# Patient Record
Sex: Female | Born: 1955
Health system: Southern US, Community
[De-identification: ages and names within clinical notes are randomized; demographics above are authoritative.]

## PROBLEM LIST (undated history)

## (undated) DIAGNOSIS — M1990 Unspecified osteoarthritis, unspecified site: Secondary | ICD-10-CM

## (undated) DIAGNOSIS — K635 Polyp of colon: Secondary | ICD-10-CM

## (undated) DIAGNOSIS — I10 Essential (primary) hypertension: Secondary | ICD-10-CM

## (undated) DIAGNOSIS — Z8041 Family history of malignant neoplasm of ovary: Secondary | ICD-10-CM

## (undated) HISTORY — DX: Essential (primary) hypertension: I10

## (undated) HISTORY — PX: OTHER SURGICAL HISTORY: SHX169

## (undated) HISTORY — PX: GALLBLADDER SURGERY: SHX652

## (undated) HISTORY — DX: Family history of malignant neoplasm of ovary: Z80.41

## (undated) HISTORY — PX: CHOLECYSTECTOMY: SHX55

---

## 2004-08-16 ENCOUNTER — Ambulatory Visit: Payer: Self-pay

## 2005-06-09 HISTORY — PX: COLONOSCOPY: SHX174

## 2005-08-29 ENCOUNTER — Ambulatory Visit: Payer: Self-pay

## 2006-08-31 ENCOUNTER — Ambulatory Visit: Payer: Self-pay

## 2006-10-05 ENCOUNTER — Ambulatory Visit: Payer: Self-pay | Admitting: Gastroenterology

## 2006-10-05 LAB — HM COLONOSCOPY: HM Colonoscopy: NORMAL

## 2007-09-02 ENCOUNTER — Ambulatory Visit: Payer: Self-pay

## 2008-09-04 ENCOUNTER — Ambulatory Visit: Payer: Self-pay

## 2009-09-05 ENCOUNTER — Ambulatory Visit: Payer: Self-pay

## 2009-09-06 ENCOUNTER — Ambulatory Visit: Payer: Self-pay

## 2010-09-09 ENCOUNTER — Ambulatory Visit: Payer: Self-pay

## 2011-09-03 ENCOUNTER — Ambulatory Visit: Payer: Self-pay | Admitting: Family Medicine

## 2011-09-03 LAB — URINALYSIS, COMPLETE
Blood: NEGATIVE
Glucose,UR: NEGATIVE mg/dL (ref 0–75)
Ketone: NEGATIVE
Leukocyte Esterase: NEGATIVE
Nitrite: NEGATIVE
Ph: 5 (ref 4.5–8.0)
Specific Gravity: 1.025 (ref 1.003–1.030)

## 2011-09-05 ENCOUNTER — Ambulatory Visit: Payer: Self-pay | Admitting: Family Medicine

## 2011-09-05 LAB — HEPATIC FUNCTION PANEL A (ARMC)
Albumin: 4.2 g/dL (ref 3.4–5.0)
Alkaline Phosphatase: 95 U/L (ref 50–136)
Bilirubin, Direct: 0.1 mg/dL (ref 0.00–0.20)
Bilirubin,Total: 0.4 mg/dL (ref 0.2–1.0)
SGOT(AST): 16 U/L (ref 15–37)
SGPT (ALT): 22 U/L
Total Protein: 7.7 g/dL (ref 6.4–8.2)

## 2011-09-05 LAB — CBC WITH DIFFERENTIAL/PLATELET
Basophil #: 0 10*3/uL (ref 0.0–0.1)
Basophil %: 0.6 %
HCT: 39.7 % (ref 35.0–47.0)
Lymphocyte %: 35.3 %
MCH: 26.8 pg (ref 26.0–34.0)
Monocyte #: 0.4 10*3/uL (ref 0.0–0.7)
Neutrophil %: 54.3 %
RBC: 4.87 10*6/uL (ref 3.80–5.20)

## 2011-09-06 ENCOUNTER — Observation Stay: Payer: Self-pay | Admitting: Surgery

## 2011-09-06 LAB — CBC
HGB: 12.8 g/dL (ref 12.0–16.0)
MCH: 27.2 pg (ref 26.0–34.0)
MCHC: 33.2 g/dL (ref 32.0–36.0)
MCV: 82 fL (ref 80–100)
Platelet: 359 10*3/uL (ref 150–440)
RBC: 4.71 10*6/uL (ref 3.80–5.20)

## 2011-09-06 LAB — URINALYSIS, COMPLETE
Bilirubin,UR: NEGATIVE
Glucose,UR: NEGATIVE mg/dL (ref 0–75)
Ketone: NEGATIVE
Leukocyte Esterase: NEGATIVE
Nitrite: NEGATIVE
Protein: NEGATIVE
RBC,UR: <1 /HPF (ref 0–5)
Specific Gravity: 1.031 (ref 1.003–1.030)
Squamous Epithelial: 2
WBC UR: <1 /HPF (ref 0–5)

## 2011-09-06 LAB — COMPREHENSIVE METABOLIC PANEL
Albumin: 4.2 g/dL (ref 3.4–5.0)
Alkaline Phosphatase: 76 U/L (ref 50–136)
Bilirubin,Total: 0.2 mg/dL (ref 0.2–1.0)
Calcium, Total: 9.3 mg/dL (ref 8.5–10.1)
Chloride: 106 mmol/L (ref 98–107)
Co2: 26 mmol/L (ref 21–32)
Creatinine: 0.79 mg/dL (ref 0.60–1.30)
EGFR (African American): >60
Glucose: 97 mg/dL (ref 65–99)
SGOT(AST): 18 U/L (ref 15–37)
Sodium: 142 mmol/L (ref 136–145)

## 2011-09-06 LAB — LIPASE, BLOOD: Lipase: 80 U/L (ref 73–393)

## 2011-09-06 LAB — URINE CULTURE

## 2011-09-08 LAB — COMPREHENSIVE METABOLIC PANEL
Albumin: 3 g/dL — ABNORMAL LOW (ref 3.4–5.0)
Anion Gap: 7 (ref 7–16)
Bilirubin,Total: 0.3 mg/dL (ref 0.2–1.0)
Calcium, Total: 8.6 mg/dL (ref 8.5–10.1)
Chloride: 105 mmol/L (ref 98–107)
Co2: 28 mmol/L (ref 21–32)
EGFR (African American): >60
EGFR (Non-African Amer.): >60
Glucose: 74 mg/dL (ref 65–99)
SGOT(AST): 25 U/L (ref 15–37)
SGPT (ALT): 20 U/L
Sodium: 140 mmol/L (ref 136–145)
Total Protein: 5.9 g/dL — ABNORMAL LOW (ref 6.4–8.2)

## 2011-09-08 LAB — CBC WITH DIFFERENTIAL/PLATELET
Basophil %: 0.5 %
Eosinophil #: 0.3 10*3/uL (ref 0.0–0.7)
Eosinophil %: 4.8 %
Lymphocyte #: 2.2 10*3/uL (ref 1.0–3.6)
Lymphocyte %: 38.5 %
MCHC: 32.4 g/dL (ref 32.0–36.0)
MCV: 83 fL (ref 80–100)
Monocyte #: 0.6 10*3/uL (ref 0.0–0.7)

## 2011-09-09 LAB — PATHOLOGY REPORT

## 2011-09-16 ENCOUNTER — Ambulatory Visit: Payer: Self-pay

## 2012-06-22 ENCOUNTER — Emergency Department: Payer: Self-pay | Admitting: Emergency Medicine

## 2012-06-22 LAB — URINALYSIS, COMPLETE
Blood: NEGATIVE
Glucose,UR: NEGATIVE mg/dL (ref 0–75)
Granular Cast: 1
Leukocyte Esterase: NEGATIVE
Nitrite: NEGATIVE
Ph: 6 (ref 4.5–8.0)
Protein: 30
RBC,UR: 1 /HPF (ref 0–5)
WBC UR: 6 /HPF (ref 0–5)

## 2012-06-22 LAB — COMPREHENSIVE METABOLIC PANEL
Albumin: 4.1 g/dL (ref 3.4–5.0)
Alkaline Phosphatase: 96 U/L (ref 50–136)
Anion Gap: 8 (ref 7–16)
Bilirubin,Total: 0.3 mg/dL (ref 0.2–1.0)
Calcium, Total: 9.3 mg/dL (ref 8.5–10.1)
Creatinine: 0.79 mg/dL (ref 0.60–1.30)
EGFR (Non-African Amer.): >60
Potassium: 3.8 mmol/L (ref 3.5–5.1)
SGOT(AST): 61 U/L — ABNORMAL HIGH (ref 15–37)
SGPT (ALT): 63 U/L (ref 12–78)
Sodium: 139 mmol/L (ref 136–145)
Total Protein: 8.2 g/dL (ref 6.4–8.2)

## 2012-06-22 LAB — TROPONIN I: Troponin-I: <0.02 ng/mL

## 2012-06-22 LAB — CBC
HCT: 44.9 % (ref 35.0–47.0)
MCH: 26.8 pg (ref 26.0–34.0)
MCHC: 33.5 g/dL (ref 32.0–36.0)
MCV: 80 fL (ref 80–100)
RBC: 5.61 10*6/uL — ABNORMAL HIGH (ref 3.80–5.20)
WBC: 5.8 10*3/uL (ref 3.6–11.0)

## 2012-09-16 ENCOUNTER — Ambulatory Visit: Payer: Self-pay

## 2013-09-19 ENCOUNTER — Ambulatory Visit: Payer: Self-pay

## 2013-10-17 ENCOUNTER — Emergency Department: Payer: Self-pay | Admitting: Emergency Medicine

## 2013-10-17 LAB — CBC WITH DIFFERENTIAL/PLATELET
BASOS ABS: 0 10*3/uL (ref 0.0–0.1)
Basophil %: 0.2 %
EOS ABS: 0.1 10*3/uL (ref 0.0–0.7)
Eosinophil %: 0.4 %
HCT: 39.3 % (ref 35.0–47.0)
HGB: 13 g/dL (ref 12.0–16.0)
LYMPHS ABS: 1.3 10*3/uL (ref 1.0–3.6)
Lymphocyte %: 10.2 %
MCH: 27 pg (ref 26.0–34.0)
MCHC: 33 g/dL (ref 32.0–36.0)
MCV: 82 fL (ref 80–100)
Monocyte #: 0.7 x10 3/mm (ref 0.2–0.9)
Monocyte %: 5.7 %
NEUTROS ABS: 10.4 10*3/uL — AB (ref 1.4–6.5)
NEUTROS PCT: 83.5 %
Platelet: 400 10*3/uL (ref 150–440)
RBC: 4.81 10*6/uL (ref 3.80–5.20)
RDW: 13.9 % (ref 11.5–14.5)
WBC: 12.4 10*3/uL — AB (ref 3.6–11.0)

## 2013-10-17 LAB — URINALYSIS, COMPLETE
BILIRUBIN, UR: NEGATIVE
Blood: NEGATIVE
GLUCOSE, UR: NEGATIVE mg/dL (ref 0–75)
Ketone: NEGATIVE
LEUKOCYTE ESTERASE: NEGATIVE
NITRITE: NEGATIVE
PH: 6 (ref 4.5–8.0)
PROTEIN: NEGATIVE
RBC, UR: NONE SEEN /HPF (ref 0–5)
SPECIFIC GRAVITY: 1.03 (ref 1.003–1.030)

## 2013-10-17 LAB — COMPREHENSIVE METABOLIC PANEL
ALBUMIN: 4.2 g/dL (ref 3.4–5.0)
ALK PHOS: 82 U/L
ANION GAP: 5 — AB (ref 7–16)
BUN: 19 mg/dL — ABNORMAL HIGH (ref 7–18)
Bilirubin,Total: 0.3 mg/dL (ref 0.2–1.0)
CHLORIDE: 107 mmol/L (ref 98–107)
CREATININE: 0.81 mg/dL (ref 0.60–1.30)
Calcium, Total: 9.2 mg/dL (ref 8.5–10.1)
Co2: 27 mmol/L (ref 21–32)
EGFR (African American): >60
GLUCOSE: 101 mg/dL — AB (ref 65–99)
Osmolality: 280 (ref 275–301)
Potassium: 4 mmol/L (ref 3.5–5.1)
SGOT(AST): 25 U/L (ref 15–37)
SGPT (ALT): 24 U/L (ref 12–78)
Sodium: 139 mmol/L (ref 136–145)
TOTAL PROTEIN: 7.8 g/dL (ref 6.4–8.2)

## 2013-12-01 DIAGNOSIS — M2390 Unspecified internal derangement of unspecified knee: Secondary | ICD-10-CM | POA: Insufficient documentation

## 2013-12-01 DIAGNOSIS — M1710 Unilateral primary osteoarthritis, unspecified knee: Secondary | ICD-10-CM | POA: Insufficient documentation

## 2013-12-01 DIAGNOSIS — M179 Osteoarthritis of knee, unspecified: Secondary | ICD-10-CM | POA: Insufficient documentation

## 2013-12-01 HISTORY — DX: Unspecified internal derangement of unspecified knee: M23.90

## 2013-12-13 ENCOUNTER — Ambulatory Visit: Payer: Self-pay | Admitting: Orthopedic Surgery

## 2013-12-28 LAB — LIPID PANEL
Cholesterol: 186 mg/dL (ref 0–200)
HDL: 59 mg/dL (ref 35–70)
LDL CALC: 114 mg/dL
TRIGLYCERIDES: 66 mg/dL (ref 40–160)

## 2013-12-28 LAB — HEMOGLOBIN A1C: HEMOGLOBIN A1C: 5.8 % (ref 4.0–6.0)

## 2013-12-28 LAB — TSH: TSH: 2.3 u[IU]/mL (ref <=5.90)

## 2014-02-21 ENCOUNTER — Ambulatory Visit: Payer: Self-pay | Admitting: Anesthesiology

## 2014-02-21 DIAGNOSIS — I10 Essential (primary) hypertension: Secondary | ICD-10-CM

## 2014-02-21 DIAGNOSIS — Z01810 Encounter for preprocedural cardiovascular examination: Secondary | ICD-10-CM

## 2014-02-27 ENCOUNTER — Ambulatory Visit: Payer: Self-pay | Admitting: General Practice

## 2014-07-03 ENCOUNTER — Ambulatory Visit: Payer: Self-pay | Admitting: Family Medicine

## 2014-07-03 LAB — RENAL FUNCTION PANEL
ANION GAP: 7 (ref 7–16)
Albumin: 4.2 g/dL (ref 3.4–5.0)
BUN: 14 mg/dL (ref 7–18)
Calcium, Total: 9.7 mg/dL (ref 8.5–10.1)
Chloride: 105 mmol/L (ref 98–107)
Co2: 28 mmol/L (ref 21–32)
Creatinine: 0.88 mg/dL (ref 0.60–1.30)
EGFR (African American): >60
EGFR (Non-African Amer.): >60
GLUCOSE: 103 mg/dL — AB (ref 65–99)
OSMOLALITY: 280 (ref 275–301)
POTASSIUM: 4.4 mmol/L (ref 3.5–5.1)
Phosphorus: 3.7 mg/dL (ref 2.5–4.9)
SODIUM: 140 mmol/L (ref 136–145)

## 2014-07-03 LAB — BASIC METABOLIC PANEL
BUN: 14 mg/dL (ref 4–21)
CREATININE: 0.9 mg/dL (ref <=1.1)
Glucose: 103 mg/dL

## 2014-07-03 LAB — LIPID PANEL
Cholesterol: 196 mg/dL (ref 0–200)
HDL: 53 mg/dL (ref 40–60)
LDL CHOLESTEROL, CALC: 125 mg/dL — AB (ref 0–100)
Triglycerides: 90 mg/dL (ref 0–200)
VLDL Cholesterol, Calc: 18 mg/dL (ref 5–40)

## 2014-09-21 ENCOUNTER — Ambulatory Visit
Admit: 2014-09-21 | Disposition: A | Discharge status: Home / Self Care | Payer: Self-pay | Attending: Obstetrics and Gynecology | Admitting: Obstetrics and Gynecology

## 2014-09-30 NOTE — Op Note (Signed)
PATIENT NAME:  Shannon Blake, Shannon Blake MR#:  [id number] DATE OF BIRTH:  May 06, 1956  DATE OF PROCEDURE:  02/27/2014  PREOPERATIVE DIAGNOSIS: Internal derangement of the left knee.   POSTOPERATIVE DIAGNOSES:  1.  Tear of the posterior horn, medial meniscus, left knee.  2.  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   PROCEDURE PERFORMED: Left knee arthroscopy, partial medial meniscectomy, and chondroplasty.   SURGEON: Laurice Record. Holley Bouche., MD   ANESTHESIA: General.   ESTIMATED BLOOD LOSS: Minimal.  TOURNIQUET TIME: Not used.   DRAINS: None.  INDICATIONS FOR SURGERY: The patient is a 59 year old female who has been seen for complaints of persistent left knee pain and swelling. MRI demonstrated findings consistent with meniscal pathology. After discussion of the risks and benefits of surgical intervention, the patient expressed understanding of the risks, benefits, and agreed with plans for surgical intervention.   PROCEDURE IN DETAIL: The patient was brought to the operating room and, after adequate general anesthesia was achieved, a tourniquet was placed on the patient's left thigh and the leg was placed in a leg holder. All bony prominences were well padded. The patient's left knee and leg were cleaned and prepped with alcohol and DuraPrep, draped in the usual sterile fashion. A "timeout" was performed as per usual protocol. The anticipated portal sites were injected with 0.25% Marcaine with epinephrine. An anterolateral portal was created and a cannula was inserted. A small effusion was evacuated. The scope was inserted and the knee was distended with fluid using the pump. The scope was advanced down the medial gutter and into the medial compartment of the knee. Under visualization with the scope, an anteromedial portal was created and a hook probe was inserted. Inspection of the medial compartment demonstrated a complex tear of the posterior horn of the medial meniscus.  This was debrided using meniscal punches and a 4.5 mm shaver. Final contouring was performed using a 50-degree ArthroCare wand. The remaining rim of meniscus was probed and felt to be stable. The anterior horn was visualized and probed and felt to be stable. There were grade 3 changes of chondromalacia involving primarily the medial femoral condyle with lesser changes noted to the medial tibial plateau. These areas were debrided and contoured using the 50-degree ArthroCare wand. Several moderate-sized chondral loose bodies were evacuated as well. The scope was then advanced into the intracondylar region. The anterior cruciate ligament was visualized and probed and felt to be stable. The scope was then advanced into the lateral compartment. The articular surface was in excellent condition. The lateral meniscus was visualized and probed and felt to be stable. Finally, the scope was positioned so as to visualize the patellofemoral articulation. There were grade 2 to 3 changes of chondromalacia involving the intercondylar groove and patella. These areas were debrided using the ArthroCare wand.   The knee was irrigated with copious amounts of fluid and then suctioned dry. The anteromedial portal was reapproximated using #3-0 nylon, a combination of 0.25% Marcaine with epinephrine and 4 mg of morphine. The scope was removed and the anterolateral portal was reapproximated using #3-0 nylon. A sterile dressing was applied followed by application of an ice wrap.  The patient tolerated the procedure well. She was transported to the recovery room in stable condition.   ____________________________ Laurice Record. Holley Bouche., MD jph:ST D: 02/27/2014 23:55:73 ET T: 02/27/2014 23:27:27 ET JOB#: [id number]  cc: Laurice Record. Holley Bouche., MD, <Dictator> JAMES P Holley Bouche MD ELECTRONICALLY SIGNED 03/06/2014 7:02

## 2014-10-01 NOTE — Op Note (Signed)
PATIENT NAME:  Shannon Blake, ARENS MR#:  [id number] DATE OF BIRTH:  1956/06/02  DATE OF PROCEDURE:  09/07/2011  PREOPERATIVE DIAGNOSIS: Acute cholecystitis.   POSTOPERATIVE DIAGNOSIS: Acute cholecystitis.   PROCEDURE: Laparoscopic cholecystectomy.   SURGEON: Phoebe Perch, M.D.   ANESTHESIA: General with endotracheal tube.   INDICATIONS: This is a patient with unrelenting right upper quadrant pain and work-up showing gallstones. Preoperatively we discussed rationale for surgery, the options of observation, the risk of bleeding, infection, recurrence of symptoms, failure to resolve her symptoms, open procedure, bile duct damage, bile duct leak, retained common bile duct stone or bowel injury, any of which could require further surgery and/or ERCP, stent, and papillotomy. This was all reviewed for her and her family multiple times. They understood and agreed to proceed.   FINDINGS: Acute cholecystitis with edematous gallbladder, small cystic duct with a faceted stone impacted in the infundibulum of the gallbladder.   DESCRIPTION OF PROCEDURE: The patient was induced to general anesthesia, given IV antibiotics. VTE prophylaxis was in place. She was prepped and draped in a sterile fashion. Marcaine was infiltrated in the skin and subcutaneous tissues around the periumbilical area. Incision was made. Veress needle was placed. Pneumoperitoneum was obtained. A 5-mm trocar port was placed. The abdominal cavity was explored and under direct vision a 10-mm epigastric port and two lateral 5-mm ports were placed. The gallbladder was identified and found to be encased in adhesions, which were taken down bluntly without the use of energy.   Peritoneum over the infundibulum was incised bluntly. The cystic duct/gallbladder junction was well identified. The faceted gallstone was visualized impacted in the infundibulum of the gallbladder with a small cystic duct emanating just distal to the stone. This was dissected out  and doubly clipped and divided and the cystic artery was doubly clipped and divided in two branches. The gallbladder was taken from the gallbladder fossa with electrocautery and passed out through the epigastric port site with the aid of an Endo Catch bag. The incision itself required enlargement at this site to due to the size of the gallbladder and stones.   The area was checked for hemostasis and irrigated with copious amounts of normal saline. There was no sign of bleeding, bile leak, or bowel injury. The camera was placed in the epigastric site to view back to the periumbilical site. There was no sign of adhesions or bowel injury. Therefore, pneumoperitoneum was released. All ports were removed. Fascial edges at the epigastric site were approximated with multiple figure-of-eight 0 Vicryls. 4-0 subcuticular Monocryl was used on all skin edges. Steri-Strips, Mastisol, and sterile dressings were placed.   The patient tolerated the procedure well. There were no complications. She was taken to the recovery room in stable condition to be admitted for continued care.     ____________________________ Jerrol Banana. Burt Knack, MD rec:bjt D: 09/07/2011 09:40:14 ET T: 09/07/2011 12:25:48 ET JOB#: [id number]  cc: Jerrol Banana. Burt Knack, MD, <Dictator> Florene Glen MD ELECTRONICALLY SIGNED 09/07/2011 15:49

## 2014-10-01 NOTE — H&P (Signed)
Subjective/Chief Complaint rt flank pain    History of Present Illness 5 d rt flank pain. I discussed potential ER visit with Dr Ronnald Ramp yesterday. 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    Place of Living Home   Review of Systems:   Fever/Chills No    Cough No    Abdominal Pain Yes    Diarrhea No    Constipation No    Nausea/Vomiting Yes    SOB/DOE No    Chest Pain No    Dysuria No    Tolerating Diet Yes  Nauseated   Physical Exam:   GEN NAD, obese    HEENT pink conjunctivae    NECK supple    RESP normal resp effort  clear BS    CARD regular rate    ABD positive tenderness  soft  RUQ and rt flank tenderness, pos Murphy's sign    LYMPH negative neck    EXTR negative edema    SKIN normal to palpation    PSYCH alert, A+O to time, place, person, good insight   Routine Hem:  30-Mar-13 06:42    WBC (CBC) 7.8   RBC (CBC) 4.71   Hemoglobin (CBC) 12.8   Hematocrit (CBC) 38.6   Platelet Count (CBC) 359   MCV 82   MCH 27.2   MCHC 33.2   RDW 13.6  Routine Chem:  30-Mar-13 06:42    Glucose, Serum 97   BUN 20   Creatinine (comp) 0.79   Sodium, Serum 142   Potassium, Serum 4.2   Chloride, Serum 106   CO2, Serum 26   Calcium (Total), Serum 9.3  Hepatic:  30-Mar-13 06:42    Bilirubin, Total 0.2   Alkaline Phosphatase 76   SGPT (ALT) 21   SGOT (AST) 18   Total Protein, Serum 7.6   Albumin, Serum 4.2  Routine Chem:  30-Mar-13 06:42    Osmolality (calc) 286   eGFR (African American) >60   eGFR (Non-African American) >60   Anion Gap 10   Lipase 80  Routine Coag:  30-Mar-13 06:42    D-Dimer, Quantitative < 0.22   Radiology Results: XRay:   27-Mar-13 14:48, Ribs Right Unilateral ([place])   Ribs Right Unilateral ([place])   REASON FOR EXAM:    pain lateral ribs  COMMENTS:   LMP: Post-Menopausal    PROCEDURE: MDR - MDR RIBS RIGHT UNLILATERAL  - Sep 03 2011  2:48PM     RESULT: Three views of the right ribs were obtained. A metallic marker   was placed at the site of maximum symptomatology prior to radiography. No   rib fracture or other acute bony abnormality is identified.    IMPRESSION: No significant osseous abnormalities are identified.      Thank you for this opportunity to contribute to the care of your patient.        Verified By: Dionne Ano WALL, M.D., MD  [place]:    30-Mar-13 07:15, US Abdomen Limited Survey   US Abdomen Limited Survey   REASON FOR EXAM:    ruq pain  COMMENTS:   May transport without cardiac monitor  PROCEDURE: [place]  - US ABDOMEN LIMITED SURVEY  - Sep 06 2011  7:15AM     RESULT: Limited right upper quadrant emergent abdominal sonogram is   performed. The visualized portions of the pancreas appear unremarkable.   Cholelithiasis is evident. There is a wall echo shadow in sign consistent   with the gallbladder being filled with stones. No sonographic Murphy's   sign is evident. The portal venous flow is normal. Common bile duct   diameter is up to 3.2 mm in measurements. The gallbladder wall thickness   is measuring 2.4 to 2.5 mm diameter. In one area that measurement is 1.8   mm. No definite pericholecystic fluid is identified.    IMPRESSION:   1. Cholelithiasis. No definite evidence of biliary obstruction or acute   cholecystitis.          Verified By: Sundra Aland, M.D., MD  CT:    29-Mar-13 09:13, CT Abdomen Pelvis WO for Stone   CT Abdomen Pelvis WO for Stone   REASON FOR EXAM:    , right flank pain  COMMENTS:       PROCEDURE: CT  - CT ABDOMEN /PELVIS WO Joaquim Lai)  - Sep 05 2011  9:13AM     RESULT: History: Stone disease.    Comparison Study: No recent.    Findings: Standard  nonenhanced CT obtained. Mild atelectasis present in   the lung bases. No free air. Liver normal. Gallstones are present. There   is no gallbladder wall thickening or evidence of cholecystitis. No   significant biliary ductal distention. Pancreas is normal. Spleen is   normal. Adrenals normal. Small phleboliths. No obstructing ureteral   stone. Nonobstructing distal ureteral stone the right cannot be     completely excluded. There is no hydronephrosis. Kidneys are   unremarkable. Aorta nondistended. No inflammatory changes are noted the   right or left lower quadrants. There is no bowel distention.    IMPRESSION:   1. Gallstones. No CT evidence of cholecystitis.  2. No  obstructing ureteral stones noted. Phleboliths are noted in the   right lower pelvis. Nonobstructing tiny sand-like stone in the distal   ureter on the right cannot be entirely excluded but again no evidence of   hydronephrosis or hydroureter.          Verified By: Osa Craver, M.D., MD     Assessment/Admission Diagnosis ac cholecystitis rec admit, hydrate abx pain control lap chole later today or tomorrow disc options rationale and risks agrees with plan   Electronic Signatures: Florene Glen (MD)  (Signed 30-Mar-13 08:16)  Authored: CHIEF COMPLAINT and HISTORY, PAST MEDICAL/SURGIAL HISTORY, ALLERGIES, FAMILY AND SOCIAL HISTORY, REVIEW OF SYSTEMS, PHYSICAL EXAM, LABS, Radiology, ASSESSMENT AND PLAN   Last Updated: 30-Mar-13 08:16 by Florene Glen (MD)

## 2014-10-01 NOTE — Discharge Summary (Signed)
PATIENT NAME:  Shannon Blake, Shannon Blake MR#:  [id number] DATE OF BIRTH:  04/10/1956  DATE OF ADMISSION:  09/06/2011 DATE OF DISCHARGE:  09/08/2011  DISCHARGE DIAGNOSES:  1. Acute cholecystitis with cholelithiasis. 2. Hypertension.   PROCEDURES: Laparoscopic cholecystectomy.   HISTORY OF PRESENT ILLNESS AND HOSPITAL COURSE: This is a patient who was admitted to the hospital with multiple episodes of right upper quadrant pain. She is a patient of Dr. Otilio Miu who I had spoken to personally concerning this patient's care. She was admitted to the hospital with recurrent episodes of epigastric and right upper quadrant pain associated with fatty food intolerance and work-up showing gallstones. She was taken to the operating room where laparoscopic cholecystectomy was performed. Acute cholecystitis was identified. She was treated with IV antibiotics, was tolerating a regular diet, to be discharged in stable condition with oral analgesics, and to follow up in my office in 10 days. She is instructed to shower and follow up as needed in the office in 10 days or sooner. ____________________________ Jerrol Banana Burt Knack, MD rec:slb D: 09/23/2011 00:04:00 ET T: 09/23/2011 16:14:59 ET JOB#: [id number]  cc: Jerrol Banana. Burt Knack, MD, <Dictator> Florene Glen MD ELECTRONICALLY SIGNED 09/23/2011 19:33

## 2014-10-01 NOTE — H&P (Signed)
PATIENT NAME:  Shannon Blake, Shannon Blake MR#:  [id number] DATE OF BIRTH:  [birth year]  DATE OF ADMISSION:  09/06/2011  CHIEF COMPLAINT: Right flank pain.   HISTORY OF PRESENT ILLNESS: This is a patient who I spoke to Dr. Otilio Miu about yesterday. Dr. Ronnald Ramp called me suggesting that the patient might have acute cholecystitis after five days of abdominal pain and a thorough work-up which included CT, labs, and rib films that only suggested the presence of gallstones as a possible cause.   The patient came to the Emergency Room because her pain was unrelenting all night and I was asked to see the patient in consultation. She describes ongoing pain for 5 to 6 days in the right flank. No back pain. Some nausea. No vomiting. No jaundice. No acholic stools. No fevers or chills. No melena or hematochezia and no dysuria or hematuria.   PAST MEDICAL HISTORY: Hypertension. Denies stroke, diabetes, MI, or tobacco abuse.   PAST SURGICAL HISTORY: None.   ALLERGIES: None.   MEDICATIONS:  1. Unknown antihypertensive. 2. Cipro.   FAMILY HISTORY: Noncontributory.   SOCIAL HISTORY: The patient works as a Quarry manager at a nursing home. She does not smoke or drink.   REVIEW OF SYSTEMS: 10 system review has been performed and negative with the exception of that mentioned in the history of present illness.   PHYSICAL EXAMINATION:   GENERAL: Morbidly obese female patient with a BMI of 43, 62 inches tall, 234 pounds.   HEENT: No scleral icterus.   NECK: No palpable neck nodes.   CHEST: Clear to auscultation.   CARDIAC: Regular rate and rhythm.   ABDOMEN: Tender in the right upper quadrant and right flank, maximally in the right upper quadrant, and a positive Murphy's sign is present.   EXTREMITIES: Without edema. Calves are nontender.   NEUROLOGIC: Grossly intact.   INTEGUMENTARY: No jaundice.   LABORATORY, DIAGNOSTIC, AND RADIOLOGICAL DATA: Lipase 80. An ultrasound shows gallstones without thickened gallbladder  wall. Liver function tests are within normal limits. Creatinine 0.79. White blood cell count 7.8. Hemoglobin and hematocrit 13 and 39. Platelet count 359.   ASSESSMENT AND PLAN: This is a patient with biliary colic and probable acute cholecystitis in spite of the lack of significant findings on ultrasound. She is quite tender in the right upper quadrant. I have recommended admitting the patient to the hospital and re-examining her after controlling her pain and nausea and hydrating her. I will start antibiotics as she had been on Cipro previously. I will utilize amp/sulbactam. The rationale for offering surgery at some point was discussed with she and her family members. The options were reviewed and the risks of bleeding, infection, failure to resolve her symptoms, open procedure, bile duct damage, bile duct leak, retained common bile duct stone, any of which could require further surgery and/or ERCP, stent, and papillotomy were all discussed with she and her family. They understood and agreed to proceed. A few questions were answered for them.   ____________________________ Jerrol Banana. Burt Knack, MD rec:drc D: 09/06/2011 08:20:37 ET T: 09/06/2011 10:22:17 ET JOB#: [id number] cc: Jerrol Banana. Burt Knack, MD, <Dictator> Florene Glen MD ELECTRONICALLY SIGNED 09/06/2011 12:24

## 2014-10-17 DIAGNOSIS — R059 Cough, unspecified: Secondary | ICD-10-CM | POA: Insufficient documentation

## 2014-10-17 DIAGNOSIS — N200 Calculus of kidney: Secondary | ICD-10-CM | POA: Insufficient documentation

## 2014-10-17 DIAGNOSIS — E669 Obesity, unspecified: Secondary | ICD-10-CM

## 2014-10-17 DIAGNOSIS — I10 Essential (primary) hypertension: Secondary | ICD-10-CM

## 2014-10-17 DIAGNOSIS — E782 Mixed hyperlipidemia: Secondary | ICD-10-CM

## 2014-10-17 DIAGNOSIS — R05 Cough: Secondary | ICD-10-CM | POA: Insufficient documentation

## 2014-10-17 HISTORY — DX: Mixed hyperlipidemia: E78.2

## 2014-10-17 HISTORY — DX: Obesity, unspecified: E66.9

## 2014-10-17 HISTORY — DX: Essential (primary) hypertension: I10

## 2014-10-17 HISTORY — DX: Calculus of kidney: N20.0

## 2014-12-29 ENCOUNTER — Encounter: Payer: Self-pay | Admitting: Family Medicine

## 2014-12-29 ENCOUNTER — Other Ambulatory Visit: Payer: Self-pay

## 2014-12-29 ENCOUNTER — Ambulatory Visit (INDEPENDENT_AMBULATORY_CARE_PROVIDER_SITE_OTHER): Payer: BLUE CROSS/BLUE SHIELD | Admitting: Family Medicine

## 2014-12-29 VITALS — BP 119/72 | HR 69 | Resp 16 | Ht 62.0 in | Wt 232.4 lb

## 2014-12-29 DIAGNOSIS — I10 Essential (primary) hypertension: Secondary | ICD-10-CM

## 2014-12-29 LAB — POCT URINALYSIS DIPSTICK
BILIRUBIN UA: NEGATIVE
Blood, UA: NEGATIVE
Glucose, UA: NEGATIVE
KETONES UA: NEGATIVE
Leukocytes, UA: NEGATIVE
NITRITE UA: NEGATIVE
PH UA: 6
Protein, UA: NEGATIVE
UROBILINOGEN UA: NEGATIVE

## 2014-12-29 MED ORDER — AMLODIPINE BESY-BENAZEPRIL HCL 5-10 MG PO CAPS: 1.0000 | ORAL_CAPSULE | Freq: Every day | ORAL | Status: DC

## 2014-12-29 NOTE — Progress Notes (Signed)
Name: Shannon Blake   MRN: [id number]    DOB: Mar 13, 1956   Date:12/29/2014       Progress Note  Subjective  Chief Complaint  Chief Complaint  Patient presents with  . Follow-up    Hypertension     Hypertension This is a chronic problem. The current episode started more than 1 year ago. The problem has been gradually improving since onset. The problem is controlled. Pertinent negatives include no anxiety, blurred vision, chest pain, headaches, malaise/fatigue, neck pain, orthopnea, palpitations, peripheral edema, PND, shortness of breath or sweats. Agents associated with hypertension include NSAIDs. Risk factors for coronary artery disease include obesity and stress. Past treatments include calcium channel blockers and angiotensin blockers. The current treatment provides mild improvement. There are no compliance problems.  There is no history of angina, kidney disease, CAD/MI, CVA, heart failure, left ventricular hypertrophy, PVD or retinopathy. There is no history of chronic renal disease.    No problem-specific assessment & plan notes found for this encounter.   No past medical history on file.  No past surgical history on file.  No family history on file.  History   Social History  . Marital Status: Single    Spouse Name: N/A  . Number of Children: N/A  . Years of Education: N/A   Occupational History  . Not on file.   Social History Main Topics  . Smoking status: Never Smoker   . Smokeless tobacco: Not on file  . Alcohol Use: No  . Drug Use: Not on file  . Sexual Activity: Not on file   Other Topics Concern  . Not on file   Social History Narrative    Allergies  Allergen Reactions  . Penicillins      Review of Systems  Constitutional: Negative for fever, chills, weight loss and malaise/fatigue.  HENT: Negative for ear discharge, ear pain and sore throat.   Eyes: Negative for blurred vision.  Respiratory: Negative for cough, sputum production,  shortness of breath and wheezing.   Cardiovascular: Negative for chest pain, palpitations, orthopnea, leg swelling and PND.  Gastrointestinal: Negative for heartburn, nausea, abdominal pain, diarrhea, constipation, blood in stool and melena.  Genitourinary: Negative for dysuria, urgency, frequency and hematuria.  Musculoskeletal: Negative for myalgias, back pain, joint pain and neck pain.  Skin: Negative for rash.  Neurological: Negative for dizziness, tingling, sensory change, focal weakness and headaches.  Endo/Heme/Allergies: Negative for environmental allergies and polydipsia. Does not bruise/bleed easily.  Psychiatric/Behavioral: Negative for depression and suicidal ideas. The patient is not nervous/anxious and does not have insomnia.      Objective  Filed Vitals:   12/29/14 0857  BP: 119/72  Pulse: 69  Resp: 16  Height: 5\' 2"  (1.575 m)  Weight: 232 lb 6.4 oz (105.416 kg)  SpO2: 98%    Physical Exam  Constitutional: She is well-developed, well-nourished, and in no distress. No distress.  HENT:  Head: Normocephalic and atraumatic.  Right Ear: External ear normal.  Left Ear: External ear normal.  Nose: Nose normal.  Mouth/Throat: Oropharynx is clear and moist.  Eyes: Conjunctivae and EOM are normal. Pupils are equal, round, and reactive to light. Right eye exhibits no discharge. Left eye exhibits no discharge.  Neck: Normal range of motion. Neck supple. No JVD present. No thyromegaly present.  Cardiovascular: Normal rate, regular rhythm, normal heart sounds and intact distal pulses.  Exam reveals no gallop and no friction rub.   No murmur heard. Pulmonary/Chest: Effort normal and breath sounds normal.  Abdominal: Soft. Bowel sounds are normal. She exhibits no mass. There is no tenderness. There is no guarding.  Musculoskeletal: Normal range of motion. She exhibits no edema.  Lymphadenopathy:    She has no cervical adenopathy.  Neurological: She is alert. She has normal  reflexes.  Skin: Skin is warm and dry. She is not diaphoretic.  Psychiatric: Mood and affect normal.  Nursing note and vitals reviewed.     Assessment & Plan  Problem List Items Addressed This Visit      Cardiovascular and Mediastinum   Essential (primary) hypertension - Primary   Relevant Medications   aspirin 81 MG chewable tablet   amLODipine-benazepril (LOTREL) 5-10 MG per capsule   Other Relevant Orders   Renal Function Panel   POCT Urinalysis Dipstick        Dr. Otilio Miu [facility]  12/29/2014

## 2014-12-30 LAB — RENAL FUNCTION PANEL
ALBUMIN: 4.7 g/dL (ref 3.5–5.5)
BUN / CREAT RATIO: 23 (ref 9–23)
BUN: 17 mg/dL (ref 6–24)
CO2: 22 mmol/L (ref 18–29)
Calcium: 9.9 mg/dL (ref 8.7–10.2)
Chloride: 101 mmol/L (ref 97–108)
Creatinine, Ser: 0.74 mg/dL (ref 0.57–1.00)
GFR calc non Af Amer: 89 mL/min/{1.73_m2} (ref >59)
GFR, EST AFRICAN AMERICAN: 103 mL/min/{1.73_m2} (ref >59)
Glucose: 91 mg/dL (ref 65–99)
Phosphorus: 4.1 mg/dL (ref 2.5–4.5)
Potassium: 4.4 mmol/L (ref 3.5–5.2)
SODIUM: 142 mmol/L (ref 134–144)

## 2015-05-21 ENCOUNTER — Encounter: Payer: Self-pay | Admitting: Family Medicine

## 2015-05-21 ENCOUNTER — Ambulatory Visit (INDEPENDENT_AMBULATORY_CARE_PROVIDER_SITE_OTHER): Payer: BLUE CROSS/BLUE SHIELD | Admitting: Family Medicine

## 2015-05-21 VITALS — BP 120/78 | HR 64 | Temp 98.0°F | Ht 62.0 in | Wt 230.0 lb

## 2015-05-21 DIAGNOSIS — J40 Bronchitis, not specified as acute or chronic: Secondary | ICD-10-CM | POA: Diagnosis not present

## 2015-05-21 MED ORDER — GUAIFENESIN-CODEINE 100-10 MG/5ML PO SOLN: 5.0000 mL | Freq: Three times a day (TID) | ORAL | Status: DC | PRN

## 2015-05-21 MED ORDER — AMOXICILLIN 500 MG PO CAPS: 500.0000 mg | ORAL_CAPSULE | Freq: Three times a day (TID) | ORAL | Status: DC

## 2015-05-21 NOTE — Progress Notes (Signed)
Name: Shannon Blake   MRN: [id number]    DOB: [birth year]   Date:05/21/2015       Progress Note  Subjective  Chief Complaint  Chief Complaint  Patient presents with  . Bronchitis    cough and cong- yellow production/ ears hurting x 4 days    Cough This is a recurrent problem. The current episode started in the past 7 days. The problem has been gradually worsening. The problem occurs every few minutes. The cough is non-productive. Associated symptoms include nasal congestion, postnasal drip, rhinorrhea and shortness of breath. Pertinent negatives include no chest pain, chills, ear congestion, ear pain, fever, headaches, heartburn, hemoptysis, myalgias, rash, sore throat, sweats, weight loss or wheezing. The symptoms are aggravated by cold air. She has tried nothing for the symptoms. Her past medical history is significant for bronchitis. There is no history of asthma, bronchiectasis, COPD, environmental allergies or pneumonia.    No problem-specific assessment & plan notes found for this encounter.   No past medical history on file.  No past surgical history on file.  No family history on file.  Social History   Social History  . Marital Status: Single    Spouse Name: N/A  . Number of Children: N/A  . Years of Education: N/A   Occupational History  . Not on file.   Social History Main Topics  . Smoking status: Never Smoker   . Smokeless tobacco: Not on file  . Alcohol Use: No  . Drug Use: Not on file  . Sexual Activity: Not on file   Other Topics Concern  . Not on file   Social History Narrative    No Known Allergies   Review of Systems  Constitutional: Negative for fever, chills, weight loss and malaise/fatigue.  HENT: Positive for postnasal drip and rhinorrhea. Negative for ear discharge, ear pain and sore throat.   Eyes: Negative for blurred vision.  Respiratory: Positive for cough and shortness of breath. Negative for hemoptysis, sputum production and  wheezing.   Cardiovascular: Negative for chest pain, palpitations and leg swelling.  Gastrointestinal: Negative for heartburn, nausea, abdominal pain, diarrhea, constipation, blood in stool and melena.  Genitourinary: Negative for dysuria, urgency, frequency and hematuria.  Musculoskeletal: Negative for myalgias, back pain, joint pain and neck pain.  Skin: Negative for rash.  Neurological: Negative for dizziness, tingling, sensory change, focal weakness and headaches.  Endo/Heme/Allergies: Negative for environmental allergies and polydipsia. Does not bruise/bleed easily.  Psychiatric/Behavioral: Negative for depression and suicidal ideas. The patient is not nervous/anxious and does not have insomnia.      Objective  Filed Vitals:   05/21/15 1439  BP: 120/78  Pulse: 64  Temp: 98 F (36.7 C)  TempSrc: Oral  Height: 5\' 2"  (1.575 m)  Weight: 230 lb (104.327 kg)  SpO2: 99%    Physical Exam  Constitutional: She is well-developed, well-nourished, and in no distress. No distress.  HENT:  Head: Normocephalic and atraumatic.  Right Ear: External ear normal.  Left Ear: External ear normal.  Nose: Nose normal.  Mouth/Throat: Oropharynx is clear and moist.  Eyes: Conjunctivae and EOM are normal. Pupils are equal, round, and reactive to light. Right eye exhibits no discharge. Left eye exhibits no discharge.  Neck: Normal range of motion. Neck supple. No JVD present. No thyromegaly present.  Cardiovascular: Normal rate, regular rhythm, normal heart sounds and intact distal pulses.  Exam reveals no gallop and no friction rub.   No murmur heard. Pulmonary/Chest: Effort normal and breath  sounds normal.  Abdominal: Soft. Bowel sounds are normal. She exhibits no mass. There is no tenderness. There is no guarding.  Musculoskeletal: Normal range of motion. She exhibits no edema.  Lymphadenopathy:    She has no cervical adenopathy.  Neurological: She is alert. She has normal reflexes.  Skin:  Skin is warm and dry. She is not diaphoretic.  Psychiatric: Mood and affect normal.  Nursing note and vitals reviewed.     Assessment & Plan  Problem List Items Addressed This Visit    None    Visit Diagnoses    Bronchitis    -  Primary    Relevant Medications    amoxicillin (AMOXIL) 500 MG capsule    guaiFENesin-codeine 100-10 MG/5ML syrup         Dr. Macon [facility]  05/21/2015

## 2015-07-02 ENCOUNTER — Encounter: Payer: Self-pay | Admitting: Family Medicine

## 2015-07-02 ENCOUNTER — Ambulatory Visit (INDEPENDENT_AMBULATORY_CARE_PROVIDER_SITE_OTHER): Payer: BLUE CROSS/BLUE SHIELD | Admitting: Family Medicine

## 2015-07-02 VITALS — BP 130/70 | HR 72 | Ht 62.0 in | Wt 229.0 lb

## 2015-07-02 DIAGNOSIS — E782 Mixed hyperlipidemia: Secondary | ICD-10-CM | POA: Diagnosis not present

## 2015-07-02 DIAGNOSIS — I10 Essential (primary) hypertension: Secondary | ICD-10-CM | POA: Diagnosis not present

## 2015-07-02 DIAGNOSIS — E669 Obesity, unspecified: Secondary | ICD-10-CM

## 2015-07-02 MED ORDER — AMLODIPINE BESY-BENAZEPRIL HCL 5-10 MG PO CAPS: 1.0000 | ORAL_CAPSULE | Freq: Every day | ORAL | Status: DC

## 2015-07-02 NOTE — Progress Notes (Signed)
Name: Shannon Blake   MRN: [id number]    DOB: [birth year]   Date:07/02/2015       Progress Note  Subjective  Chief Complaint  Chief Complaint  Patient presents with  . Hypertension    Hypertension This is a chronic problem. The current episode started more than 1 year ago. The problem has been gradually improving since onset. The problem is controlled. Associated symptoms include anxiety. Pertinent negatives include no blurred vision, chest pain, headaches, malaise/fatigue, neck pain, orthopnea, palpitations, peripheral edema, PND, shortness of breath or sweats. There are no associated agents to hypertension. Risk factors for coronary artery disease include obesity. Past treatments include ACE inhibitors and calcium channel blockers. The current treatment provides moderate improvement. There are no compliance problems.  There is no history of angina, kidney disease, CAD/MI, CVA, heart failure, left ventricular hypertrophy, PVD, renovascular disease or retinopathy. There is no history of chronic renal disease or a hypertension causing med.    No problem-specific assessment & plan notes found for this encounter.   Past Medical History  Diagnosis Date  . Hypertension     Past Surgical History  Procedure Laterality Date  . Gallbladder surgery    . Meniscal surgery Left   . Colonoscopy  2007    Dr Candace Cruise    Family History  Problem Relation Age of Onset  . Hypertension Mother   . Hypertension Father   . Cancer Sister     Social History   Social History  . Marital Status: Single    Spouse Name: N/A  . Number of Children: N/A  . Years of Education: N/A   Occupational History  . Not on file.   Social History Main Topics  . Smoking status: Never Smoker   . Smokeless tobacco: Not on file  . Alcohol Use: No  . Drug Use: Not on file  . Sexual Activity: Yes   Other Topics Concern  . Not on file   Social History Narrative    No Known Allergies   Review of Systems   Constitutional: Negative for fever, chills, weight loss and malaise/fatigue.  HENT: Negative for ear discharge, ear pain and sore throat.   Eyes: Negative for blurred vision.  Respiratory: Negative for cough, sputum production, shortness of breath and wheezing.   Cardiovascular: Negative for chest pain, palpitations, orthopnea, leg swelling and PND.  Gastrointestinal: Negative for heartburn, nausea, abdominal pain, diarrhea, constipation, blood in stool and melena.  Genitourinary: Negative for dysuria, urgency, frequency and hematuria.  Musculoskeletal: Positive for joint pain. Negative for myalgias, back pain and neck pain.  Skin: Negative for rash.  Neurological: Negative for dizziness, tingling, sensory change, focal weakness and headaches.  Endo/Heme/Allergies: Negative for environmental allergies and polydipsia. Does not bruise/bleed easily.  Psychiatric/Behavioral: Negative for depression and suicidal ideas. The patient is not nervous/anxious and does not have insomnia.      Objective  Filed Vitals:   07/02/15 0911  BP: 130/70  Pulse: 72  Height: 5\' 2"  (1.575 m)  Weight: 229 lb (103.874 kg)    Physical Exam  Constitutional: She is well-developed, well-nourished, and in no distress. No distress.  HENT:  Head: Normocephalic and atraumatic.  Right Ear: External ear normal.  Left Ear: External ear normal.  Nose: Nose normal.  Mouth/Throat: Oropharynx is clear and moist.  Eyes: Conjunctivae and EOM are normal. Pupils are equal, round, and reactive to light. Right eye exhibits no discharge. Left eye exhibits no discharge.  Neck: Normal range of motion. Neck  supple. No JVD present. No thyromegaly present.  Cardiovascular: Normal rate, regular rhythm, normal heart sounds and intact distal pulses.  Exam reveals no gallop and no friction rub.   No murmur heard. Pulmonary/Chest: Effort normal and breath sounds normal.  Abdominal: Soft. Bowel sounds are normal. She exhibits no mass.  There is no tenderness. There is no guarding.  Musculoskeletal: Normal range of motion. She exhibits no edema.  Lymphadenopathy:    She has no cervical adenopathy.  Neurological: She is alert. She has normal reflexes.  Skin: Skin is warm and dry. She is not diaphoretic.  Psychiatric: Mood and affect normal.  Nursing note and vitals reviewed.     Assessment & Plan  Problem List Items Addressed This Visit      Cardiovascular and Mediastinum   Essential (primary) hypertension - Primary   Relevant Medications   amLODipine-benazepril (LOTREL) 5-10 MG capsule   Other Relevant Orders   Renal Function Panel     Other   Alimentary obesity   Combined fat and carbohydrate induced hyperlipemia   Relevant Medications   amLODipine-benazepril (LOTREL) 5-10 MG capsule   Other Relevant Orders   Lipid Profile        Dr. Otilio Miu [facility]  07/02/2015

## 2015-07-03 LAB — RENAL FUNCTION PANEL
ALBUMIN: 4.4 g/dL (ref 3.6–4.8)
BUN/Creatinine Ratio: 28 — ABNORMAL HIGH (ref 11–26)
BUN: 21 mg/dL (ref 8–27)
CO2: 23 mmol/L (ref 18–29)
CREATININE: 0.76 mg/dL (ref 0.57–1.00)
Calcium: 9.6 mg/dL (ref 8.7–10.3)
Chloride: 104 mmol/L (ref 96–106)
GFR, EST AFRICAN AMERICAN: 99 mL/min/{1.73_m2} (ref >59)
GFR, EST NON AFRICAN AMERICAN: 86 mL/min/{1.73_m2} (ref >59)
GLUCOSE: 83 mg/dL (ref 65–99)
Phosphorus: 3.2 mg/dL (ref 2.5–4.5)
Potassium: 4.6 mmol/L (ref 3.5–5.2)
Sodium: 143 mmol/L (ref 134–144)

## 2015-07-03 LAB — LIPID PANEL
CHOL/HDL RATIO: 3.6 ratio (ref 0.0–4.4)
Cholesterol, Total: 190 mg/dL (ref 100–199)
HDL: 53 mg/dL (ref >39)
LDL CALC: 110 mg/dL — AB (ref 0–99)
TRIGLYCERIDES: 134 mg/dL (ref 0–149)
VLDL CHOLESTEROL CAL: 27 mg/dL (ref 5–40)

## 2015-07-31 ENCOUNTER — Other Ambulatory Visit: Payer: Self-pay

## 2015-08-01 ENCOUNTER — Encounter: Payer: Self-pay | Admitting: Family Medicine

## 2015-08-01 ENCOUNTER — Ambulatory Visit (INDEPENDENT_AMBULATORY_CARE_PROVIDER_SITE_OTHER): Payer: BLUE CROSS/BLUE SHIELD | Admitting: Family Medicine

## 2015-08-01 VITALS — BP 130/80 | HR 88 | Temp 98.8°F | Ht 62.0 in | Wt 223.0 lb

## 2015-08-01 DIAGNOSIS — J0110 Acute frontal sinusitis, unspecified: Secondary | ICD-10-CM

## 2015-08-01 MED ORDER — GUAIFENESIN-CODEINE 100-10 MG/5ML PO SYRP: 5.0000 mL | ORAL_SOLUTION | Freq: Three times a day (TID) | ORAL | Status: DC | PRN

## 2015-08-01 MED ORDER — FLUTICASONE PROPIONATE 50 MCG/ACT NA SUSP: 2.0000 | Freq: Every day | NASAL | Status: DC

## 2015-08-01 MED ORDER — AMOXICILLIN 500 MG PO CAPS: 500.0000 mg | ORAL_CAPSULE | Freq: Three times a day (TID) | ORAL | Status: DC

## 2015-08-01 NOTE — Progress Notes (Signed)
Name: Shannon Blake   MRN: [id number]    DOB: [birth year]   Date:08/01/2015       Progress Note  Subjective  Chief Complaint  Chief Complaint  Patient presents with  . Sinusitis    cough and cong- dry cough, no production- taking otc Mucinex- not helping    Sinusitis This is a new problem. The current episode started in the past 7 days. The problem has been gradually worsening since onset. There has been no fever. Her pain is at a severity of 2/10. The pain is mild. Associated symptoms include chills, congestion, coughing, ear pain, headaches, a hoarse voice, sinus pressure, sneezing and a sore throat. Pertinent negatives include no diaphoresis, neck pain, shortness of breath or swollen glands. Past treatments include acetaminophen and oral decongestants. The treatment provided mild relief.    No problem-specific assessment & plan notes found for this encounter.   Past Medical History  Diagnosis Date  . Hypertension     Past Surgical History  Procedure Laterality Date  . Gallbladder surgery    . Meniscal surgery Left   . Colonoscopy  2007    Dr Candace Cruise    Family History  Problem Relation Age of Onset  . Hypertension Mother   . Hypertension Father   . Cancer Sister     Social History   Social History  . Marital Status: Single    Spouse Name: N/A  . Number of Children: N/A  . Years of Education: N/A   Occupational History  . Not on file.   Social History Main Topics  . Smoking status: Never Smoker   . Smokeless tobacco: Not on file  . Alcohol Use: No  . Drug Use: Not on file  . Sexual Activity: Yes   Other Topics Concern  . Not on file   Social History Narrative    No Known Allergies   Review of Systems  Constitutional: Positive for chills. Negative for fever, weight loss, malaise/fatigue and diaphoresis.  HENT: Positive for congestion, ear pain, hoarse voice, sinus pressure, sneezing and sore throat. Negative for ear discharge.   Eyes: Negative for  blurred vision.  Respiratory: Positive for cough. Negative for sputum production, shortness of breath and wheezing.   Cardiovascular: Negative for chest pain, palpitations and leg swelling.  Gastrointestinal: Negative for heartburn, nausea, abdominal pain, diarrhea, constipation, blood in stool and melena.  Genitourinary: Negative for dysuria, urgency, frequency and hematuria.  Musculoskeletal: Negative for myalgias, back pain, joint pain and neck pain.  Skin: Negative for rash.  Neurological: Positive for headaches. Negative for dizziness, tingling, sensory change and focal weakness.  Endo/Heme/Allergies: Negative for environmental allergies and polydipsia. Does not bruise/bleed easily.  Psychiatric/Behavioral: Negative for depression and suicidal ideas. The patient is not nervous/anxious and does not have insomnia.      Objective  Filed Vitals:   08/01/15 0805 08/01/15 0821  BP: 140/90 130/80  Pulse: 72 88  Temp: 98.8 F (37.1 C)   TempSrc: Oral   Height: 5\' 2"  (1.575 m)   Weight: 223 lb (101.152 kg)   SpO2: 99%     Physical Exam  Constitutional: She is well-developed, well-nourished, and in no distress. No distress.  HENT:  Head: Normocephalic and atraumatic.  Right Ear: External ear normal.  Left Ear: External ear normal.  Nose: Mucosal edema present.  Mouth/Throat: Oropharynx is clear and moist.  Eyes: Conjunctivae and EOM are normal. Pupils are equal, round, and reactive to light. Right eye exhibits no discharge. Left eye  exhibits no discharge.  Neck: Normal range of motion. Neck supple. No JVD present. No thyromegaly present.  Cardiovascular: Normal rate, regular rhythm, normal heart sounds and intact distal pulses.  Exam reveals no gallop and no friction rub.   No murmur heard. Pulmonary/Chest: Effort normal and breath sounds normal.  Abdominal: Soft. Bowel sounds are normal. She exhibits no mass. There is no tenderness. There is no guarding.  Musculoskeletal: Normal  range of motion. She exhibits no edema.  Lymphadenopathy:    She has no cervical adenopathy.  Neurological: She is alert.  Skin: Skin is warm and dry. She is not diaphoretic.  Psychiatric: Mood and affect normal.  Nursing note and vitals reviewed.     Assessment & Plan  Problem List Items Addressed This Visit    None    Visit Diagnoses    Acute frontal sinusitis, recurrence not specified    -  Primary    Relevant Medications    amoxicillin (AMOXIL) 500 MG capsule    fluticasone (FLONASE) 50 MCG/ACT nasal spray    guaiFENesin-codeine (ROBITUSSIN AC) 100-10 MG/5ML syrup         Dr. [facility]  08/01/2015

## 2015-08-15 ENCOUNTER — Encounter
Admission: RE | Admit: 2015-08-15 | Discharge: 2015-08-15 | Disposition: A | Discharge status: Home / Self Care | Payer: BLUE CROSS/BLUE SHIELD | Source: Ambulatory Visit | Attending: Orthopedic Surgery | Admitting: Orthopedic Surgery

## 2015-08-15 ENCOUNTER — Other Ambulatory Visit: Payer: Self-pay

## 2015-08-15 DIAGNOSIS — Z01812 Encounter for preprocedural laboratory examination: Secondary | ICD-10-CM | POA: Insufficient documentation

## 2015-08-15 DIAGNOSIS — Z01810 Encounter for preprocedural cardiovascular examination: Secondary | ICD-10-CM | POA: Insufficient documentation

## 2015-08-15 HISTORY — DX: Unspecified osteoarthritis, unspecified site: M19.90

## 2015-08-15 LAB — PROTIME-INR
INR: 1.06
PROTHROMBIN TIME: 14 s (ref 11.4–15.0)

## 2015-08-15 LAB — BASIC METABOLIC PANEL
Anion gap: 8 (ref 5–15)
BUN: 16 mg/dL (ref 6–20)
CO2: 25 mmol/L (ref 22–32)
Calcium: 9.4 mg/dL (ref 8.9–10.3)
Chloride: 108 mmol/L (ref 101–111)
Creatinine, Ser: 0.66 mg/dL (ref 0.44–1.00)
GFR calc Af Amer: >60 mL/min (ref >60)
GFR calc non Af Amer: >60 mL/min (ref >60)
Glucose, Bld: 86 mg/dL (ref 65–99)
Potassium: 4.1 mmol/L (ref 3.5–5.1)
Sodium: 141 mmol/L (ref 135–145)

## 2015-08-15 LAB — CBC
HCT: 37.9 % (ref 35.0–47.0)
HEMOGLOBIN: 12.8 g/dL (ref 12.0–16.0)
MCH: 27.1 pg (ref 26.0–34.0)
MCHC: 33.7 g/dL (ref 32.0–36.0)
MCV: 80.4 fL (ref 80.0–100.0)
Platelets: 393 10*3/uL (ref 150–440)
RBC: 4.71 MIL/uL (ref 3.80–5.20)
RDW: 13.8 % (ref 11.5–14.5)
WBC: 7.9 10*3/uL (ref 3.6–11.0)

## 2015-08-15 LAB — SURGICAL PCR SCREEN
MRSA, PCR: NEGATIVE
Staphylococcus aureus: POSITIVE — AB

## 2015-08-15 LAB — URINALYSIS COMPLETE WITH MICROSCOPIC (ARMC ONLY)
Bilirubin Urine: NEGATIVE
Glucose, UA: NEGATIVE mg/dL
Hgb urine dipstick: NEGATIVE
KETONES UR: NEGATIVE mg/dL
Nitrite: NEGATIVE
PH: 5 (ref 5.0–8.0)
Protein, ur: NEGATIVE mg/dL
Specific Gravity, Urine: 1.024 (ref 1.005–1.030)

## 2015-08-15 LAB — ABO/RH: ABO/RH(D): A POS

## 2015-08-15 LAB — TYPE AND SCREEN
ABO/RH(D): A POS
ANTIBODY SCREEN: NEGATIVE

## 2015-08-15 LAB — APTT: APTT: 30 s (ref 24–36)

## 2015-08-15 LAB — SEDIMENTATION RATE: Sed Rate: 20 mm/hr (ref 0–30)

## 2015-08-15 NOTE — Patient Instructions (Signed)
  Your procedure is scheduled on: August 27, 2015 (Monday) Report to Day Surgery.[facility]) Second Floor To find out your arrival time please call [phone] between 1PM - 3PM on August 24, 2015 (Friday).  Remember: Instructions that are not followed completely may result in serious medical risk, up to and including death, or upon the discretion of your surgeon and anesthesiologist your surgery may need to be rescheduled.    __x__ 1. Do not eat food or drink liquids after midnight. No gum chewing or hard candies.     ____ 2. No Alcohol for 24 hours before or after surgery.   ____ 3. Bring all medications with you on the day of surgery if instructed.    __x__ 4. Notify your doctor if there is any change in your medical condition     (cold, fever, infections).     Do not wear jewelry, make-up, hairpins, clips or nail polish.  Do not wear lotions, powders, or perfumes. You may wear deodorant.  Do not shave 48 hours prior to surgery. Men may shave face and neck.  Do not bring valuables to the hospital.    [facility] is not responsible for any belongings or valuables.               Contacts, dentures or bridgework may not be worn into surgery.  Leave your suitcase in the car. After surgery it may be brought to your room.  For patients admitted to the hospital, discharge time is determined by your                treatment team.   Patients discharged the day of surgery will not be allowed to drive home.   Please read over the following fact sheets that you were given:   MRSA Information and Surgical Site Infection Prevention   _x___ Take these medicines the morning of surgery with A SIP OF WATER:    1. AMLODIPINE-BENAZEPRIL  2.   3.   4.  5.  6.  ____ Fleet Enema (as directed)   _x__ Use CHG Soap as directed  ____ Use inhalers on the day of surgery  ____ Stop metformin 2 days prior to surgery    ____ Take 1/2 of usual insulin dose the night before surgery and none on  the morning of surgery.   _x___ Stop Coumadin/Plavix/aspirin on (STOP ASPIRIN ONE WEEK BEFORE SURGERY)  _x___ Stop Anti-inflammatories on (STOP MELOXICAM ONE WEEK BEFORE SURGERY)   _x___ Stop supplements until after surgery.  (STOP MULTI-VITAMIN, AND GLUCOSAMINE NOW)  ____ Bring C-Pap to the hospital.

## 2015-08-15 NOTE — Pre-Procedure Instructions (Signed)
Positive staph and UA results faxed to Dr. Marry Guan office.

## 2015-08-17 LAB — URINE CULTURE: Culture: >=100000

## 2015-08-20 NOTE — Pre-Procedure Instructions (Signed)
EKG NOTED. 

## 2015-08-24 NOTE — OR Nursing (Signed)
Spoke to [place] at Dr [employer] office urine culture was treated.

## 2015-08-27 ENCOUNTER — Inpatient Hospital Stay: Payer: BLUE CROSS/BLUE SHIELD

## 2015-08-27 ENCOUNTER — Inpatient Hospital Stay
Admission: AD | Admit: 2015-08-27 | Discharge: 2015-08-29 | DRG: 470 | Disposition: A | Discharge status: Home Health | Payer: BLUE CROSS/BLUE SHIELD | Source: Ambulatory Visit | Attending: Orthopedic Surgery | Admitting: Orthopedic Surgery

## 2015-08-27 ENCOUNTER — Inpatient Hospital Stay: Payer: BLUE CROSS/BLUE SHIELD | Admitting: Anesthesiology

## 2015-08-27 ENCOUNTER — Encounter
Admission: AD | Disposition: A | Discharge status: Home Health | Payer: Self-pay | Source: Ambulatory Visit | Attending: Orthopedic Surgery

## 2015-08-27 DIAGNOSIS — Z6841 Body Mass Index (BMI) 40.0 and over, adult: Secondary | ICD-10-CM | POA: Diagnosis not present

## 2015-08-27 DIAGNOSIS — Z791 Long term (current) use of non-steroidal anti-inflammatories (NSAID): Secondary | ICD-10-CM | POA: Diagnosis not present

## 2015-08-27 DIAGNOSIS — Z96659 Presence of unspecified artificial knee joint: Secondary | ICD-10-CM

## 2015-08-27 DIAGNOSIS — Z96652 Presence of left artificial knee joint: Secondary | ICD-10-CM

## 2015-08-27 DIAGNOSIS — M1712 Unilateral primary osteoarthritis, left knee: Principal | ICD-10-CM | POA: Diagnosis present

## 2015-08-27 DIAGNOSIS — I10 Essential (primary) hypertension: Secondary | ICD-10-CM | POA: Diagnosis present

## 2015-08-27 DIAGNOSIS — E669 Obesity, unspecified: Secondary | ICD-10-CM | POA: Diagnosis present

## 2015-08-27 DIAGNOSIS — Z79899 Other long term (current) drug therapy: Secondary | ICD-10-CM | POA: Diagnosis not present

## 2015-08-27 HISTORY — PX: KNEE ARTHROPLASTY: SHX992

## 2015-08-27 SURGERY — ARTHROPLASTY, KNEE, TOTAL, USING IMAGELESS COMPUTER-ASSISTED NAVIGATION
Anesthesia: General | Site: Knee | Laterality: Left | Wound class: Clean

## 2015-08-27 MED ORDER — ONDANSETRON HCL 4 MG/2ML IJ SOLN: 4.0000 mg | Freq: Four times a day (QID) | INTRAMUSCULAR | Status: DC | PRN

## 2015-08-27 MED ORDER — ACETAMINOPHEN 325 MG PO TABS: 650.0000 mg | ORAL_TABLET | Freq: Four times a day (QID) | ORAL | Status: DC | PRN

## 2015-08-27 MED ORDER — TRANEXAMIC ACID 1000 MG/10ML IV SOLN
1000.0000 mg | INTRAVENOUS | Status: DC | PRN
  Administered 2015-08-27: 1000 mg via INTRAVENOUS

## 2015-08-27 MED ORDER — PHENYLEPHRINE HCL 10 MG/ML IJ SOLN
INTRAMUSCULAR | Status: DC | PRN
  Administered 2015-08-27: 100 ug via INTRAVENOUS
  Administered 2015-08-27: 200 ug via INTRAVENOUS

## 2015-08-27 MED ORDER — NEOMYCIN-POLYMYXIN B GU 40-200000 IR SOLN
Status: AC
  Filled 2015-08-27: qty 20

## 2015-08-27 MED ORDER — MIDAZOLAM HCL 5 MG/5ML IJ SOLN
INTRAMUSCULAR | Status: DC | PRN
  Administered 2015-08-27: 2 mg via INTRAVENOUS

## 2015-08-27 MED ORDER — ACETAMINOPHEN 650 MG RE SUPP: 650.0000 mg | Freq: Four times a day (QID) | RECTAL | Status: DC | PRN

## 2015-08-27 MED ORDER — CALCIUM CARBONATE-VITAMIN D 500-200 MG-UNIT PO TABS
1.0000 | ORAL_TABLET | Freq: Every day | ORAL | Status: DC
  Administered 2015-08-28 – 2015-08-29 (×2): 1 via ORAL
  Filled 2015-08-27 (×3): qty 1

## 2015-08-27 MED ORDER — FENTANYL CITRATE (PF) 100 MCG/2ML IJ SOLN: 25.0000 ug | INTRAMUSCULAR | Status: DC | PRN

## 2015-08-27 MED ORDER — KETAMINE HCL 50 MG/ML IJ SOLN
INTRAMUSCULAR | Status: DC | PRN
  Administered 2015-08-27: 5 mg via INTRAMUSCULAR

## 2015-08-27 MED ORDER — ACETAMINOPHEN 10 MG/ML IV SOLN
INTRAVENOUS | Status: DC | PRN
  Administered 2015-08-27: 1000 mg via INTRAVENOUS

## 2015-08-27 MED ORDER — METOCLOPRAMIDE HCL 10 MG PO TABS
10.0000 mg | ORAL_TABLET | Freq: Three times a day (TID) | ORAL | Status: AC
  Administered 2015-08-27 – 2015-08-29 (×8): 10 mg via ORAL
  Filled 2015-08-27 (×8): qty 1

## 2015-08-27 MED ORDER — ADULT MULTIVITAMIN W/MINERALS CH
1.0000 | ORAL_TABLET | Freq: Every day | ORAL | Status: DC
  Administered 2015-08-28 – 2015-08-29 (×2): 1 via ORAL
  Filled 2015-08-27 (×2): qty 1

## 2015-08-27 MED ORDER — FAMOTIDINE 20 MG PO TABS
ORAL_TABLET | ORAL | Status: AC
  Administered 2015-08-27: 20 mg via ORAL
  Filled 2015-08-27: qty 1

## 2015-08-27 MED ORDER — FAMOTIDINE 20 MG PO TABS
20.0000 mg | ORAL_TABLET | Freq: Once | ORAL | Status: AC
  Administered 2015-08-27: 20 mg via ORAL

## 2015-08-27 MED ORDER — PROPOFOL 10 MG/ML IV BOLUS
INTRAVENOUS | Status: DC | PRN
  Administered 2015-08-27: 50 mg via INTRAVENOUS

## 2015-08-27 MED ORDER — SODIUM CHLORIDE FLUSH 0.9 % IV SOLN
INTRAVENOUS | Status: AC
  Administered 2015-08-27: 07:00:00
  Filled 2015-08-27: qty 10

## 2015-08-27 MED ORDER — FENTANYL CITRATE (PF) 100 MCG/2ML IJ SOLN
INTRAMUSCULAR | Status: DC | PRN
  Administered 2015-08-27 (×2): 50 ug via INTRAVENOUS

## 2015-08-27 MED ORDER — BENAZEPRIL HCL 20 MG PO TABS
10.0000 mg | ORAL_TABLET | Freq: Every day | ORAL | Status: DC
Start: 2015-08-27 — End: 2015-08-29
  Administered 2015-08-28 – 2015-08-29 (×2): 10 mg via ORAL
  Filled 2015-08-27 (×2): qty 1

## 2015-08-27 MED ORDER — TRANEXAMIC ACID 1000 MG/10ML IV SOLN
1000.0000 mg | Freq: Once | INTRAVENOUS | Status: AC
  Administered 2015-08-27: 1000 mg via INTRAVENOUS
  Filled 2015-08-27: qty 10

## 2015-08-27 MED ORDER — ALUM & MAG HYDROXIDE-SIMETH 200-200-20 MG/5ML PO SUSP: 30.0000 mL | ORAL | Status: DC | PRN

## 2015-08-27 MED ORDER — FERROUS SULFATE 325 (65 FE) MG PO TABS
325.0000 mg | ORAL_TABLET | Freq: Two times a day (BID) | ORAL | Status: DC
  Administered 2015-08-28 – 2015-08-29 (×3): 325 mg via ORAL
  Filled 2015-08-27 (×3): qty 1

## 2015-08-27 MED ORDER — SODIUM CHLORIDE 0.9 % IV SOLN
10000.0000 ug | INTRAVENOUS | Status: DC | PRN
  Administered 2015-08-27: 25 ug/min via INTRAVENOUS

## 2015-08-27 MED ORDER — SENNOSIDES-DOCUSATE SODIUM 8.6-50 MG PO TABS
1.0000 | ORAL_TABLET | Freq: Two times a day (BID) | ORAL | Status: DC
  Administered 2015-08-27 – 2015-08-28 (×2): 1 via ORAL
  Filled 2015-08-27 (×2): qty 1

## 2015-08-27 MED ORDER — CEFAZOLIN SODIUM-DEXTROSE 2-3 GM-% IV SOLR
2.0000 g | Freq: Once | INTRAVENOUS | Status: AC
  Administered 2015-08-27: 2 g via INTRAVENOUS

## 2015-08-27 MED ORDER — PANTOPRAZOLE SODIUM 40 MG PO TBEC
40.0000 mg | DELAYED_RELEASE_TABLET | Freq: Two times a day (BID) | ORAL | Status: DC
  Administered 2015-08-27 – 2015-08-29 (×5): 40 mg via ORAL
  Filled 2015-08-27 (×5): qty 1

## 2015-08-27 MED ORDER — ENOXAPARIN SODIUM 30 MG/0.3ML ~~LOC~~ SOLN
30.0000 mg | Freq: Two times a day (BID) | SUBCUTANEOUS | Status: DC
  Administered 2015-08-28 – 2015-08-29 (×3): 30 mg via SUBCUTANEOUS
  Filled 2015-08-27 (×3): qty 0.3

## 2015-08-27 MED ORDER — BUPIVACAINE-EPINEPHRINE (PF) 0.25% -1:200000 IJ SOLN
INTRAMUSCULAR | Status: AC
  Filled 2015-08-27: qty 30

## 2015-08-27 MED ORDER — MAGNESIUM HYDROXIDE 400 MG/5ML PO SUSP
30.0000 mL | Freq: Every day | ORAL | Status: DC | PRN
  Administered 2015-08-28: 30 mL via ORAL
  Filled 2015-08-27: qty 30

## 2015-08-27 MED ORDER — CEFAZOLIN SODIUM-DEXTROSE 2-3 GM-% IV SOLR
2.0000 g | Freq: Four times a day (QID) | INTRAVENOUS | Status: AC
  Administered 2015-08-27 – 2015-08-28 (×4): 2 g via INTRAVENOUS
  Filled 2015-08-27 (×4): qty 50

## 2015-08-27 MED ORDER — PROPOFOL 500 MG/50ML IV EMUL
INTRAVENOUS | Status: DC | PRN
  Administered 2015-08-27: 40 ug/kg/min via INTRAVENOUS

## 2015-08-27 MED ORDER — OXYCODONE HCL 5 MG PO TABS
5.0000 mg | ORAL_TABLET | ORAL | Status: DC | PRN
  Administered 2015-08-27: 5 mg via ORAL
  Administered 2015-08-27: 10 mg via ORAL
  Administered 2015-08-27: 5 mg via ORAL
  Administered 2015-08-28 – 2015-08-29 (×4): 10 mg via ORAL
  Filled 2015-08-27 (×2): qty 2
  Filled 2015-08-27: qty 1
  Filled 2015-08-27 (×3): qty 2
  Filled 2015-08-27: qty 1

## 2015-08-27 MED ORDER — BUPIVACAINE HCL (PF) 0.5 % IJ SOLN
INTRAMUSCULAR | Status: DC | PRN
  Administered 2015-08-27: 3 mL

## 2015-08-27 MED ORDER — CELECOXIB 200 MG PO CAPS
200.0000 mg | ORAL_CAPSULE | Freq: Two times a day (BID) | ORAL | Status: DC
  Administered 2015-08-27 – 2015-08-29 (×5): 200 mg via ORAL
  Filled 2015-08-27 (×5): qty 1

## 2015-08-27 MED ORDER — SODIUM CHLORIDE 0.9 % IJ SOLN
INTRAMUSCULAR | Status: AC
  Filled 2015-08-27: qty 50

## 2015-08-27 MED ORDER — TRAMADOL HCL 50 MG PO TABS
50.0000 mg | ORAL_TABLET | ORAL | Status: DC | PRN
  Administered 2015-08-28 – 2015-08-29 (×5): 50 mg via ORAL
  Filled 2015-08-27 (×5): qty 1

## 2015-08-27 MED ORDER — BUPIVACAINE-EPINEPHRINE 0.25% -1:200000 IJ SOLN
INTRAMUSCULAR | Status: DC | PRN
  Administered 2015-08-27: 30 mL

## 2015-08-27 MED ORDER — ACETAMINOPHEN 10 MG/ML IV SOLN
INTRAVENOUS | Status: AC
  Filled 2015-08-27: qty 100

## 2015-08-27 MED ORDER — LACTATED RINGERS IV SOLN
INTRAVENOUS | Status: DC
  Administered 2015-08-27 (×2): via INTRAVENOUS

## 2015-08-27 MED ORDER — KETAMINE HCL 100 MG/ML IJ SOLN
250.0000 mg | INTRAMUSCULAR | Status: DC | PRN
  Administered 2015-08-27: 4 ug/kg/min via INTRAVENOUS

## 2015-08-27 MED ORDER — ACETAMINOPHEN 10 MG/ML IV SOLN
1000.0000 mg | Freq: Four times a day (QID) | INTRAVENOUS | Status: AC
  Administered 2015-08-27 – 2015-08-28 (×4): 1000 mg via INTRAVENOUS
  Filled 2015-08-27 (×5): qty 100

## 2015-08-27 MED ORDER — ONDANSETRON HCL 4 MG/2ML IJ SOLN: 4.0000 mg | Freq: Once | INTRAMUSCULAR | Status: DC | PRN

## 2015-08-27 MED ORDER — CEFAZOLIN SODIUM-DEXTROSE 2-3 GM-% IV SOLR
INTRAVENOUS | Status: AC
  Filled 2015-08-27: qty 50

## 2015-08-27 MED ORDER — BISACODYL 10 MG RE SUPP: 10.0000 mg | Freq: Every day | RECTAL | Status: DC | PRN

## 2015-08-27 MED ORDER — BUPIVACAINE LIPOSOME 1.3 % IJ SUSP
INTRAMUSCULAR | Status: AC
  Filled 2015-08-27: qty 20

## 2015-08-27 MED ORDER — NEOMYCIN-POLYMYXIN B GU 40-200000 IR SOLN
Status: DC | PRN
  Administered 2015-08-27: 14 mL

## 2015-08-27 MED ORDER — CENTRUM SILVER ULTRA WOMENS PO TABS: 1.0000 | ORAL_TABLET | Freq: Every day | ORAL | Status: DC

## 2015-08-27 MED ORDER — TRANEXAMIC ACID 1000 MG/10ML IV SOLN
1500.0000 mg | INTRAVENOUS | Status: DC
  Filled 2015-08-27: qty 15

## 2015-08-27 MED ORDER — FLEET ENEMA 7-19 GM/118ML RE ENEM: 1.0000 | ENEMA | Freq: Once | RECTAL | Status: DC | PRN

## 2015-08-27 MED ORDER — PHENOL 1.4 % MT LIQD
1.0000 | OROMUCOSAL | Status: DC | PRN
  Filled 2015-08-27: qty 177

## 2015-08-27 MED ORDER — ONDANSETRON HCL 4 MG PO TABS: 4.0000 mg | ORAL_TABLET | Freq: Four times a day (QID) | ORAL | Status: DC | PRN

## 2015-08-27 MED ORDER — MENTHOL 3 MG MT LOZG
1.0000 | LOZENGE | OROMUCOSAL | Status: DC | PRN
  Filled 2015-08-27: qty 9

## 2015-08-27 MED ORDER — SODIUM CHLORIDE 0.9 % IV SOLN
INTRAVENOUS | Status: DC
  Administered 2015-08-27 (×2): via INTRAVENOUS

## 2015-08-27 MED ORDER — AMLODIPINE BESYLATE 5 MG PO TABS
5.0000 mg | ORAL_TABLET | Freq: Every day | ORAL | Status: DC
  Administered 2015-08-28 – 2015-08-29 (×2): 5 mg via ORAL
  Filled 2015-08-27 (×2): qty 1

## 2015-08-27 MED ORDER — SODIUM CHLORIDE 0.9 % IV SOLN
INTRAVENOUS | Status: DC | PRN
  Administered 2015-08-27: 60 mL

## 2015-08-27 MED ORDER — MORPHINE SULFATE (PF) 2 MG/ML IV SOLN
2.0000 mg | INTRAVENOUS | Status: DC | PRN
  Administered 2015-08-27 (×2): 2 mg via INTRAVENOUS
  Filled 2015-08-27 (×2): qty 1

## 2015-08-27 MED ORDER — EPINEPHRINE HCL 1 MG/ML IJ SOLN
INTRAMUSCULAR | Status: DC | PRN
  Administered 2015-08-27: .2 mg via INTRATHECAL

## 2015-08-27 MED ORDER — DIPHENHYDRAMINE HCL 12.5 MG/5ML PO ELIX: 12.5000 mg | ORAL_SOLUTION | ORAL | Status: DC | PRN

## 2015-08-27 SURGICAL SUPPLY — 57 items
AUTOTRANSFUS HAS 1/8 (MISCELLANEOUS) ×2
BATTERY INSTRU NAVIGATION (MISCELLANEOUS) ×8 IMPLANT
BLADE SAW 1 (BLADE) ×2 IMPLANT
BLADE SAW 1/2 (BLADE) ×2 IMPLANT
BONE CEMENT GENTAMICIN (Cement) ×4 IMPLANT
CANISTER SUCT 1200ML W/VALVE (MISCELLANEOUS) ×2 IMPLANT
CANISTER SUCT 3000ML (MISCELLANEOUS) ×4 IMPLANT
CAP KNEE TOTAL 3 SIGMA ×2 IMPLANT
CATH TRAY METER 16FR LF (MISCELLANEOUS) ×2 IMPLANT
CEMENT BONE GENTAMICIN 40 (Cement) ×2 IMPLANT
COOLER POLAR GLACIER W/PUMP (MISCELLANEOUS) ×2 IMPLANT
DRAPE SHEET LG 3/4 BI-LAMINATE (DRAPES) ×4 IMPLANT
DRSG DERMACEA 8X12 NADH (GAUZE/BANDAGES/DRESSINGS) ×2 IMPLANT
DRSG OPSITE POSTOP 4X14 (GAUZE/BANDAGES/DRESSINGS) ×2 IMPLANT
DRSG TEGADERM 4X4.75 (GAUZE/BANDAGES/DRESSINGS) ×2 IMPLANT
DURAPREP 26ML APPLICATOR (WOUND CARE) ×4 IMPLANT
ELECT CAUTERY BLADE 6.4 (BLADE) ×2 IMPLANT
ELECT REM PT RETURN 9FT ADLT (ELECTROSURGICAL) ×2
ELECTRODE REM PT RTRN 9FT ADLT (ELECTROSURGICAL) ×1 IMPLANT
EX-PIN ORTHOLOCK NAV 4X150 (PIN) ×4 IMPLANT
GLOVE BIOGEL M STRL SZ7.5 (GLOVE) ×4 IMPLANT
GLOVE INDICATOR 8.0 STRL GRN (GLOVE) ×2 IMPLANT
GLOVE SURG 9.0 ORTHO LTXF (GLOVE) ×2 IMPLANT
GLOVE SURG ORTHO 9.0 STRL STRW (GLOVE) ×2 IMPLANT
GOWN STRL REUS W/ TWL LRG LVL3 (GOWN DISPOSABLE) ×2 IMPLANT
GOWN STRL REUS W/TWL 2XL LVL3 (GOWN DISPOSABLE) ×2 IMPLANT
GOWN STRL REUS W/TWL LRG LVL3 (GOWN DISPOSABLE) ×2
HANDPIECE SUCTION TUBG SURGILV (MISCELLANEOUS) ×2 IMPLANT
HOLDER FOLEY CATH W/STRAP (MISCELLANEOUS) ×2 IMPLANT
HOOD PEEL AWAY FLYTE STAYCOOL (MISCELLANEOUS) ×4 IMPLANT
KIT RM TURNOVER STRD PROC AR (KITS) ×2 IMPLANT
KNIFE SCULPS 14X20 (INSTRUMENTS) ×2 IMPLANT
NDL SAFETY 18GX1.5 (NEEDLE) ×2 IMPLANT
NEEDLE SPNL 20GX3.5 QUINCKE YW (NEEDLE) ×2 IMPLANT
NS IRRIG 500ML POUR BTL (IV SOLUTION) ×2 IMPLANT
PACK TOTAL KNEE (MISCELLANEOUS) ×2 IMPLANT
PAD WRAPON POLAR KNEE (MISCELLANEOUS) ×1 IMPLANT
PIN DRILL QUICK PACK ×2 IMPLANT
PIN FIXATION 1/8DIA X 3INL (PIN) ×2 IMPLANT
SOL .9 NS 3000ML IRR  AL (IV SOLUTION) ×1
SOL .9 NS 3000ML IRR UROMATIC (IV SOLUTION) ×1 IMPLANT
SOL PREP PVP 2OZ (MISCELLANEOUS) ×2
SOLUTION PREP PVP 2OZ (MISCELLANEOUS) ×1 IMPLANT
SPONGE DRAIN TRACH 4X4 STRL 2S (GAUZE/BANDAGES/DRESSINGS) ×2 IMPLANT
STAPLER SKIN PROX 35W (STAPLE) ×2 IMPLANT
SUCTION FRAZIER HANDLE 10FR (MISCELLANEOUS) ×1
SUCTION TUBE FRAZIER 10FR DISP (MISCELLANEOUS) ×1 IMPLANT
SUT VIC AB 0 CT1 36 (SUTURE) ×2 IMPLANT
SUT VIC AB 1 CT1 36 (SUTURE) ×4 IMPLANT
SUT VIC AB 2-0 CT2 27 (SUTURE) ×2 IMPLANT
SYR 20CC LL (SYRINGE) ×2 IMPLANT
SYR 30ML LL (SYRINGE) ×2 IMPLANT
SYR 50ML LL SCALE MARK (SYRINGE) ×2 IMPLANT
SYSTEM AUTOTRANSFUS DUAL TROCR (MISCELLANEOUS) ×1 IMPLANT
TOWEL OR 17X26 4PK STRL BLUE (TOWEL DISPOSABLE) ×2 IMPLANT
TOWER CARTRIDGE SMART MIX (DISPOSABLE) ×2 IMPLANT
WRAPON POLAR PAD KNEE (MISCELLANEOUS) ×2

## 2015-08-27 NOTE — Evaluation (Signed)
Physical Therapy Evaluation Patient Details Name: Shannon Blake MRN: [id number] DOB: 08/21/1955 Today's Date: 08/27/2015   History of Present Illness  Pt underwent L TKR without reported post-op complications. POD#0 at time of initial evaluation. Pt reports high levels of pain at time of evaluation. She was premedicated before session and also provided morphine during PT session. Pt denies falls in the last 12 months. 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 She does complain of high levels of pain which are being managed by RN during PT session. Pt able to complete all supine exercises, demonstrating ability to perform LLE SLR and SAQ without assist. She requires minA+1 for bed mobility but CGA only for transfers and limited ambulation to recliner. AAROM is excellent for POD#0 with dressings applied. Pt should be appropriate to discharge home with [facility] PT.  Pt will benefit from skilled PT services to address deficits in strength, balance, and mobility in order to return to full function at home.     Follow Up Recommendations Home health PT    Equipment Recommendations  None recommended by PT (Pt already has rolling walker at home)    Recommendations for Other Services       Precautions / Restrictions Precautions Precautions: Knee;Fall Precaution Booklet Issued: Yes (comment) Required Braces or Orthoses: Knee Immobilizer - Left Knee Immobilizer - Left: Discontinue once straight leg raise with < 10 degree lag Restrictions Weight Bearing Restrictions: Yes LLE Weight Bearing: Weight bearing as tolerated      Mobility  Bed Mobility Overal bed mobility: Needs Assistance Bed Mobility: Supine to Sit     Supine to sit: Min assist     General bed mobility comments: Pt requires minA+1 to assist LLE with adduction to R side of bed as well as for  eccentric quad control to limit pain at EOB. Overall good sequencing and strength noted  Transfers Overall transfer level: Needs assistance Equipment used: Rolling walker (2 wheeled) Transfers: Sit to/from Stand Sit to Stand: Min guard         General transfer comment: Pt provided cues for hand placement during transfer. Decreased weight shifting to LLE but good strength noted in RLE. Pt steady and stable in standing with use of rolling walker.  Ambulation/Gait Ambulation/Gait assistance: Min guard Ambulation Distance (Feet): 3 Feet Assistive device: Rolling walker (2 wheeled) Gait Pattern/deviations: Step-to pattern;Antalgic;Decreased step length - right;Decreased stance time - left;Decreased weight shift to left Gait velocity: Decreased Gait velocity interpretation: <1.8 ft/sec, indicative of risk for recurrent falls General Gait Details: Pt takes a few small steps from bed to recliner. Cues for proper sequencing with walker. Decreased weight shift and stance time on LLE. Pt is steady with UE support but is lethargic likely due to pain medication. Once to chair pt becomes flush while sitting down and provided a wet wash cloth. Symptoms quickly pass.  Stairs            Wheelchair Mobility    Modified Rankin (Stroke Patients Only)       Balance Overall balance assessment: Needs assistance Sitting-balance support: No upper extremity supported Sitting balance-Leahy Scale: Good     Standing balance support: Bilateral upper extremity supported Standing balance-Leahy Scale: Fair Standing balance comment: Pt with decreased weight shift to LLE but on RLE balance is fair. Formal balance assessment deferred  Pertinent Vitals/Pain Pain Assessment: 0-10 Pain Score: 8  Pain Location: L knee Pain Intervention(s): Monitored during session;Premedicated before session;RN gave pain meds during session    [employer] expects to  be discharged to:: Private residence Living Arrangements: Spouse/significant other Available Help at Discharge: Family Type of Home: House Home Access: Stairs to enter Entrance Stairs-Rails: Can reach both Entrance Stairs-Number of Steps: 4 Home Layout: One level Home Equipment: Environmental consultant - 2 wheels;Cane - single point;Bedside commode;Grab bars - tub/shower (no grab bars near commode, no shower cahir)      Prior Function Level of Independence: Independent         Comments: Works as Quarry manager at [employer]. Drives and cares for all ADLs/IADLs     Hand Dominance        Extremity/Trunk Assessment   Upper Extremity Assessment: Overall WFL for tasks assessed           Lower Extremity Assessment: LLE deficits/detail   LLE Deficits / Details: Pt able to perform SLR and SAQ without assist. Full DF/PF. Pt reports full sensation to light touch throughout LLE. Bandaging limits sensation testing     Communication   Communication: No difficulties  Cognition Arousal/Alertness: Lethargic;Suspect due to medications Behavior During Therapy: [facility] for tasks assessed/performed Overall Cognitive Status: Within Functional Limits for tasks assessed                      General Comments      Exercises Total Joint Exercises Ankle Circles/Pumps: Strengthening;Both;10 reps;Supine Quad Sets: Strengthening;Both;10 reps;Supine Gluteal Sets: Strengthening;Both;10 reps;Supine Towel Squeeze: Strengthening;Both;10 reps;Supine Short Arc Quad: Strengthening;Left;10 reps;Supine Heel Slides: Strengthening;Left;10 reps;Supine Hip ABduction/ADduction: Strengthening;Left;10 reps;Supine Straight Leg Raises: Strengthening;Left;10 reps;Supine Goniometric ROM: -5 to 78 degrees AAROM, pain limited      Assessment/Plan    PT Assessment Patient needs continued PT services  PT Diagnosis Difficulty walking;Abnormality of gait;Generalized weakness;Acute pain   PT Problem List Decreased  strength;Decreased range of motion;Decreased activity tolerance;Decreased balance;Decreased mobility;Pain;Obesity  PT Treatment Interventions DME instruction;Gait training;Stair training;Therapeutic activities;Therapeutic exercise;Balance training;Neuromuscular re-education;Patient/family education;Manual techniques   PT Goals (Current goals can be found in the Care Plan section) Acute Rehab PT Goals Patient Stated Goal: Return home with improved function and less pain PT Goal Formulation: With patient Time For Goal Achievement: 09/10/15 Potential to Achieve Goals: Good    Frequency BID   Barriers to discharge        Co-evaluation               End of Session Equipment Utilized During Treatment: Gait belt Activity Tolerance: Patient tolerated treatment well Patient left: in chair;with call bell/phone within reach;with chair alarm set;with family/visitor present;with SCD's reapplied (towel roll under heel, polar care in place) Nurse Communication: Mobility status (RN present to observe mobility and provide pain meds)         Time: [id number] PT Time Calculation (min) (ACUTE ONLY): 26 min   Charges:   PT Evaluation $PT Eval Low Complexity: 1 Procedure PT Treatments $Therapeutic Exercise: 8-22 mins   PT G Codes:       Lyndel Safe Bethania Schlotzhauer PT, DPT   Sedonia Kitner 08/27/2015, 3:59 PM

## 2015-08-27 NOTE — Anesthesia Procedure Notes (Signed)
Spinal Patient location during procedure: OR Staffing Anesthesiologist: Katy Fitch K Performed by: anesthesiologist  Preanesthetic Checklist Completed: patient identified, site marked, surgical consent, pre-op evaluation, timeout performed, IV checked, risks and benefits discussed and monitors and equipment checked Spinal Block Patient position: sitting Prep: Betadine Patient monitoring: heart rate, continuous pulse ox, blood pressure and cardiac monitor Approach: midline Location: L4-5 Injection technique: single-shot Needle Needle type: Whitacre and Introducer  Needle gauge: 24 G Needle length: 9 cm Assessment Sensory level: T6 Additional Notes Negative paresthesia. Negative blood return. Positive free-flowing CSF. Expiration date of kit checked and confirmed. Patient tolerated procedure well, without complications. JA

## 2015-08-27 NOTE — Anesthesia Preprocedure Evaluation (Signed)
Anesthesia Evaluation  Patient identified by MRN, date of birth, ID band Patient awake    Reviewed: Allergy & Precautions, H&P , NPO status , Patient's Chart, lab work & pertinent test results, reviewed documented beta blocker date and time   Airway Mallampati: II   Neck ROM: full    Dental  (+) Teeth Intact   Pulmonary neg pulmonary ROS,    Pulmonary exam normal        Cardiovascular hypertension, negative cardio ROS Normal cardiovascular exam Rate:Normal     Neuro/Psych negative neurological ROS  negative psych ROS   GI/Hepatic negative GI ROS, Neg liver ROS,   Endo/Other  negative endocrine ROS  Renal/GU Renal diseasenegative Renal ROS  negative genitourinary   Musculoskeletal   Abdominal   Peds  Hematology negative hematology ROS (+)   Anesthesia Other Findings Past Medical History:   Hypertension                                                 Arthritis                                                  Past Surgical History:   GALLBLADDER SURGERY                                           meniscal surgery                                Left              COLONOSCOPY                                      2007           Comment:Dr Oh   CHOLECYSTECTOMY                                             BMI    Body Mass Index   41.69 kg/m 2     Reproductive/Obstetrics negative OB ROS                             Anesthesia Physical Anesthesia Plan  ASA: III  Anesthesia Plan: General   Post-op Pain Management:    Induction:   Airway Management Planned:   Additional Equipment:   Intra-op Plan:   Post-operative Plan:   Informed Consent: I have reviewed the patients History and Physical, chart, labs and discussed the procedure including the risks, benefits and alternatives for the proposed anesthesia with the patient or authorized representative who has indicated his/her  understanding and acceptance.   Dental Advisory Given  Plan Discussed with: CRNA  Anesthesia Plan Comments:         Anesthesia Quick Evaluation

## 2015-08-27 NOTE — Care Management Note (Addendum)
Case Management Note  Patient Details  Name: AIVY AKTER MRN: [id number] Date of Birth: Mar 04, 1956  Subjective/Objective:                  Met with patient to discuss discharge planning. She wants to return home with home health services through [facility]. She states she has a rolling walker, cane, and potty chair available for use. She says Jenny Reichmann will provide assistance if needed in the home. She uses [facility] for [facility] [phone].  Action/Plan: List of home health agencies left with patient. Referral called to [facility]. Lovenox 32m #14 called [facility] to [facility]. RNCM will continue to follow.   Expected Discharge Date:                  Expected Discharge Plan:     In-House Referral:     Discharge planning Services  CM Consult  Post Acute Care Choice:  Home Health Choice offered to:  Patient, Adult Children  DME Arranged:    DME Agency:     HH Arranged:    HH Agency:  [place] Status of Service:  In process, will continue to follow  Medicare Important Message Given:    Date Medicare IM Given:    Medicare IM give by:    Date Additional Medicare IM Given:    Additional Medicare Important Message give by:     If discussed at Long Length of Stay Meetings, dates discussed:   Lovenox $10.00.   Additional Comments:  AMarshell Garfinkel RN 08/27/2015, 2:05 PM

## 2015-08-27 NOTE — Transfer of Care (Signed)
Immediate Anesthesia Transfer of Care Note  Patient: Shannon Blake  Procedure(s) Performed: Procedure(s): COMPUTER ASSISTED TOTAL KNEE ARTHROPLASTY (Left)  Patient Location: PACU  Anesthesia Type:Spinal  Level of Consciousness: sedated  Airway & Oxygen Therapy: Patient Spontanous Breathing and Patient connected to nasal cannula oxygen  Post-op Assessment: Report given to RN and Post -op Vital signs reviewed and stable  Post vital signs: Reviewed and stable  Last Vitals:  Filed Vitals:   08/27/15 0604  BP: 145/75  Pulse: 95  Temp: 36.6 C  Resp: 14    Complications: No apparent anesthesia complications

## 2015-08-27 NOTE — Brief Op Note (Signed)
08/27/2015  10:38 AM  PATIENT:  Shannon Blake  60 y.o. female  PRE-OPERATIVE DIAGNOSIS:  ARTHROSIS OF LEFT KNEE  POST-OPERATIVE DIAGNOSIS:  ARTHROSIS OF LEFT KNEE  PROCEDURE:  Procedure(s): COMPUTER ASSISTED TOTAL KNEE ARTHROPLASTY (Left)  SURGEON:  Surgeon(s) and Role:    * Dereck Leep, MD - Primary  ASSISTANTS: Vance Peper, PA   ANESTHESIA:   spinal  EBL:  Total I/O In: 1150 [I.V.:1150] Out: 350 [Urine:300; Blood:50]  BLOOD ADMINISTERED:none  DRAINS: 2 medium drains to a reinfusion system   LOCAL MEDICATIONS USED:  MARCAINE    and OTHER Exparel  SPECIMEN:  No Specimen  DISPOSITION OF SPECIMEN:  N/A  COUNTS:  YES  TOURNIQUET:   82 minutes  DICTATION: .Dragon Dictation  PLAN OF CARE: Admit to inpatient   PATIENT DISPOSITION:  PACU - hemodynamically stable.   Delay start of Pharmacological VTE agent (>24hrs) due to surgical blood loss or risk of bleeding: yes

## 2015-08-27 NOTE — H&P (Signed)
The patient has been re-examined, and the chart reviewed, and there have been no interval changes to the documented history and physical.    The risks, benefits, and alternatives have been discussed at length. The patient expressed understanding of the risks benefits and agreed with plans for surgical intervention.  James P. Hooten, Jr. M.D.    

## 2015-08-27 NOTE — NC FL2 (Signed)
[place] LEVEL OF CARE SCREENING TOOL     IDENTIFICATION  Patient Name: Shannon Blake Birthdate: 06-15-1955 Sex: female Admission Date (Current Location): 08/27/2015  [place] and [place] Number:  Engineering geologist and Address:  [facility], [address] [place], [place] [address]      Provider Number: [id number]  Attending Physician Name and Address:  Dereck Leep, MD  Relative Name and Phone Number:       Current Level of Care: Hospital Recommended Level of Care: [place] Prior Approval Number:    Date Approved/Denied:   PASRR Number:  ([id number])  Discharge Plan: SNF    Current Diagnoses: Patient Active Problem List   Diagnosis Date Noted  . S/P total knee arthroplasty 08/27/2015  . Calculus of kidney 10/17/2014  . Alimentary obesity 10/17/2014  . Cough 10/17/2014  . Essential (primary) hypertension 10/17/2014  . Combined fat and carbohydrate induced hyperlipemia 10/17/2014  . Derangement of knee 12/01/2013  . Arthritis of knee, degenerative 12/01/2013    Orientation RESPIRATION BLADDER Height & Weight     Self, Time, Situation, Place  Normal Continent Weight: 228 lb (103.42 kg) Height:  5\' 2"  (157.5 cm)  BEHAVIORAL SYMPTOMS/MOOD NEUROLOGICAL BOWEL NUTRITION STATUS   (none )  (none ) Continent Diet (Diet: Clear Liquid )  AMBULATORY STATUS COMMUNICATION OF NEEDS Skin   Extensive Assist Verbally Surgical wounds (Incision: Left Knee )                       Personal Care Assistance Level of Assistance  Bathing, Feeding, Dressing Bathing Assistance: Limited assistance Feeding assistance: Independent Dressing Assistance: Limited assistance     Functional Limitations Info  Sight, Hearing, Speech Sight Info: Adequate Hearing Info: Adequate Speech Info: Adequate    SPECIAL CARE FACTORS FREQUENCY  PT (By licensed PT), OT (By licensed OT)     PT Frequency:  (5) OT Frequency:  (5)             Contractures      Additional Factors Info  Code Status, Allergies Code Status Info:  (Not on file. ) Allergies Info:  (No Known Allergies. )           Current Medications (08/27/2015):  This is the current hospital active medication list Current Facility-Administered Medications  Medication Dose Route Frequency Provider Last Rate Last Dose  . 0.9 %  sodium chloride infusion   Intravenous Continuous Dereck Leep, MD 100 mL/hr at 08/27/15 1223    . acetaminophen (OFIRMEV) IV 1,000 mg  1,000 mg Intravenous 4 times per day Dereck Leep, MD   1,000 mg at 08/27/15 1244  . acetaminophen (TYLENOL) tablet 650 mg  650 mg Oral Q6H PRN Dereck Leep, MD       Or  . acetaminophen (TYLENOL) suppository 650 mg  650 mg Rectal Q6H PRN Dereck Leep, MD      . alum & mag hydroxide-simeth (MAALOX/MYLANTA) 200-200-20 MG/5ML suspension 30 mL  30 mL Oral Q4H PRN Dereck Leep, MD      . amLODipine (NORVASC) tablet 5 mg  5 mg Oral Daily Dereck Leep, MD   5 mg at 08/27/15 1255  . benazepril (LOTENSIN) tablet 10 mg  10 mg Oral Daily Dereck Leep, MD   10 mg at 08/27/15 1256  . bisacodyl (DULCOLAX) suppository 10 mg  10 mg Rectal Daily PRN Dereck Leep, MD      .  calcium-vitamin D (OSCAL WITH D) 500-200 MG-UNIT per tablet 1 tablet  1 tablet Oral Daily Dereck Leep, MD   1 tablet at 08/27/15 1255  . ceFAZolin (ANCEF) 2-3 GM-% IVPB SOLR           . ceFAZolin (ANCEF) IVPB 2 g/50 mL premix  2 g Intravenous Q6H Dereck Leep, MD   2 g at 08/27/15 1246  . celecoxib (CELEBREX) capsule 200 mg  200 mg Oral Q12H Dereck Leep, MD   200 mg at 08/27/15 1252  . diphenhydrAMINE (BENADRYL) 12.5 MG/5ML elixir 12.5-25 mg  12.5-25 mg Oral Q4H PRN Dereck Leep, MD      . Derrill Memo ON 08/28/2015] enoxaparin (LOVENOX) injection 30 mg  30 mg Subcutaneous Q12H Dereck Leep, MD      . ferrous sulfate tablet 325 mg  325 mg Oral BID WC Dereck Leep, MD      . magnesium hydroxide (MILK OF MAGNESIA) suspension  30 mL  30 mL Oral Daily PRN Dereck Leep, MD      . menthol-cetylpyridinium (CEPACOL) lozenge 3 mg  1 lozenge Oral PRN Dereck Leep, MD       Or  . phenol (CHLORASEPTIC) mouth spray 1 spray  1 spray Mouth/Throat PRN Dereck Leep, MD      . metoCLOPramide (REGLAN) tablet 10 mg  10 mg Oral TID AC & HS Dereck Leep, MD   10 mg at 08/27/15 1252  . morphine 2 MG/ML injection 2 mg  2 mg Intravenous Q2H PRN Dereck Leep, MD   2 mg at 08/27/15 1223  . multivitamin with minerals tablet 1 tablet  1 tablet Oral Daily Dereck Leep, MD      . ondansetron (ZOFRAN) tablet 4 mg  4 mg Oral Q6H PRN Dereck Leep, MD       Or  . ondansetron (ZOFRAN) injection 4 mg  4 mg Intravenous Q6H PRN Dereck Leep, MD      . oxyCODONE (Oxy IR/ROXICODONE) immediate release tablet 5-10 mg  5-10 mg Oral Q4H PRN Dereck Leep, MD   5 mg at 08/27/15 1355  . pantoprazole (PROTONIX) EC tablet 40 mg  40 mg Oral BID Dereck Leep, MD   40 mg at 08/27/15 1252  . senna-docusate (Senokot-S) tablet 1 tablet  1 tablet Oral BID Dereck Leep, MD   1 tablet at 08/27/15 1257  . sodium phosphate (FLEET) 7-19 GM/118ML enema 1 enema  1 enema Rectal Once PRN Dereck Leep, MD      . traMADol Veatrice Bourbon) tablet 50-100 mg  50-100 mg Oral Q4H PRN Dereck Leep, MD         Discharge Medications: Please see discharge summary for a list of discharge medications.  Relevant Imaging Results:  Relevant Lab Results:   Additional Information  (SSN: [id number])  Loralyn Freshwater, LCSW

## 2015-08-27 NOTE — Anesthesia Postprocedure Evaluation (Signed)
Anesthesia Post Note  Patient: Shannon Blake  Procedure(s) Performed: Procedure(s) (LRB): COMPUTER ASSISTED TOTAL KNEE ARTHROPLASTY (Left)  Patient location during evaluation: PACU Anesthesia Type: General Level of consciousness: awake and alert Pain management: pain level controlled Vital Signs Assessment: post-procedure vital signs reviewed and stable Respiratory status: spontaneous breathing, nonlabored ventilation, respiratory function stable and patient connected to nasal cannula oxygen Cardiovascular status: blood pressure returned to baseline and stable Postop Assessment: no signs of nausea or vomiting Anesthetic complications: no    Last Vitals:  Filed Vitals:   08/27/15 1158 08/27/15 1227  BP: 122/71 122/54  Pulse: 58 70  Temp: 36.6 C 36.8 C  Resp: 18 18    Last Pain:  Filed Vitals:   08/27/15 1243  PainSc: [name]

## 2015-08-27 NOTE — Progress Notes (Signed)
Pt was able to ambulate to the chair.  Assisted by nursing back to chair.  She was able to sit up for about 1 hour

## 2015-08-27 NOTE — Op Note (Signed)
OPERATIVE NOTE  DATE OF SURGERY:  08/27/2015  PATIENT NAME:  TOMISHA KLEIN   DOB: 12/20/1955  MRN: [id number]  PRE-OPERATIVE DIAGNOSIS: Degenerative arthrosis of the left knee, primary  POST-OPERATIVE DIAGNOSIS:  Same  PROCEDURE:  Left total knee arthroplasty using computer-assisted navigation  SURGEON:  Marciano Sequin. M.D.  ASSISTANT:  Vance Peper, PA (present and scrubbed throughout the case, critical for assistance with exposure, retraction, instrumentation, and closure)  ANESTHESIA: spinal  ESTIMATED BLOOD LOSS: 50 mL  FLUIDS REPLACED: 1150 mL of crystalloid  TOURNIQUET TIME: 82 minutes  DRAINS: 2 medium drains to a reinfusion system  SOFT TISSUE RELEASES: Anterior cruciate ligament, posterior cruciate ligament, deep medial collateral ligament, patellofemoral ligament   IMPLANTS UTILIZED: DePuy PFC Sigma size 3 posterior stabilized femoral component (cemented), size 3 MBT tibial component (cemented), 35 mm 3 peg oval dome patella (cemented), and a 10 mm stabilized rotating platform polyethylene insert.  INDICATIONS FOR SURGERY: HAZEN VANDERHART is a 60 y.o. year old female with a long history of progressive knee pain. X-rays demonstrated severe degenerative changes in tricompartmental fashion. The patient had not seen any significant improvement despite conservative nonsurgical intervention. After discussion of the risks and benefits of surgical intervention, the patient expressed understanding of the risks benefits and agree with plans for total knee arthroplasty.   The risks, benefits, and alternatives were discussed at length including but not limited to the risks of infection, bleeding, nerve injury, stiffness, blood clots, the need for revision surgery, cardiopulmonary complications, among others, and they were willing to proceed.  PROCEDURE IN DETAIL: The patient was brought into the operating room and, after adequate spinal anesthesia was achieved, a tourniquet was placed on  the patient's upper thigh. The patient's knee and leg were cleaned and prepped with alcohol and DuraPrep and draped in the usual sterile fashion. A "timeout" was performed as per usual protocol. The lower extremity was exsanguinated using an Esmarch, and the tourniquet was inflated to 300 mmHg. An anterior longitudinal incision was made followed by a standard mid vastus approach. The deep fibers of the medial collateral ligament were elevated in a subperiosteal fashion off of the medial flare of the tibia so as to maintain a continuous soft tissue sleeve. The patella was subluxed laterally and the patellofemoral ligament was incised. Inspection of the knee demonstrated severe degenerative changes with full-thickness loss of articular cartilage. Osteophytes were debrided using a rongeur. Anterior and posterior cruciate ligaments were excised. Two 4.0 mm Schanz pins were inserted in the femur and into the tibia for attachment of the array of trackers used for computer-assisted navigation. Hip center was identified using a circumduction technique. Distal landmarks were mapped using the computer. The distal femur and proximal tibia were mapped using the computer. The distal femoral cutting guide was positioned using computer-assisted navigation so as to achieve a 5 distal valgus cut. The femur was sized and it was felt that a size 3 femoral component was appropriate. A size 3 femoral cutting guide was positioned and the anterior cut was performed and verified using the computer. This was followed by completion of the posterior and chamfer cuts. Femoral cutting guide for the central box was then positioned in the center box cut was performed.  Attention was then directed to the proximal tibia. Medial and lateral menisci were excised. The extramedullary tibial cutting guide was positioned using computer-assisted navigation so as to achieve a 0 varus-valgus alignment and 0 posterior slope. The cut was performed and  verified  using the computer. The proximal tibia was sized and it was felt that a size 3 tibial tray was appropriate. Tibial and femoral trials were inserted followed by insertion of a 4mm polyethylene insert. This allowed for excellent mediolateral soft tissue balancing both in flexion and in full extension. Finally, the patella was cut and prepared so as to accommodate a 35 mm 3 peg oval dome patella. A patella trial was placed and the knee was placed through a range of motion with excellent patellar tracking appreciated. The femoral trial was removed after debridement of posterior osteophytes. The central post-hole for the tibial component was reamed followed by insertion of a keel punch. Tibial trials were then removed. Cut surfaces of bone were irrigated with copious amounts of normal saline with antibiotic solution using pulsatile lavage and then suctioned dry. Polymethylmethacrylate cement with gentamicin was prepared in the usual fashion using a vacuum mixer. Cement was applied to the cut surface of the proximal tibia as well as along the undersurface of a size 3 MBT tibial component. Tibial component was positioned and impacted into place. Excess cement was removed using [employer] fast streamer. Cement was then applied to the cut surfaces of the femur as well as along the posterior flanges of the size 3 femoral component. The femoral component was positioned and impacted into place. Excess cement was removed using [employer] fast streamer. A 10 mm polyethylene trial was inserted and the knee was brought into full extension with steady axial compression applied. Finally, cement was applied to the backside of a 35 mm 3 peg oval dome patella and the patellar component was positioned and patellar clamp applied. Excess cement was removed using [employer] fast streamer. After adequate curing of the cement, the tourniquet was deflated after a total tourniquet time of 82 minutes. Hemostasis was achieved using electrocautery. The knee was  irrigated with copious amounts of normal saline with antibiotic solution using pulsatile lavage and then suctioned dry. 20 mL of 1.3% Exparel in 40 mL of normal saline was injected along the posterior capsule, medial and lateral gutters, and along the arthrotomy site. A 10 mm stabilized rotating platform polyethylene insert was inserted and the knee was placed through a range of motion with excellent mediolateral soft tissue balancing appreciated and excellent patellar tracking noted. 2 medium drains were placed in the wound bed and brought out through separate stab incisions to be attached to a reinfusion system. The medial parapatellar portion of the incision was reapproximated using interrupted sutures of #1 Vicryl. Subcutaneous tissue was then injected with a total of 30 cc of 0.25% Marcaine with epinephrine. Subcutaneous tissue was approximated in layers using first #0 Vicryl followed #2-0 Vicryl. The skin was approximated with skin staples. A sterile dressing was applied.  The patient tolerated the procedure well and was transported to the recovery room in stable condition.    James P. Holley Bouche., M.D.

## 2015-08-28 LAB — BASIC METABOLIC PANEL
ANION GAP: 6 (ref 5–15)
BUN: 11 mg/dL (ref 6–20)
CALCIUM: 8 mg/dL — AB (ref 8.9–10.3)
CO2: 20 mmol/L — ABNORMAL LOW (ref 22–32)
CREATININE: 0.78 mg/dL (ref 0.44–1.00)
Chloride: 106 mmol/L (ref 101–111)
GFR calc non Af Amer: >60 mL/min (ref >60)
Glucose, Bld: 93 mg/dL (ref 65–99)
Potassium: 4.1 mmol/L (ref 3.5–5.1)
SODIUM: 132 mmol/L — AB (ref 135–145)

## 2015-08-28 LAB — CBC
HCT: 35.7 % (ref 35.0–47.0)
HEMOGLOBIN: 11.9 g/dL — AB (ref 12.0–16.0)
MCH: 26.8 pg (ref 26.0–34.0)
MCHC: 33.4 g/dL (ref 32.0–36.0)
MCV: 80.3 fL (ref 80.0–100.0)
Platelets: 235 10*3/uL (ref 150–440)
RBC: 4.44 MIL/uL (ref 3.80–5.20)
RDW: 14.2 % (ref 11.5–14.5)
WBC: 5.5 10*3/uL (ref 3.6–11.0)

## 2015-08-28 NOTE — Evaluation (Signed)
Occupational Therapy Evaluation Patient Details Name: Shannon Blake MRN: [id number] DOB: 1955/06/14 Today's Date: 08/28/2015    History of Present Illness Pt underwent L TKR without reported post-op complications. POD#0 at time of initial evaluation. Pt reports high levels of pain at time of evaluation. She was premedicated before session and also provided morphine during PT session. Pt denies falls in the last 12 months. She works as Quarry manager at [employer] and plans to discharge home after hospital stay   Clinical Impression   Pt is 60 year old female s/p L TKR.  Pt was independent in all ADLs prior to surgery working as CNA at [employer] and is eager to return to [employer]. She lives with her husband and son who is able to help during the day as needed.  Pt currently requires moderate assist for LB dressing while in seated position due to pain and limited AROM of L knee.  Pt would benefit from instruction in dressing techniques with or without assistive devices for dressing and bathing skills.  Pt would also benefit from recommendations for home modifications to increase safety in the bathroom and prevent falls. Will assess for OT [facility] needs as pt progresses in therapy.       Follow Up Recommendations   (will assess for OT HH needs but most likely will not need it)    Equipment Recommendations       Recommendations for Other Services       Precautions / Restrictions Precautions Precautions: Knee;Fall Precaution Booklet Issued: Yes (comment) Required Braces or Orthoses: Knee Immobilizer - Left Knee Immobilizer - Left: Discontinue once straight leg raise with < 10 degree lag Restrictions Weight Bearing Restrictions: Yes LLE Weight Bearing: Weight bearing as tolerated      Mobility Bed Mobility Overal bed mobility: Needs Assistance Bed Mobility: Sit to Supine       Sit to supine: Supervision   General bed mobility comments: Pt received upright in recliner. At end of session  returned to bed. Educated about proper sequencing. Pt demonstrates good L hip flexion and knee extension strength. She is able to return to bed without assist from therapist  Transfers Overall transfer level: Needs assistance Equipment used: Rolling walker (2 wheeled) Transfers: Sit to/from Stand Sit to Stand: Min guard         General transfer comment: Pt demonstrates improving speed and sequencing as well as weight shifting to LLE during transfer. Improved L knee flexion and no reported increase in pain.     Balance Overall balance assessment: Needs assistance Sitting-balance support: No upper extremity supported Sitting balance-Leahy Scale: Good     Standing balance support: Bilateral upper extremity supported Standing balance-Leahy Scale: Fair                              ADL Overall ADL's : Needs assistance/impaired                                       General ADL Comments: Pt currently requires mod assist and cues for use of reacher and sock aid for LB dressing skills mainly due to pain; independent after set up for feeding and UB bathing and dressing skills     Vision     Perception     Praxis      Pertinent Vitals/Pain Pain Assessment: 0-10 Pain Score:  9  Pain Location: L knee; just finished with PT Pain Descriptors / Indicators: Aching;Constant;Sore Pain Intervention(s): Limited activity within patient's tolerance;Monitored during session;RN gave pain meds during session;Ice applied     Hand Dominance Right   Extremity/Trunk Assessment Upper Extremity Assessment Upper Extremity Assessment: Overall WFL for tasks assessed   Lower Extremity Assessment Lower Extremity Assessment: Defer to PT evaluation       Communication Communication Communication: No difficulties   Cognition Arousal/Alertness: Awake/alert Behavior During Therapy: WFL for tasks assessed/performed Overall Cognitive Status: Within Functional Limits for  tasks assessed                     General Comments       Exercises Exercises: Total Joint     Shoulder Instructions      Home Living Family/patient expects to be discharged to:: Private residence Living Arrangements: Spouse/significant other Available Help at Discharge: Family Type of Home: House Home Access: Stairs to enter Technical brewer of Steps: 4 Entrance Stairs-Rails: Can reach both [place]: One level     Bathroom Shower/Tub: Tub/shower unit Shower/tub characteristics: Curtain Biochemist, clinical: Standard Bathroom Accessibility: Yes   Home Equipment: Environmental consultant - 2 wheels;Cane - single point;Bedside commode;Grab bars - tub/shower          Prior Functioning/Environment Level of Independence: Independent        Comments: Works as Quarry manager at [employer]. Drives and cares for all ADLs/IADLs    OT Diagnosis: Acute pain   OT Problem List: Decreased strength;Decreased range of motion;Decreased activity tolerance;Pain   OT Treatment/Interventions: Self-care/ADL training;DME and/or AE instruction;Patient/family education    OT Goals(Current goals can be found in the care plan section) Acute Rehab OT Goals Patient Stated Goal: "to go  home and get back to moving around again" OT Goal Formulation: With patient Time For Goal Achievement: 09/11/15 Potential to Achieve Goals: Good  OT Frequency: Min 1X/week   Barriers to D/C:            Co-evaluation              End of Session    Activity Tolerance: Patient tolerated treatment well Patient left: in bed;with call bell/phone within reach;with bed alarm set   Time: [id number] OT Time Calculation (min): 29 min Charges:  OT General Charges $OT Visit: 1 Procedure OT Evaluation $OT Eval Moderate Complexity: 1 Procedure OT Treatments $Self Care/Home Management : 8-22 mins G-Codes:    Wofford,Susan Aug 31, 2015, 11:20 AM   Chrys Racer, OTR/L ascom [phone]

## 2015-08-28 NOTE — Progress Notes (Signed)
Clinical Social Worker (CSW) received SNF consult. PT is recommending home health. RN Case Manager is aware of above. Please reconsult if future social work needs arise. CSW signing off.   Kvon Mcilhenny Morgan, LCSW [phone] 

## 2015-08-28 NOTE — Progress Notes (Signed)
   Subjective: 1 Day Post-Op Procedure(s) (LRB): COMPUTER ASSISTED TOTAL KNEE ARTHROPLASTY (Left) Patient reports pain as mild.  Better during the night. A lot of pain yesterday after surgery Patient is well, and has had no acute complaints or problems We will start therapy today.  Plan is to go Home after hospital stay. no nausea and no vomiting Patient denies any chest pains or shortness of breath. Objective: Vital signs in last 24 hours: Temp:  [97.3 F (36.3 C)-99.1 F (37.3 C)] 97.8 F (36.6 C) (03/21 0422) Pulse Rate:  [55-76] 62 (03/21 0422) Resp:  [12-18] 16 (03/21 0422) BP: (92-135)/(54-73) 122/73 mmHg (03/21 0422) SpO2:  [96 %-100 %] 100 % (03/21 0422) Patients has original dressing on. Unable to evaluate the wound Heels are non tender and elevated off the bed using rolled towels Intake/Output from previous day: 03/20 0701 - 03/21 0700 In: 4126.7 [P.O.:360; I.V.:2761.7; IV Piggyback:660] Out: [id number] [Urine:1025; Drains:90; Blood:50] Intake/Output this shift: Total I/O In: 1410 [I.V.:1110; IV Piggyback:300] Out: 690 [Urine:600; Drains:90]   Recent Labs  08/28/15 0553  HGB 11.9*    Recent Labs  08/28/15 0553  WBC 5.5  RBC 4.44  HCT 35.7  PLT 235    Recent Labs  08/28/15 0553  NA 132*  K 4.1  CL 106  CO2 20*  BUN 11  CREATININE 0.78  GLUCOSE 93  CALCIUM 8.0*   No results for input(s): LABPT, INR in the last 72 hours.  EXAM General - Patient is Alert, Appropriate and Oriented Extremity - Neurologically intact Neurovascular intact Sensation intact distally Intact pulses distally Dorsiflexion/Plantar flexion intact Dressing - dressing C/D/I Motor Function - intact, moving foot and toes well on exam. Able to do SLR on own.  Past Medical History  Diagnosis Date  . Hypertension   . Arthritis     Assessment/Plan: 1 Day Post-Op Procedure(s) (LRB): COMPUTER ASSISTED TOTAL KNEE ARTHROPLASTY (Left) Active Problems:   S/P total knee  arthroplasty  Estimated body mass index is 41.69 kg/(m^2) as calculated from the following:   Height as of this encounter: 5\' 2"  (1.575 m).   Weight as of this encounter: 103.42 kg (228 lb). Advance diet Up with therapy D/C IV fluids Plan for discharge tomorrow Discharge home with home health  Labs: reviewed DVT Prophylaxis - Lovenox, Foot Pumps and TED hose Weight-Bearing as tolerated to left leg D/C O2 and Pulse OX and try on Room Air Begin working on a bowel movement Labs in am  [employer]. Wallace [place] 08/28/2015, 6:44 AM

## 2015-08-28 NOTE — Progress Notes (Signed)
Physical Therapy Treatment Patient Details Name: Shannon Blake MRN: [id number] DOB: 07/08/1955 Today's Date: 08/28/2015    History of Present Illness Pt underwent L TKR without reported post-op complications. POD#0 at time of initial evaluation. Pt reports high levels of pain at time of evaluation. She was premedicated before session and also provided morphine during PT session. Pt denies falls in the last 12 months. 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 She elects to progress ambulation distance to complete full lap around RN station and is also eager to perform stairs. Gait normalizes throughout distance with improving R step length as well as L heel strike and push off. Pt able to complete ascend/descend of 4 stairs with good safety and stability noted. At this point pt has met all PT barriers to discharge having complete lap around RN station and stair training. Will continue to progress ambulation distance as well as repeat stair training as needed. Will discuss car transfers. Pt will benefit from skilled PT services to address deficits in strength, balance, and mobility in order to return to full function at home.    Follow Up Recommendations  Home health PT     Equipment Recommendations  None recommended by PT (Pt already has rolling walker at home)    Recommendations for Other Services       Precautions / Restrictions Precautions Precautions: Knee;Fall Precaution Booklet Issued: Yes (comment) Required Braces or Orthoses: Knee Immobilizer - Left Knee Immobilizer - Left: Discontinue once straight leg raise with < 10 degree lag Restrictions Weight Bearing Restrictions: Yes LLE Weight Bearing: Weight bearing as tolerated    Mobility  Bed Mobility Overal bed mobility: Needs Assistance Bed Mobility: Sit to Supine       Sit to supine:  Supervision   General bed mobility comments: Pt received upright in recliner. Good speed and sequencing noted when returning to bed  Transfers Overall transfer level: Needs assistance Equipment used: Rolling walker (2 wheeled) Transfers: Sit to/from Stand Sit to Stand: Supervision         General transfer comment: Continues to demonstrate improving speed and sequencing with transfers. Improving weight shifting and good stability noted. No safety concerns  Ambulation/Gait Ambulation/Gait assistance: Min guard Ambulation Distance (Feet): 250 Feet Assistive device: Rolling walker (2 wheeled) Gait Pattern/deviations: Step-through pattern Gait velocity: Decreased Gait velocity interpretation: <1.8 ft/sec, indicative of risk for recurrent falls General Gait Details: Pt provided cues for step-through pattern including improved heel strike and push off. Fatigue and pain monitored throughout ambulation distance. Pt elects to perform full lap around RN station and demonstrates improving speed throughout distance   Stairs Stairs: Yes Stairs assistance: Min guard Stair Management: Two rails;Step to pattern Number of Stairs: 4 General stair comments: Pt provided education for proper sequencing with stairs. She is able to demonstrates safe ascend/descend without evidence for imbalance and no LE buckling. Good safety noted  Wheelchair Mobility    Modified Rankin (Stroke Patients Only)       Balance Overall balance assessment: Needs assistance Sitting-balance support: No upper extremity supported Sitting balance-Leahy Scale: Good     Standing balance support: Bilateral upper extremity supported Standing balance-Leahy Scale: Fair Standing balance comment: Pt able to maintain static standing balance without UE assist  Cognition Arousal/Alertness: Awake/alert Behavior During Therapy: WFL for tasks assessed/performed Overall Cognitive Status: Within  Functional Limits for tasks assessed                      Exercises Total Joint Exercises Ankle Circles/Pumps: Strengthening;Both;Seated;15 reps (Heel raises) Hip ABduction/ADduction: Strengthening;Both;15 reps;Seated Long Arc Quad: Strengthening;Left;Seated;15 reps Knee Flexion: Strengthening;Left;Seated;15 reps Marching in Standing: Strengthening;Both;Seated;15 reps    General Comments        Pertinent Vitals/Pain Pain Assessment: 0-10 Pain Score: 7  Pain Location: L knee, increases to 9/10 following ambulation Pain Descriptors / Indicators: Aching Pain Intervention(s): Monitored during session;Patient requesting pain meds-RN notified    Home Living                      Prior Function            PT Goals (current goals can now be found in the care plan section) Acute Rehab PT Goals Patient Stated Goal: "to go  home and get back to moving around again" PT Goal Formulation: With patient Time For Goal Achievement: 09/10/15 Potential to Achieve Goals: Good    Frequency  BID    PT Plan Current plan remains appropriate    Co-evaluation             End of Session Equipment Utilized During Treatment: Gait belt Activity Tolerance: Patient tolerated treatment well Patient left: with call bell/phone within reach;with family/visitor present;with SCD's reapplied;in bed;with bed alarm set (towel under heel, polar care in place, received in chair)     Time: 9167-5612 PT Time Calculation (min) (ACUTE ONLY): 25 min  Charges:  $Gait Training: 8-22 mins $Therapeutic Exercise: 8-22 mins                    G Codes:      Shannon Blake PT, DPT   Shannon Blake 08/28/2015, 5:08 PM

## 2015-08-28 NOTE — Progress Notes (Signed)
Physical Therapy Treatment Patient Details Name: Shannon Blake MRN: [id number] DOB: 07-09-1955 Today's Date: 08/28/2015    History of Present Illness Pt underwent L TKR without reported post-op complications. POD#0 at time of initial evaluation. Pt reports high levels of pain at time of evaluation. She was premedicated before session and also provided morphine during PT session. Pt denies falls in the last 12 months. 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 Improving speed and sequencing with transfers with increased weight acceptance to LLE. Pt able to progress ambulation distance and walks to RN station and back to bed. AAROM is progressing nicely and while pt reports high levels of pain she tolerates well. Pt requests return to bed after AM session as she was already up in recliner prior to PT session. Agrees to get up again later this AM. Will progress ambulation distance and ther-ex during PM session. Pt will benefit from skilled PT services to address deficits in strength, balance, and mobility in order to return to full function at home.    Follow Up Recommendations  Home health PT     Equipment Recommendations  None recommended by PT (Pt already has rolling walker at home)    Recommendations for Other Services       Precautions / Restrictions Precautions Precautions: Knee;Fall Precaution Booklet Issued: Yes (comment) Required Braces or Orthoses: Knee Immobilizer - Left Knee Immobilizer - Left: Discontinue once straight leg raise with < 10 degree lag Restrictions Weight Bearing Restrictions: Yes LLE Weight Bearing: Weight bearing as tolerated    Mobility  Bed Mobility Overal bed mobility: Needs Assistance Bed Mobility: Sit to Supine       Sit to supine: Supervision   General bed mobility comments: Pt received upright in recliner. At end of  session returned to bed. Educated about proper sequencing. Pt demonstrates good L hip flexion and knee extension strength. She is able to return to bed without assist from therapist  Transfers Overall transfer level: Needs assistance Equipment used: Rolling walker (2 wheeled) Transfers: Sit to/from Stand Sit to Stand: Min guard         General transfer comment: Pt demonstrates improving speed and sequencing as well as weight shifting to LLE during transfer. Improved L knee flexion and no reported increase in pain.   Ambulation/Gait Ambulation/Gait assistance: Min guard Ambulation Distance (Feet): 80 Feet Assistive device: Rolling walker (2 wheeled) Gait Pattern/deviations: Step-to pattern;Decreased stance time - left;Decreased weight shift to left Gait velocity: Decreased Gait velocity interpretation: <1.8 ft/sec, indicative of risk for recurrent falls General Gait Details: Pt educated again about proper sequencing with rolling walker. Starts with step-to pattern but able to progress with cues to partial step-through pattern. Pt reports mild increase in pain from 7/10 at rest to 8/10 during ambulation in L knee. Denies DOE and denies dizziness during transfers and ambulation   Financial trader Rankin (Stroke Patients Only)       Balance Overall balance assessment: Needs assistance Sitting-balance support: No upper extremity supported Sitting balance-Leahy Scale: Good     Standing balance support: Bilateral upper extremity supported Standing balance-Leahy Scale: Fair                      Cognition Arousal/Alertness: Awake/alert Behavior During  Therapy: WFL for tasks assessed/performed Overall Cognitive Status: Within Functional Limits for tasks assessed                      Exercises Total Joint Exercises Ankle Circles/Pumps: Strengthening;Both;Supine;15 reps (Also heel raises x 10 bilateral) Quad Sets:  Strengthening;Both;Supine;15 reps Hip ABduction/ADduction: Strengthening;Both;15 reps;Seated Long Arc Quad: Strengthening;Left;10 reps;Seated Knee Flexion: Strengthening;Left;10 reps;Seated Goniometric ROM: -5 to 88 degrees AAROM, pain limited    General Comments        Pertinent Vitals/Pain Pain Assessment: 0-10 Pain Score: 7  Pain Location: L knee, increases to 8/10 with ambulation Pain Intervention(s): Premedicated before session;Monitored during session;Limited activity within patient's tolerance;Patient requesting pain meds-RN notified    Home Living                      Prior Function            PT Goals (current goals can now be found in the care plan section) Acute Rehab PT Goals Patient Stated Goal: Return home with improved function and less pain PT Goal Formulation: With patient Time For Goal Achievement: 09/10/15 Potential to Achieve Goals: Good Progress towards PT goals: Progressing toward goals    Frequency  BID    PT Plan Current plan remains appropriate    Co-evaluation             End of Session Equipment Utilized During Treatment: Gait belt Activity Tolerance: Patient tolerated treatment well Patient left: with call bell/phone within reach;with family/visitor present;with SCD's reapplied;in bed;with bed alarm set (towel under heel, polar care in place, received in chair)     Time: [id number] PT Time Calculation (min) (ACUTE ONLY): 26 min  Charges:  $Gait Training: 8-22 mins $Therapeutic Exercise: 8-22 mins                    G Codes:      Lyndel Safe Zaira Iacovelli PT, DPT   Chima [place] 08/28/2015, 9:37 AM

## 2015-08-29 LAB — BASIC METABOLIC PANEL
Anion gap: 6 (ref 5–15)
BUN: 8 mg/dL (ref 6–20)
CALCIUM: 8.9 mg/dL (ref 8.9–10.3)
CO2: 24 mmol/L (ref 22–32)
CREATININE: 0.65 mg/dL (ref 0.44–1.00)
Chloride: 106 mmol/L (ref 101–111)
GFR calc Af Amer: >60 mL/min (ref >60)
GFR calc non Af Amer: >60 mL/min (ref >60)
GLUCOSE: 108 mg/dL — AB (ref 65–99)
Potassium: 4.2 mmol/L (ref 3.5–5.1)
Sodium: 136 mmol/L (ref 135–145)

## 2015-08-29 LAB — CBC
HEMATOCRIT: 38 % (ref 35.0–47.0)
Hemoglobin: 12.7 g/dL (ref 12.0–16.0)
MCH: 26.8 pg (ref 26.0–34.0)
MCHC: 33.4 g/dL (ref 32.0–36.0)
MCV: 80.1 fL (ref 80.0–100.0)
PLATELETS: 265 10*3/uL (ref 150–440)
RBC: 4.74 MIL/uL (ref 3.80–5.20)
RDW: 14.1 % (ref 11.5–14.5)
WBC: 6.8 10*3/uL (ref 3.6–11.0)

## 2015-08-29 MED ORDER — CELECOXIB 200 MG PO CAPS: 200.0000 mg | ORAL_CAPSULE | Freq: Two times a day (BID) | ORAL | Status: DC

## 2015-08-29 MED ORDER — OXYCODONE HCL 5 MG PO TABS: 5.0000 mg | ORAL_TABLET | ORAL | Status: DC | PRN

## 2015-08-29 MED ORDER — TRAMADOL HCL 50 MG PO TABS: 50.0000 mg | ORAL_TABLET | ORAL | Status: DC | PRN

## 2015-08-29 MED ORDER — ENOXAPARIN SODIUM 40 MG/0.4ML ~~LOC~~ SOLN: 40.0000 mg | SUBCUTANEOUS | Status: DC

## 2015-08-29 NOTE — Discharge Instructions (Signed)

## 2015-08-29 NOTE — Care Management (Signed)
I have notified Arville Go that patient is discharging home today. No further RNCM needs. Case closed.

## 2015-08-29 NOTE — Progress Notes (Signed)
Occupational Therapy Treatment Patient Details Name: Shannon Blake MRN: [id number] DOB: 1955/12/26 Today's Date: 08/29/2015    History of present illness Pt underwent L TKR without reported post-op complications. POD#0 at time of initial evaluation. Pt reports high levels of pain at time of evaluation. She was premedicated before session and also provided morphine during PT session. Pt denies falls in the last 12 months. 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  Pt to go home today and reviewed safe set up to prevent falls and toilet transfers which she is now doing with supervision and pain is much less today (2/10).  All goals met for OT and no follow up recommended.  Follow Up Recommendations  No OT follow up    Equipment Recommendations       Recommendations for Other Services      Precautions / Restrictions Precautions Precautions: Knee;Fall Precaution Booklet Issued: Yes (comment) Required Braces or Orthoses: Knee Immobilizer - Left Knee Immobilizer - Left: Discontinue once straight leg raise with < 10 degree lag Restrictions Weight Bearing Restrictions: Yes LLE Weight Bearing: Weight bearing as tolerated       Mobility Bed Mobility Overal bed mobility: Needs Assistance Bed Mobility: Supine to Sit     Supine to sit: Supervision     General bed mobility comments: Pt with good speed, sequencing, and safety  Transfers Overall transfer level: Needs assistance Equipment used: Rolling walker (2 wheeled)   Sit to Stand: Supervision         General transfer comment: Continues to demonstrate improving speed and sequencing with transfers. Improving weight shifting and good stability noted. No safety concerns    Balance     Sitting balance-Leahy Scale: Good       Standing balance-Leahy Scale: Fair                      ADL Overall ADL's : Modified independent                                       General ADL Comments: Pt making good progress with knee ROM and able to reach feet now to complete LB dressing skills without AD.  She is able to transfer on and off toilet with supervision.  Pt to go home today.      Vision                     Perception     Praxis      Cognition   Behavior During Therapy: WFL for tasks assessed/performed Overall Cognitive Status: Within Functional Limits for tasks assessed                       Extremity/Trunk Assessment               Exercises Total Joint Exercises Ankle Circles/Pumps: Strengthening;Both;Seated;15 reps (Heel raises) Quad Sets: Strengthening;Both;Supine;15 reps Gluteal Sets: Strengthening;Both;10 reps;Supine Towel Squeeze: Strengthening;Both;10 reps;Supine Short Arc Quad: Strengthening;Left;10 reps;Supine Heel Slides: Strengthening;Left;10 reps;Supine Hip ABduction/ADduction: Strengthening;Both;15 reps;Seated Straight Leg Raises: Strengthening;Left;10 reps;Supine Long Arc Quad: Strengthening;Left;Seated;15 reps Knee Flexion: Strengthening;Left;Seated;15 reps Marching in Standing: Strengthening;Both;Seated;15 reps   Shoulder Instructions       General Comments  Pertinent Vitals/ Pain       Pain Assessment: 0-10 Pain Score: 6  Pain Location: L knee, increases to 7/10 with ambulation Pain Descriptors / Indicators: Aching Pain Intervention(s): Monitored during session;Premedicated before session;Limited activity within patient's tolerance  Home Living                                          Prior Functioning/Environment              Frequency Min 1X/week     Progress Toward Goals  OT Goals(current goals can now be found in the care plan section)  Progress towards OT goals: Progressing toward goals  Acute Rehab OT Goals Patient Stated Goal: "to go  home and get  back to moving around again" OT Goal Formulation: With patient Time For Goal Achievement: 09/11/15 Potential to Achieve Goals: Good  Plan Discharge plan remains appropriate    Co-evaluation                 End of Session     Activity Tolerance Patient tolerated treatment well   Patient Left in chair;with call bell/phone within reach;with chair alarm set   Nurse Communication          Time: 7022-0266 OT Time Calculation (min): 15 min  Charges: OT General Charges $OT Visit: 1 Procedure OT Treatments $Self Care/Home Management : 8-22 mins  Kasy [place] 08/29/2015, 10:02 AM  Chrys Racer, OTR/L ascom [phone]

## 2015-08-29 NOTE — Discharge Summary (Signed)
Physician Discharge Summary  Subjective: 2 Days Post-Op Procedure(s) (LRB): COMPUTER ASSISTED TOTAL KNEE ARTHROPLASTY (Left) Patient reports pain as mild.   Patient seen in rounds with Dr. Marry Guan. Patient is well, and has had no acute complaints or problems Patient is ready to go home with home health physical therapy  Physician Discharge Summary  Patient ID: Shannon Blake MRN: [id number] DOB/AGE: [birth year] 60 y.o.  Admit date: 08/27/2015 Discharge date: 08/29/2015  Admission Diagnoses:  Discharge Diagnoses:  Active Problems:   S/P total knee arthroplasty   Discharged Condition: good  Hospital Course: The patient is postop day 2 from a left total knee replacement done by Dr. Marry Guan. The patient is doing very well since surgery. She has normal labs since surgery. She ambulated 250 feet on postop day 1 with physical therapy. She is able to do a straight leg raise comfortably. She had a dressing change on the day of discharge. She is ready to go home since she has had a bowel movement.  Treatments: surgery:  PROCEDURE: Left total knee arthroplasty using computer-assisted navigation  SURGEON: Marciano Sequin. M.D.  ASSISTANT: Vance Peper, PA (present and scrubbed throughout the case, critical for assistance with exposure, retraction, instrumentation, and closure)  ANESTHESIA: spinal  ESTIMATED BLOOD LOSS: 50 mL  FLUIDS REPLACED: 1150 mL of crystalloid  TOURNIQUET TIME: 82 minutes  DRAINS: 2 medium drains to a reinfusion system  SOFT TISSUE RELEASES: Anterior cruciate ligament, posterior cruciate ligament, deep medial collateral ligament, patellofemoral ligament   IMPLANTS UTILIZED: DePuy PFC Sigma size 3 posterior stabilized femoral component (cemented), size 3 MBT tibial component (cemented), 35 mm 3 peg oval dome patella (cemented), and a 10 mm stabilized rotating platform polyethylene insert.  Discharge Exam: Blood pressure 159/74, pulse 100, temperature 98.1 F (36.7  C), temperature source Oral, resp. rate 18, height 5\' 2"  (1.575 m), weight 103.42 kg (228 lb), SpO2 97 %.   Disposition: Final discharge disposition not confirmed     Medication List    TAKE these medications        amLODipine-benazepril 5-10 MG capsule  Commonly known as:  LOTREL  Take 1 capsule by mouth daily.     aspirin 81 MG chewable tablet  Chew 81 mg by mouth daily.     CALCIUM 600 + D PO  Take 1 tablet by mouth daily.     celecoxib 200 MG capsule  Commonly known as:  CELEBREX  Take 1 capsule (200 mg total) by mouth every 12 (twelve) hours.     CENTRUM SILVER ULTRA WOMENS Tabs  Take 1 tablet by mouth daily.     enoxaparin 40 MG/0.4ML injection  Commonly known as:  LOVENOX  Inject 0.4 mLs (40 mg total) into the skin daily.     GLUCOSAMINE-CHONDROITIN PM 500-400 MG tablet  Generic drug:  glucosamine-chondroitin  Take 1 tablet by mouth 2 (two) times daily.     guaiFENesin-codeine 100-10 MG/5ML syrup  Commonly known as:  ROBITUSSIN AC  Take 5 mLs by mouth 3 (three) times daily as needed for cough.     meloxicam 7.5 MG tablet  Commonly known as:  MOBIC  Take 7.5 mg by mouth 2 (two) times daily after a meal.     oxyCODONE 5 MG immediate release tablet  Commonly known as:  Oxy IR/ROXICODONE  Take 1-2 tablets (5-10 mg total) by mouth every 4 (four) hours as needed for severe pain or breakthrough pain.     traMADol 50 MG tablet  Commonly known  as:  ULTRAM  Take 1-2 tablets (50-100 mg total) by mouth every 4 (four) hours as needed for moderate pain.           Follow-up Information    Follow up with [facility] R., PA On 09/11/2015.   Specialty:  Physician Assistant   Why:  at 9:15am   Contact information:   [address] [facility] [place] [address] [phone]       Follow up with Dereck Leep, MD On 10/09/2015.   Specialty:  Orthopedic Surgery   Why:  at 9:15am   Contact information:   [place] [address] [phone]       Signed: Prescott Parma, [name] 08/29/2015, 6:20 AM   Objective: Vital signs in last 24 hours: Temp:  [98.1 F (36.7 C)-98.8 F (37.1 C)] 98.1 F (36.7 C) (03/22 0414) Pulse Rate:  [69-100] 100 (03/22 0414) Resp:  [18] 18 (03/22 0414) BP: (129-159)/(56-82) 159/74 mmHg (03/22 0414) SpO2:  [96 %-99 %] 97 % (03/22 0414)  Intake/Output from previous day:  Intake/Output Summary (Last 24 hours) at 08/29/15 0620 Last data filed at 08/29/15 0547  Gross per 24 hour  Intake 911.67 ml  Output    120 ml  Net 791.67 ml    Intake/Output this shift: Total I/O In: 240 [P.O.:240] Out: -   Labs:  Recent Labs  08/28/15 0553  HGB 11.9*    Recent Labs  08/28/15 0553  WBC 5.5  RBC 4.44  HCT 35.7  PLT 235    Recent Labs  08/28/15 0553  NA 132*  K 4.1  CL 106  CO2 20*  BUN 11  CREATININE 0.78  GLUCOSE 93  CALCIUM 8.0*   No results for input(s): LABPT, INR in the last 72 hours.  EXAM: General - Patient is Alert and oriented Extremity - Sensation intact distally Intact pulses distally Dorsiflexion/Plantar flexion intact Compartment soft Incision - clean, dry, no drainage, with a dressing change and the honeycomb dressing intact. Motor Function -  the patient ambulated 250 feet and is able to do a straight leg raise. Her plantarflexion and dorsiflexion are intact.  Assessment/Plan: 2 Days Post-Op Procedure(s) (LRB): COMPUTER ASSISTED TOTAL KNEE ARTHROPLASTY (Left) Procedure(s) (LRB): COMPUTER ASSISTED TOTAL KNEE ARTHROPLASTY (Left) Past Medical History  Diagnosis Date  . Hypertension   . Arthritis    Active Problems:   S/P total knee arthroplasty  Estimated body mass index is 41.69 kg/(m^2) as calculated from the following:   Height as of this encounter: 5\' 2"  (1.575 m).   Weight as of this encounter: 103.42 kg (228 lb). Discharge home with home health Diet - Regular diet Follow up - in 2 weeks Activity -  WBAT Disposition - Home Condition Upon Discharge - Good DVT Prophylaxis - Lovenox and TED hose  Reche Dixon, PA-C Orthopaedic Surgery 08/29/2015, 6:20 AM

## 2015-08-29 NOTE — Progress Notes (Signed)
Order to discharge patient to home today with home health, discharge instructions given per MD order, home and new medications reviewed, rx.slips given, patient verbalized understanding of discharge instructions given.

## 2015-08-29 NOTE — Progress Notes (Signed)
Discharge via wheelchair with nursing staff at side.

## 2015-08-29 NOTE — Progress Notes (Signed)
Physical Therapy Treatment Patient Details Name: Shannon Blake MRN: [id number] DOB: 1955-12-18 Today's Date: 08/29/2015    History of Present Illness Pt underwent L TKR without reported post-op complications. POD#0 at time of initial evaluation. Pt reports high levels of pain at time of evaluation. She was premedicated before session and also provided morphine during PT session. Pt denies falls in the last 12 months. She works as Quarry manager at [employer] and plans to discharge home after hospital stay    PT Comments    Pt demonstrates excellent progress with therapist on this date. She is again able to complete lap around RN station and safely ascend/descend 4 stairs. Improving gait quality and speed. LLE strength is improved as well with patient progressing to standing exercises. L knee AAROM is progressing nicely at -2 to 101 degrees and is primarily limited by pain. Education completed regarding continued HEP at discharge as well as car transfers. Pt has met all PT barriers to discharge and is appropriate to DC home with family and [facility] PT when medically indicated. Pt will benefit from skilled PT services to address deficits in strength, balance, and mobility in order to return to full function at home.    Follow Up Recommendations  Home health PT     Equipment Recommendations  None recommended by PT (Pt already has rolling walker at home)    Recommendations for Other Services       Precautions / Restrictions Precautions Precautions: Knee;Fall Precaution Booklet Issued: Yes (comment) Required Braces or Orthoses: Knee Immobilizer - Left Knee Immobilizer - Left: Discontinue once straight leg raise with < 10 degree lag Restrictions Weight Bearing Restrictions: Yes LLE Weight Bearing: Weight bearing as tolerated    Mobility  Bed Mobility Overal bed mobility: Needs Assistance Bed Mobility: Supine to Sit     Supine to sit: Supervision     General bed mobility comments: Pt with good  speed, sequencing, and safety during bed mobility  Transfers Overall transfer level: Needs assistance Equipment used: Rolling walker (2 wheeled) Transfers: Sit to/from Stand Sit to Stand: Supervision         General transfer comment: Continues to demonstrate improving speed and sequencing with transfers. Improving weight shifting and good stability noted. No safety concerns  Ambulation/Gait Ambulation/Gait assistance: Supervision Ambulation Distance (Feet): 250 Feet Assistive device: Rolling walker (2 wheeled) Gait Pattern/deviations: Step-through pattern Gait velocity: Decreased Gait velocity interpretation: <1.8 ft/sec, indicative of risk for recurrent falls General Gait Details: Pt again provided cues to step-through pattern as well as heel strike and push off. Vitals monitored and remain WNL throughout ambulation. Encouraged decreased UE support during ambulation. Pt able to complete full lap around RN station. Overall gait quality and speed improve with increased distance   Stairs Stairs: Yes Stairs assistance: Min guard Stair Management: Two rails;Step to pattern Number of Stairs: 4 General stair comments: Pt able to recall proper sequencing without cues. Good approximation to steps with walker and good LE strength demonstrated with stairs. Pt with good stability and no safety concerns. No cues required to complete stairs today  Wheelchair Mobility    Modified Rankin (Stroke Patients Only)       Balance Overall balance assessment: Needs assistance Sitting-balance support: No upper extremity supported Sitting balance-Leahy Scale: Good     Standing balance support: Bilateral upper extremity supported Standing balance-Leahy Scale: Fair                      Cognition Arousal/Alertness: Awake/alert  Behavior During Therapy: WFL for tasks assessed/performed Overall Cognitive Status: Within Functional Limits for tasks assessed                       Exercises Total Joint Exercises Ankle Circles/Pumps: Strengthening;Both;15 reps;Supine Quad Sets: Strengthening;Both;Supine;15 reps Gluteal Sets: Strengthening;Both;Supine;15 reps Towel Squeeze: Strengthening;Both;Supine;15 reps Hip ABduction/ADduction: Strengthening;15 reps;Left;Supine Straight Leg Raises: Strengthening;Left;10 reps;Supine Goniometric ROM: -2 to 101 degrees AAROM, pain limited Marching in Standing: Strengthening;Both;10 reps;Standing Other Exercises Other Exercises: Standing mini squats x 10, standing heel raises x 10    General Comments        Pertinent Vitals/Pain Pain Assessment: 0-10 Pain Score: 6  Pain Location: L knee, increases to 7/10 with ambulation Pain Descriptors / Indicators: Aching Pain Intervention(s): Monitored during session;Premedicated before session;Limited activity within patient's tolerance    Home Living                      Prior Function            PT Goals (current goals can now be found in the care plan section) Acute Rehab PT Goals Patient Stated Goal: "to go  home and get back to moving around again" PT Goal Formulation: With patient Time For Goal Achievement: 09/10/15 Potential to Achieve Goals: Good Progress towards PT goals: Progressing toward goals    Frequency  BID    PT Plan Current plan remains appropriate    Co-evaluation             End of Session Equipment Utilized During Treatment: Gait belt Activity Tolerance: Patient tolerated treatment well Patient left: with call bell/phone within reach;with family/visitor present;in chair (Feet down for breakfast, agrees to set-up afterwards)     Time: 3149-7026 PT Time Calculation (min) (ACUTE ONLY): 25 min  Charges:  $Gait Training: 8-22 mins $Therapeutic Exercise: 8-22 mins                    G Codes:      Lyndel Safe Huprich PT, DPT   Huprich,Jason 08/29/2015, 10:05 AM

## 2015-08-29 NOTE — Progress Notes (Signed)
   Subjective: 2 Days Post-Op Procedure(s) (LRB): COMPUTER ASSISTED TOTAL KNEE ARTHROPLASTY (Left) Patient reports pain as mild.  Better during the night. Feeling much better after she has been moving around with physical therapy. Patient is well, and has had no acute complaints or problems We will start therapy today.  Plan is to go Home after hospital stay. The plan is to go home today. no nausea and no vomiting Patient denies any chest pains or shortness of breath. Positive bowel movement.  Objective: Vital signs in last 24 hours: Temp:  [98.1 F (36.7 C)-98.8 F (37.1 C)] 98.1 F (36.7 C) (03/22 0414) Pulse Rate:  [69-100] 100 (03/22 0414) Resp:  [18] 18 (03/22 0414) BP: (129-159)/(56-82) 159/74 mmHg (03/22 0414) SpO2:  [96 %-99 %] 97 % (03/22 0414) Patients has original dressing on. Unable to evaluate the wound Heels are non tender and elevated off the bed using rolled towels Intake/Output from previous day: 03/21 0701 - 03/22 0700 In: 911.7 [P.O.:480; I.V.:431.7] Out: 120 [Drains:120] Intake/Output this shift: Total I/O In: 240 [P.O.:240] Out: -    Recent Labs  08/28/15 0553  HGB 11.9*    Recent Labs  08/28/15 0553  WBC 5.5  RBC 4.44  HCT 35.7  PLT 235    Recent Labs  08/28/15 0553  NA 132*  K 4.1  CL 106  CO2 20*  BUN 11  CREATININE 0.78  GLUCOSE 93  CALCIUM 8.0*   No results for input(s): LABPT, INR in the last 72 hours.  EXAM General - Patient is Alert, Appropriate and Oriented Extremity - Neurologically intact Neurovascular intact Sensation intact distally Intact pulses distally Dorsiflexion/Plantar flexion intact Dressing - dressing C/D/I. The external dressing was removed. The honeycomb dressing is intact with no bleeding under the dressing. Motor Function - intact, moving foot and toes well on exam. Able to do SLR on own. The patient ambulated 250 feet with physical therapy.  Past Medical History  Diagnosis Date  . Hypertension    . Arthritis     Assessment/Plan: 2 Days Post-Op Procedure(s) (LRB): COMPUTER ASSISTED TOTAL KNEE ARTHROPLASTY (Left) Active Problems:   S/P total knee arthroplasty  Estimated body mass index is 41.69 kg/(m^2) as calculated from the following:   Height as of this encounter: 5\' 2"  (1.575 m).   Weight as of this encounter: 103.42 kg (228 lb). Discharge home with home health physical therapy today. 2 honeycomb dressings will be given to the patient for changing at home.  Labs: reviewed DVT Prophylaxis - Lovenox, Foot Pumps and TED hose Weight-Bearing as tolerated to left leg  Reche Dixon PA-C [place] 08/29/2015, 6:13 AM

## 2015-09-10 DIAGNOSIS — Z96652 Presence of left artificial knee joint: Secondary | ICD-10-CM | POA: Diagnosis not present

## 2015-09-10 DIAGNOSIS — I10 Essential (primary) hypertension: Secondary | ICD-10-CM | POA: Diagnosis not present

## 2015-09-10 DIAGNOSIS — Z7901 Long term (current) use of anticoagulants: Secondary | ICD-10-CM | POA: Diagnosis not present

## 2015-09-10 DIAGNOSIS — Z471 Aftercare following joint replacement surgery: Secondary | ICD-10-CM | POA: Diagnosis not present

## 2015-09-10 DIAGNOSIS — Z7982 Long term (current) use of aspirin: Secondary | ICD-10-CM | POA: Diagnosis not present

## 2015-09-10 DIAGNOSIS — M1990 Unspecified osteoarthritis, unspecified site: Secondary | ICD-10-CM | POA: Diagnosis not present

## 2015-09-10 DIAGNOSIS — Z79891 Long term (current) use of opiate analgesic: Secondary | ICD-10-CM | POA: Diagnosis not present

## 2015-09-11 DIAGNOSIS — Z96652 Presence of left artificial knee joint: Secondary | ICD-10-CM | POA: Diagnosis not present

## 2015-09-14 DIAGNOSIS — Z96652 Presence of left artificial knee joint: Secondary | ICD-10-CM | POA: Diagnosis not present

## 2015-09-18 DIAGNOSIS — Z96652 Presence of left artificial knee joint: Secondary | ICD-10-CM | POA: Diagnosis not present

## 2015-09-20 DIAGNOSIS — Z96652 Presence of left artificial knee joint: Secondary | ICD-10-CM | POA: Diagnosis not present

## 2015-09-25 DIAGNOSIS — Z96652 Presence of left artificial knee joint: Secondary | ICD-10-CM | POA: Diagnosis not present

## 2015-09-27 DIAGNOSIS — Z96652 Presence of left artificial knee joint: Secondary | ICD-10-CM | POA: Diagnosis not present

## 2015-10-02 DIAGNOSIS — Z96652 Presence of left artificial knee joint: Secondary | ICD-10-CM | POA: Diagnosis not present

## 2015-10-04 DIAGNOSIS — Z96652 Presence of left artificial knee joint: Secondary | ICD-10-CM | POA: Diagnosis not present

## 2015-10-09 DIAGNOSIS — Z96652 Presence of left artificial knee joint: Secondary | ICD-10-CM | POA: Diagnosis not present

## 2015-10-10 ENCOUNTER — Other Ambulatory Visit: Payer: Self-pay | Admitting: Obstetrics and Gynecology

## 2015-10-10 DIAGNOSIS — Z1231 Encounter for screening mammogram for malignant neoplasm of breast: Secondary | ICD-10-CM

## 2015-10-17 ENCOUNTER — Ambulatory Visit
Admission: RE | Admit: 2015-10-17 | Discharge: 2015-10-17 | Disposition: A | Discharge status: Home / Self Care | Payer: BLUE CROSS/BLUE SHIELD | Source: Ambulatory Visit | Attending: Obstetrics and Gynecology | Admitting: Obstetrics and Gynecology

## 2015-10-17 DIAGNOSIS — Z1231 Encounter for screening mammogram for malignant neoplasm of breast: Secondary | ICD-10-CM | POA: Diagnosis not present

## 2015-10-24 DIAGNOSIS — Z96652 Presence of left artificial knee joint: Secondary | ICD-10-CM | POA: Diagnosis not present

## 2015-11-26 DIAGNOSIS — Z471 Aftercare following joint replacement surgery: Secondary | ICD-10-CM | POA: Diagnosis not present

## 2015-12-31 ENCOUNTER — Ambulatory Visit (INDEPENDENT_AMBULATORY_CARE_PROVIDER_SITE_OTHER): Payer: BLUE CROSS/BLUE SHIELD | Admitting: Family Medicine

## 2015-12-31 ENCOUNTER — Encounter: Payer: Self-pay | Admitting: Family Medicine

## 2015-12-31 VITALS — BP 112/70 | HR 70 | Ht 62.0 in | Wt 224.0 lb

## 2015-12-31 DIAGNOSIS — I10 Essential (primary) hypertension: Secondary | ICD-10-CM | POA: Diagnosis not present

## 2015-12-31 MED ORDER — AMLODIPINE BESY-BENAZEPRIL HCL 5-10 MG PO CAPS: 1.0000 | ORAL_CAPSULE | Freq: Every day | ORAL | 6 refills | Status: DC

## 2015-12-31 NOTE — Progress Notes (Signed)
Name: Shannon Blake   MRN: [id number]    DOB: August 27, 1955   Date:12/31/2015       Progress Note  Subjective  Chief Complaint  Chief Complaint  Patient presents with  . Hypertension    refill meds x 6 months    Patient presents for htn check and medication refill.   Hypertension  This is a chronic problem. The current episode started more than 1 year ago. The problem has been gradually improving since onset. The problem is controlled. Associated symptoms include anxiety. Pertinent negatives include no blurred vision, chest pain, headaches, malaise/fatigue, neck pain, palpitations or shortness of breath. (Previous knee pain s/p surgery 08/27/15) There are no associated agents to hypertension. There are no known risk factors for coronary artery disease. Past treatments include ACE inhibitors and calcium channel blockers. The current treatment provides moderate improvement. There are no compliance problems.  There is no history of angina, kidney disease, CAD/MI, CVA, heart failure, left ventricular hypertrophy, PVD, renovascular disease or retinopathy. There is no history of chronic renal disease or a hypertension causing med.    No problem-specific Assessment & Plan notes found for this encounter.   Past Medical History:  Diagnosis Date  . Arthritis   . Hypertension     Past Surgical History:  Procedure Laterality Date  . CHOLECYSTECTOMY    . COLONOSCOPY  2007   Dr Candace Cruise  . GALLBLADDER SURGERY    . KNEE ARTHROPLASTY Left 08/27/2015   Procedure: COMPUTER ASSISTED TOTAL KNEE ARTHROPLASTY;  Surgeon: Dereck Leep, MD;  Location: [facility] ORS;  Service: Orthopedics;  Laterality: Left;  . meniscal surgery Left     Family History  Problem Relation Age of Onset  . Hypertension Mother   . Hypertension Father   . Cancer Sister     Social History   Social History  . Marital status: Single    Spouse name: N/A  . Number of children: N/A  . Years of education: N/A   Occupational History  .  Not on file.   Social History Main Topics  . Smoking status: Never Smoker  . Smokeless tobacco: Never Used  . Alcohol use No  . Drug use: No  . Sexual activity: Yes   Other Topics Concern  . Not on file   Social History Narrative  . No narrative on file    No Known Allergies   Review of Systems  Constitutional: Negative for chills, fever, malaise/fatigue and weight loss.  HENT: Negative for ear discharge, ear pain and sore throat.   Eyes: Negative for blurred vision.  Respiratory: Negative for cough, sputum production, shortness of breath and wheezing.   Cardiovascular: Negative for chest pain, palpitations and leg swelling.  Gastrointestinal: Negative for abdominal pain, blood in stool, constipation, diarrhea, heartburn, melena and nausea.  Genitourinary: Negative for dysuria, frequency, hematuria and urgency.  Musculoskeletal: Negative for back pain, joint pain, myalgias and neck pain.  Skin: Negative for rash.  Neurological: Negative for dizziness, tingling, sensory change, focal weakness and headaches.  Endo/Heme/Allergies: Negative for environmental allergies and polydipsia. Does not bruise/bleed easily.  Psychiatric/Behavioral: Negative for depression and suicidal ideas. The patient is not nervous/anxious and does not have insomnia.      Objective  Vitals:   12/31/15 0856  BP: 112/70  Pulse: 70  Weight: 224 lb (101.6 kg)  Height: 5\' 2"  (1.575 m)    Physical Exam  Constitutional: She is well-developed, well-nourished, and in no distress. No distress.  HENT:  Head: Normocephalic  and atraumatic.  Right Ear: External ear normal.  Left Ear: External ear normal.  Nose: Nose normal.  Mouth/Throat: Oropharynx is clear and moist.  Eyes: Conjunctivae and EOM are normal. Pupils are equal, round, and reactive to light. Right eye exhibits no discharge. Left eye exhibits no discharge.  Neck: Normal range of motion. Neck supple. No JVD present. No thyromegaly present.   Cardiovascular: Normal rate, regular rhythm, normal heart sounds and intact distal pulses.  Exam reveals no gallop and no friction rub.   No murmur heard. Pulmonary/Chest: Effort normal and breath sounds normal.  Abdominal: Soft. Bowel sounds are normal. She exhibits no mass. There is no tenderness. There is no guarding.  Musculoskeletal: Normal range of motion. She exhibits no edema.  Lymphadenopathy:    She has no cervical adenopathy.  Neurological: She is alert. She has normal reflexes.  Skin: Skin is warm and dry. She is not diaphoretic.  Psychiatric: Mood and affect normal.  Nursing note and vitals reviewed.     Assessment & Plan  Problem List Items Addressed This Visit      Cardiovascular and Mediastinum   Essential (primary) hypertension - Primary   Relevant Medications   amLODipine-benazepril (LOTREL) 5-10 MG capsule   Other Relevant Orders   Renal Function Panel    Other Visit Diagnoses   None.       Dr. Macon [facility]  12/31/15

## 2016-01-01 LAB — RENAL FUNCTION PANEL
Albumin: 4.4 g/dL (ref 3.6–4.8)
BUN / CREAT RATIO: 25 (ref 12–28)
BUN: 19 mg/dL (ref 8–27)
CO2: 22 mmol/L (ref 18–29)
CREATININE: 0.77 mg/dL (ref 0.57–1.00)
Calcium: 9.7 mg/dL (ref 8.7–10.3)
Chloride: 101 mmol/L (ref 96–106)
GFR, EST AFRICAN AMERICAN: 97 mL/min/{1.73_m2} (ref >59)
GFR, EST NON AFRICAN AMERICAN: 84 mL/min/{1.73_m2} (ref >59)
Glucose: 84 mg/dL (ref 65–99)
Phosphorus: 3.7 mg/dL (ref 2.5–4.5)
Potassium: 5 mmol/L (ref 3.5–5.2)
SODIUM: 140 mmol/L (ref 134–144)

## 2016-01-21 DIAGNOSIS — H2513 Age-related nuclear cataract, bilateral: Secondary | ICD-10-CM | POA: Diagnosis not present

## 2016-07-02 ENCOUNTER — Encounter: Payer: Self-pay | Admitting: Family Medicine

## 2016-07-02 ENCOUNTER — Ambulatory Visit (INDEPENDENT_AMBULATORY_CARE_PROVIDER_SITE_OTHER): Payer: BLUE CROSS/BLUE SHIELD | Admitting: Family Medicine

## 2016-07-02 VITALS — BP 130/64 | HR 68 | Ht 62.0 in | Wt 229.0 lb

## 2016-07-02 DIAGNOSIS — Z23 Encounter for immunization: Secondary | ICD-10-CM | POA: Diagnosis not present

## 2016-07-02 DIAGNOSIS — I10 Essential (primary) hypertension: Secondary | ICD-10-CM

## 2016-07-02 DIAGNOSIS — E782 Mixed hyperlipidemia: Secondary | ICD-10-CM

## 2016-07-02 MED ORDER — AMLODIPINE BESY-BENAZEPRIL HCL 5-10 MG PO CAPS: 1.0000 | ORAL_CAPSULE | Freq: Every day | ORAL | 2 refills | Status: DC

## 2016-07-02 NOTE — Progress Notes (Signed)
Name: Shannon Blake   MRN: [id number]    DOB: [birth year]   Date:07/02/2016       Progress Note  Subjective  Chief Complaint  Chief Complaint  Patient presents with  . Hypertension    Hypertension  This is a chronic problem. The current episode started more than 1 year ago. The problem has been gradually improving since onset. The problem is controlled. Pertinent negatives include no anxiety, blurred vision, chest pain, headaches, malaise/fatigue, neck pain, orthopnea, palpitations, peripheral edema, PND, shortness of breath or sweats. There are no associated agents to hypertension. Risk factors for coronary artery disease include obesity. Past treatments include ACE inhibitors and calcium channel blockers. The current treatment provides no improvement. There are no compliance problems.  There is no history of angina, kidney disease, CAD/MI, CVA, heart failure, left ventricular hypertrophy, PVD, renovascular disease or retinopathy. There is no history of chronic renal disease or a hypertension causing med.    No problem-specific Assessment & Plan notes found for this encounter.   Past Medical History:  Diagnosis Date  . Arthritis   . Hypertension     Past Surgical History:  Procedure Laterality Date  . CHOLECYSTECTOMY    . COLONOSCOPY  2007   Dr Candace Cruise  . GALLBLADDER SURGERY    . KNEE ARTHROPLASTY Left 08/27/2015   Procedure: COMPUTER ASSISTED TOTAL KNEE ARTHROPLASTY;  Surgeon: Dereck Leep, MD;  Location: [facility] ORS;  Service: Orthopedics;  Laterality: Left;  . meniscal surgery Left     Family History  Problem Relation Age of Onset  . Hypertension Mother   . Hypertension Father   . Cancer Sister     Social History   Social History  . Marital status: Single    Spouse name: N/A  . Number of children: N/A  . Years of education: N/A   Occupational History  . Not on file.   Social History Main Topics  . Smoking status: Never Smoker  . Smokeless tobacco: Never Used  .  Alcohol use No  . Drug use: No  . Sexual activity: Yes   Other Topics Concern  . Not on file   Social History Narrative  . No narrative on file    No Known Allergies   Review of Systems  Constitutional: Negative for chills, fever, malaise/fatigue and weight loss.  HENT: Negative for ear discharge, ear pain and sore throat.   Eyes: Negative for blurred vision.  Respiratory: Negative for cough, sputum production, shortness of breath and wheezing.   Cardiovascular: Negative for chest pain, palpitations, orthopnea, leg swelling and PND.  Gastrointestinal: Negative for abdominal pain, blood in stool, constipation, diarrhea, heartburn, melena and nausea.  Genitourinary: Negative for dysuria, frequency, hematuria and urgency.  Musculoskeletal: Negative for back pain, joint pain, myalgias and neck pain.  Skin: Negative for rash.  Neurological: Negative for dizziness, tingling, sensory change, focal weakness and headaches.  Endo/Heme/Allergies: Negative for environmental allergies and polydipsia. Does not bruise/bleed easily.  Psychiatric/Behavioral: Negative for depression and suicidal ideas. The patient is not nervous/anxious and does not have insomnia.      Objective  Vitals:   07/02/16 0902  BP: 130/64  Pulse: 68  Weight: 229 lb (103.9 kg)  Height: 5\' 2"  (1.575 m)    Physical Exam  Constitutional: She is well-developed, well-nourished, and in no distress. No distress.  HENT:  Head: Normocephalic and atraumatic.  Right Ear: External ear normal.  Left Ear: External ear normal.  Nose: Nose normal.  Mouth/Throat: Oropharynx is  clear and moist.  Eyes: Conjunctivae and EOM are normal. Pupils are equal, round, and reactive to light. Right eye exhibits no discharge. Left eye exhibits no discharge.  Neck: Normal range of motion. Neck supple. No JVD present. No thyromegaly present.  Cardiovascular: Normal rate, regular rhythm, normal heart sounds and intact distal pulses.  Exam  reveals no gallop and no friction rub.   No murmur heard. Pulmonary/Chest: Effort normal and breath sounds normal. She has no wheezes. She has no rales.  Abdominal: Soft. Bowel sounds are normal. She exhibits no mass. There is no tenderness. There is no guarding.  Musculoskeletal: Normal range of motion. She exhibits no edema.  Lymphadenopathy:    She has no cervical adenopathy.  Neurological: She is alert. She has normal reflexes.  Skin: Skin is warm and dry. She is not diaphoretic.  Psychiatric: Mood and affect normal.  Nursing note and vitals reviewed.     Assessment & Plan  Problem List Items Addressed This Visit      Cardiovascular and Mediastinum   Essential (primary) hypertension - Primary   Relevant Medications   amLODipine-benazepril (LOTREL) 5-10 MG capsule     Other   Combined fat and carbohydrate induced hyperlipemia   Relevant Medications   amLODipine-benazepril (LOTREL) 5-10 MG capsule   Other Relevant Orders   Lipid Profile    Other Visit Diagnoses    Immunization due       Relevant Orders   Tdap vaccine greater than or equal to 7yo IM (Completed)        Dr. Mac[facility]  07/02/16

## 2016-07-03 LAB — LIPID PANEL
CHOLESTEROL TOTAL: 207 mg/dL — AB (ref 100–199)
Chol/HDL Ratio: 3.8 ratio units (ref 0.0–4.4)
HDL: 55 mg/dL (ref >39)
LDL Calculated: 136 mg/dL — ABNORMAL HIGH (ref 0–99)
Triglycerides: 78 mg/dL (ref 0–149)
VLDL CHOLESTEROL CAL: 16 mg/dL (ref 5–40)

## 2016-07-17 DIAGNOSIS — Z01419 Encounter for gynecological examination (general) (routine) without abnormal findings: Secondary | ICD-10-CM | POA: Diagnosis not present

## 2016-07-17 DIAGNOSIS — Z8041 Family history of malignant neoplasm of ovary: Secondary | ICD-10-CM | POA: Diagnosis not present

## 2016-07-17 DIAGNOSIS — Z1239 Encounter for other screening for malignant neoplasm of breast: Secondary | ICD-10-CM | POA: Diagnosis not present

## 2016-08-20 ENCOUNTER — Other Ambulatory Visit: Payer: Self-pay | Admitting: Obstetrics and Gynecology

## 2016-08-20 DIAGNOSIS — Z1231 Encounter for screening mammogram for malignant neoplasm of breast: Secondary | ICD-10-CM

## 2016-08-26 DIAGNOSIS — Z96652 Presence of left artificial knee joint: Secondary | ICD-10-CM | POA: Diagnosis not present

## 2016-09-22 DIAGNOSIS — J069 Acute upper respiratory infection, unspecified: Secondary | ICD-10-CM | POA: Diagnosis not present

## 2016-10-21 ENCOUNTER — Ambulatory Visit
Admission: RE | Admit: 2016-10-21 | Discharge: 2016-10-21 | Disposition: A | Discharge status: Home / Self Care | Payer: BLUE CROSS/BLUE SHIELD | Source: Ambulatory Visit | Attending: Obstetrics and Gynecology | Admitting: Obstetrics and Gynecology

## 2016-10-21 ENCOUNTER — Encounter: Payer: Self-pay | Admitting: Obstetrics and Gynecology

## 2016-10-21 DIAGNOSIS — Z1231 Encounter for screening mammogram for malignant neoplasm of breast: Secondary | ICD-10-CM

## 2016-12-01 IMAGING — DX DG KNEE 1-2V PORT*L*
2 series · 2 of 2 positions shown · non-contrast
Comparison: MRI left knee 12/13/2013.

CLINICAL DATA: Status post left knee replacement today. Initial
encounter.

EXAM:
PORTABLE LEFT KNEE - 1-2 VIEW

[knee ap]
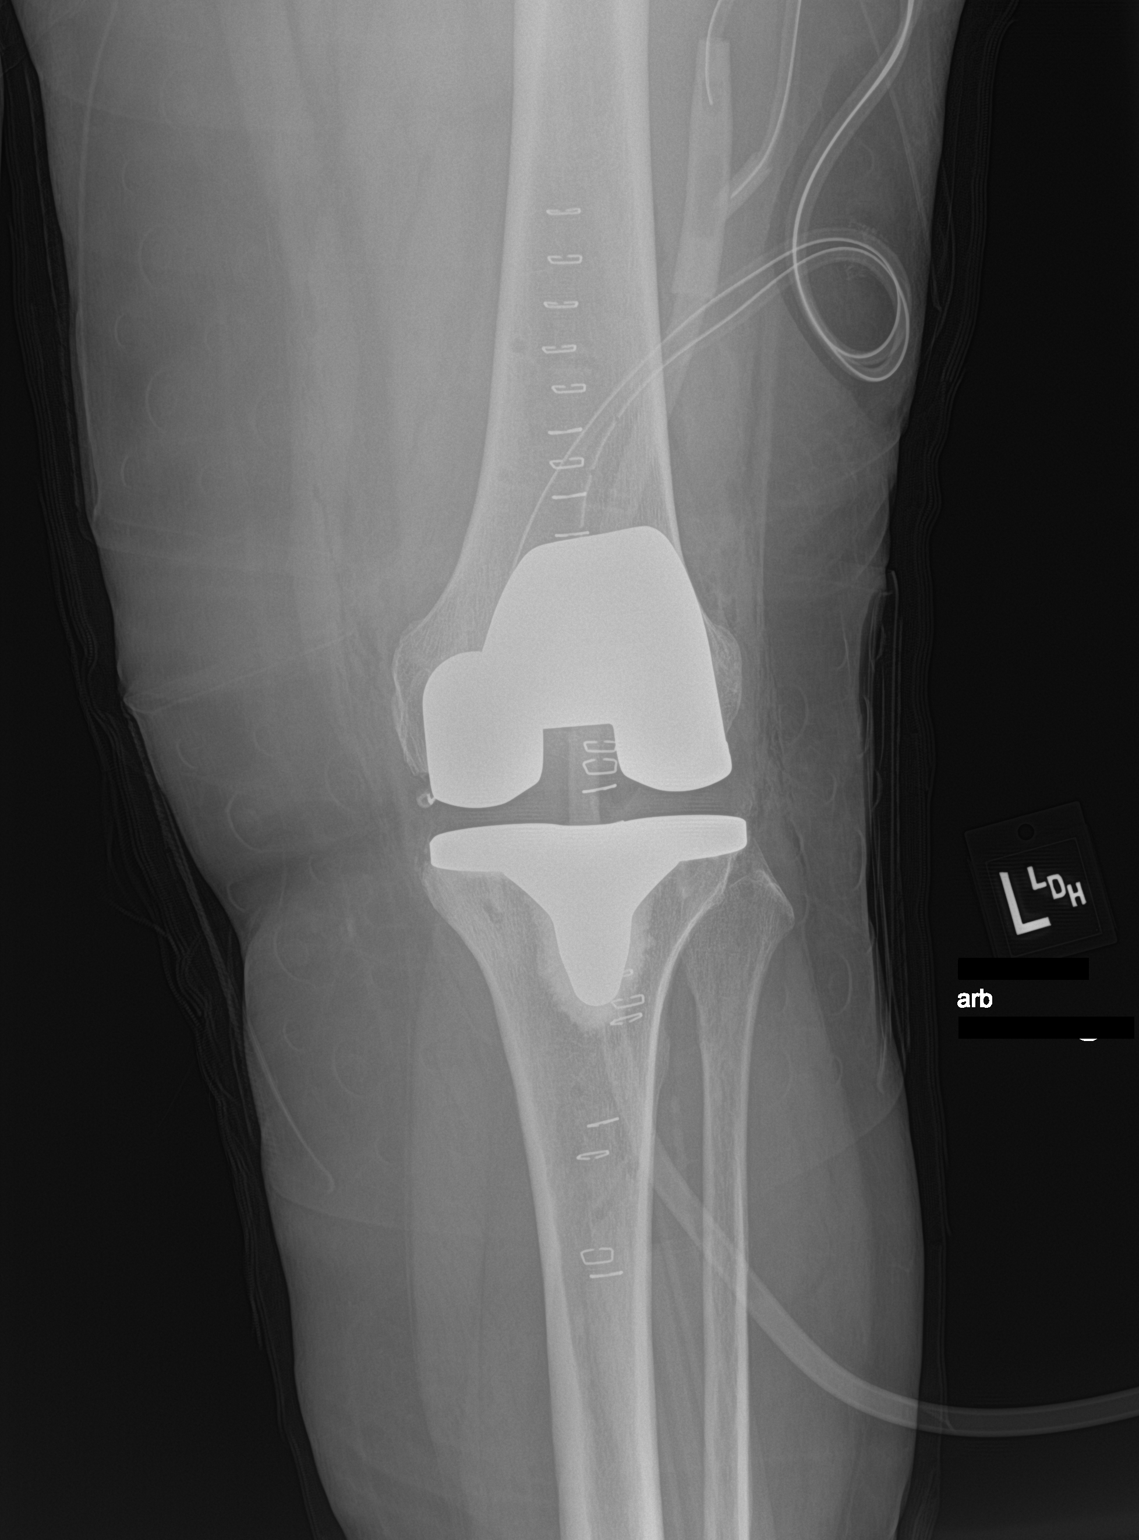

[knee lat]
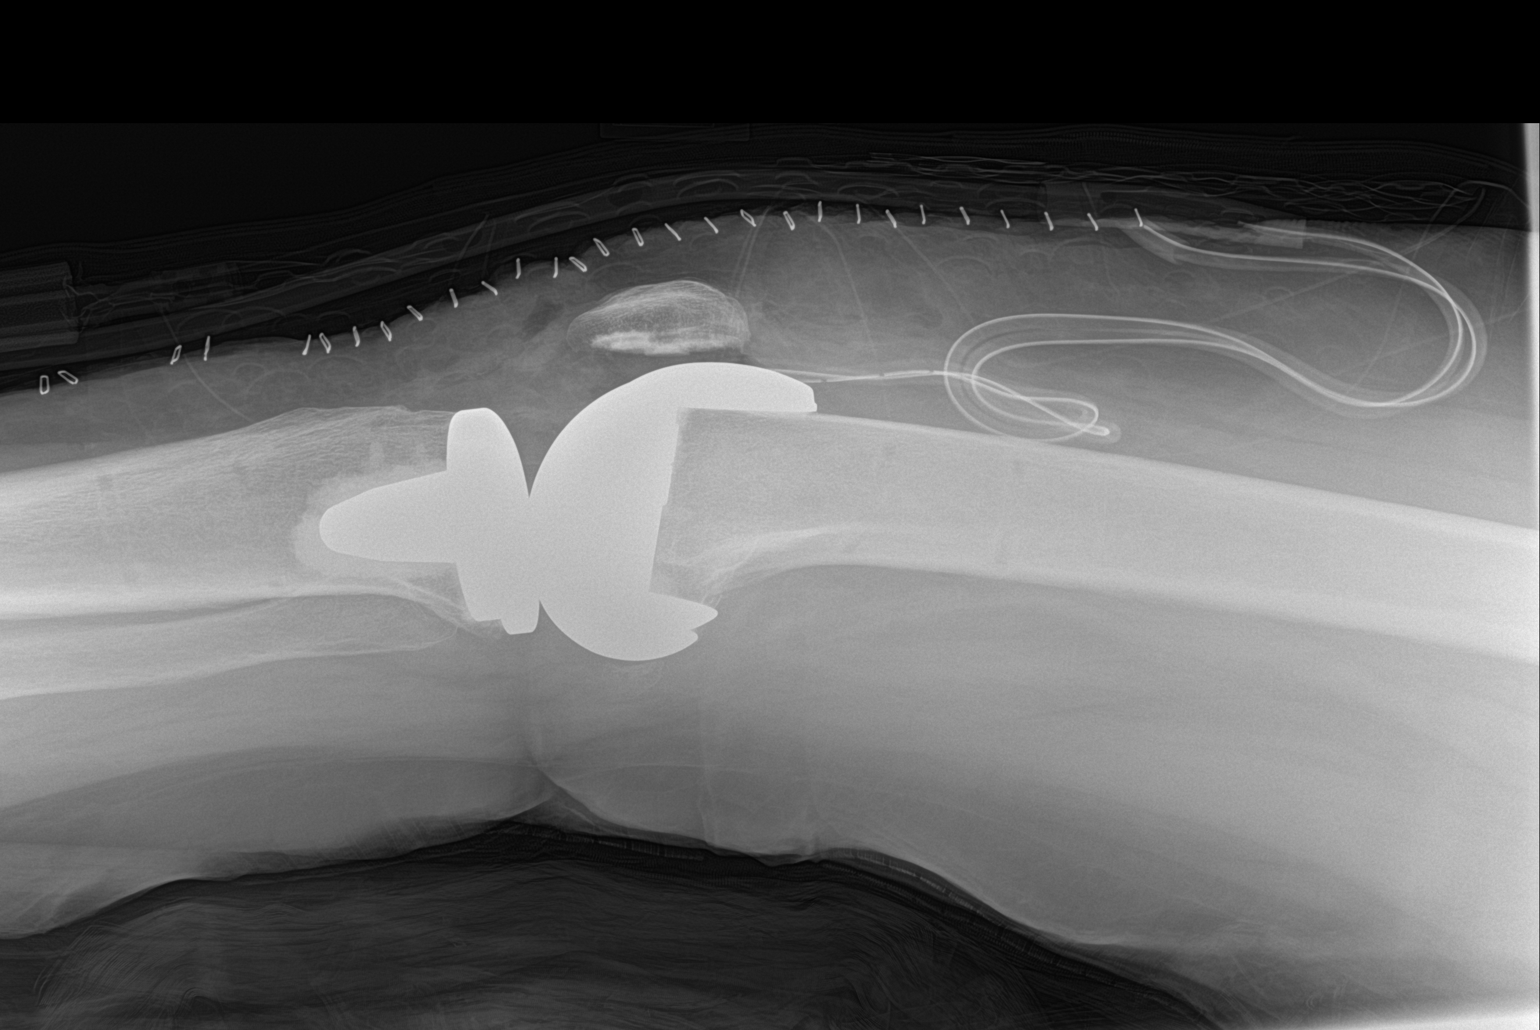

[2 of 2 positions shown; findings below may reference images not displayed]

FINDINGS: New left total knee arthroplasty is in place. Surgical drain and
staples are noted. The device is located and there is no fracture.
IMPRESSION: New left total knee arthroplasty without complicating feature.

## 2016-12-22 DIAGNOSIS — J069 Acute upper respiratory infection, unspecified: Secondary | ICD-10-CM | POA: Diagnosis not present

## 2016-12-22 DIAGNOSIS — L255 Unspecified contact dermatitis due to plants, except food: Secondary | ICD-10-CM | POA: Diagnosis not present

## 2016-12-29 ENCOUNTER — Encounter: Payer: Self-pay | Admitting: Family Medicine

## 2016-12-29 ENCOUNTER — Ambulatory Visit (INDEPENDENT_AMBULATORY_CARE_PROVIDER_SITE_OTHER): Payer: BLUE CROSS/BLUE SHIELD | Admitting: Family Medicine

## 2016-12-29 VITALS — BP 122/80 | HR 64 | Ht 62.0 in | Wt 236.0 lb

## 2016-12-29 DIAGNOSIS — I10 Essential (primary) hypertension: Secondary | ICD-10-CM

## 2016-12-29 DIAGNOSIS — J40 Bronchitis, not specified as acute or chronic: Secondary | ICD-10-CM | POA: Diagnosis not present

## 2016-12-29 MED ORDER — AMLODIPINE BESY-BENAZEPRIL HCL 5-10 MG PO CAPS
1.0000 | ORAL_CAPSULE | Freq: Every day | ORAL | 1 refills | Status: DC
Start: 2016-12-29 — End: 2017-07-01

## 2016-12-29 MED ORDER — ALBUTEROL SULFATE HFA 108 (90 BASE) MCG/ACT IN AERS: 2.0000 | INHALATION_SPRAY | Freq: Four times a day (QID) | RESPIRATORY_TRACT | 0 refills | Status: DC | PRN

## 2016-12-29 MED ORDER — PREDNISONE 10 MG PO TABS: 10.0000 mg | ORAL_TABLET | Freq: Every day | ORAL | 0 refills | Status: DC

## 2016-12-29 MED ORDER — DOXYCYCLINE HYCLATE 100 MG PO TABS: 100.0000 mg | ORAL_TABLET | Freq: Two times a day (BID) | ORAL | 0 refills | Status: DC

## 2016-12-29 MED ORDER — GUAIFENESIN-CODEINE 100-10 MG/5ML PO SYRP: 5.0000 mL | ORAL_SOLUTION | Freq: Three times a day (TID) | ORAL | 0 refills | Status: DC | PRN

## 2016-12-29 NOTE — Progress Notes (Signed)
Name: Shannon Blake   MRN: [id number]    DOB: 10-22-1955   Date:12/29/2016       Progress Note  Subjective  Chief Complaint  Chief Complaint  Patient presents with  . Hypertension  . Follow-up    urgent care- antibiotic is working but needs something stronger for cough- is taking Bromfed    Hypertension  This is a chronic problem. The current episode started more than 1 year ago. The problem has been waxing and waning since onset. The problem is controlled. Pertinent negatives include no anxiety, blurred vision, chest pain, headaches, malaise/fatigue, neck pain, orthopnea, palpitations, peripheral edema, PND, shortness of breath or sweats. There are no associated agents to hypertension. There are no known risk factors for coronary artery disease. Past treatments include calcium channel blockers. The current treatment provides moderate improvement. There are no compliance problems.  There is no history of angina, kidney disease, CAD/MI, CVA, heart failure, left ventricular hypertrophy, PVD or retinopathy. There is no history of chronic renal disease, a hypertension causing med or renovascular disease.    No problem-specific Assessment & Plan notes found for this encounter.   Past Medical History:  Diagnosis Date  . Arthritis   . Hypertension     Past Surgical History:  Procedure Laterality Date  . CHOLECYSTECTOMY    . COLONOSCOPY  2007   Dr Candace Cruise  . GALLBLADDER SURGERY    . KNEE ARTHROPLASTY Left 08/27/2015   Procedure: COMPUTER ASSISTED TOTAL KNEE ARTHROPLASTY;  Surgeon: Dereck Leep, MD;  Location: [facility] ORS;  Service: Orthopedics;  Laterality: Left;  . meniscal surgery Left     Family History  Problem Relation Age of Onset  . Hypertension Mother   . Hypertension Father   . Cancer Sister   . Breast cancer Neg Hx     Social History   Social History  . Marital status: Single    Spouse name: N/A  . Number of children: N/A  . Years of education: N/A   Occupational History   . Not on file.   Social History Main Topics  . Smoking status: Never Smoker  . Smokeless tobacco: Never Used  . Alcohol use No  . Drug use: No  . Sexual activity: Yes   Other Topics Concern  . Not on file   Social History Narrative  . No narrative on file    No Known Allergies  Outpatient Medications Prior to Visit  Medication Sig Dispense Refill  . aspirin 81 MG chewable tablet Chew 81 mg by mouth daily.     . Calcium Carb-Cholecalciferol (CALCIUM 600 + D PO) Take 1 tablet by mouth daily.    . Multiple Vitamins-Minerals (CENTRUM SILVER ULTRA WOMENS) TABS Take 1 tablet by mouth daily.    Marland Kitchen amLODipine-benazepril (LOTREL) 5-10 MG capsule Take 1 capsule by mouth daily. 90 capsule 2   No facility-administered medications prior to visit.     Review of Systems  Constitutional: Negative for chills, fever, malaise/fatigue and weight loss.  HENT: Negative for ear discharge, ear pain and sore throat.   Eyes: Negative for blurred vision.  Respiratory: Negative for cough, sputum production, shortness of breath and wheezing.   Cardiovascular: Negative for chest pain, palpitations, orthopnea, leg swelling and PND.  Gastrointestinal: Negative for abdominal pain, blood in stool, constipation, diarrhea, heartburn, melena and nausea.  Genitourinary: Negative for dysuria, frequency, hematuria and urgency.  Musculoskeletal: Negative for back pain, joint pain, myalgias and neck pain.  Skin: Negative for rash.  Neurological:  Negative for dizziness, tingling, sensory change, focal weakness and headaches.  Endo/Heme/Allergies: Negative for environmental allergies and polydipsia. Does not bruise/bleed easily.  Psychiatric/Behavioral: Negative for depression and suicidal ideas. The patient is not nervous/anxious and does not have insomnia.      Objective  Vitals:   12/29/16 1019  BP: 122/80  Pulse: 64  Weight: 236 lb (107 kg)  Height: 5\' 2"  (1.575 m)    Physical Exam  Constitutional:  She is well-developed, well-nourished, and in no distress. No distress.  HENT:  Head: Normocephalic and atraumatic.  Right Ear: External ear normal.  Left Ear: External ear normal.  Nose: Nose normal.  Mouth/Throat: Oropharynx is clear and moist.  Eyes: Pupils are equal, round, and reactive to light. Conjunctivae and EOM are normal. Right eye exhibits no discharge. Left eye exhibits no discharge.  Neck: Normal range of motion. Neck supple. No JVD present. No thyromegaly present.  Cardiovascular: Normal rate, regular rhythm, normal heart sounds and intact distal pulses.  Exam reveals no gallop and no friction rub.   No murmur heard. Pulmonary/Chest: Effort normal and breath sounds normal.  Abdominal: Soft. Bowel sounds are normal. She exhibits no mass. There is no tenderness. There is no guarding.  Musculoskeletal: Normal range of motion. She exhibits no edema.  Lymphadenopathy:    She has no cervical adenopathy.  Neurological: She is alert. She has normal reflexes.  Skin: Skin is warm and dry. She is not diaphoretic.  Psychiatric: Mood and affect normal.  Nursing note and vitals reviewed.     Assessment & Plan  Problem List Items Addressed This Visit      Cardiovascular and Mediastinum   Essential (primary) hypertension   Relevant Medications   amLODipine-benazepril (LOTREL) 5-10 MG capsule   Other Relevant Orders   Renal function panel   Lipid Profile    Other Visit Diagnoses    Bronchitis    -  Primary   Relevant Medications   predniSONE (DELTASONE) 10 MG tablet   guaiFENesin-codeine (ROBITUSSIN AC) 100-10 MG/5ML syrup   doxycycline (VIBRA-TABS) 100 MG tablet   albuterol (PROVENTIL HFA;VENTOLIN HFA) 108 (90 Base) MCG/ACT inhaler      Meds ordered this encounter  Medications  . predniSONE (DELTASONE) 10 MG tablet    Sig: Take 1 tablet (10 mg total) by mouth daily with breakfast.    Dispense:  14 tablet    Refill:  0  . guaiFENesin-codeine (ROBITUSSIN AC) 100-10  MG/5ML syrup    Sig: Take 5 mLs by mouth 3 (three) times daily as needed for cough.    Dispense:  100 mL    Refill:  0  . doxycycline (VIBRA-TABS) 100 MG tablet    Sig: Take 1 tablet (100 mg total) by mouth 2 (two) times daily.    Dispense:  20 tablet    Refill:  0  . amLODipine-benazepril (LOTREL) 5-10 MG capsule    Sig: Take 1 capsule by mouth daily.    Dispense:  90 capsule    Refill:  1  . albuterol (PROVENTIL HFA;VENTOLIN HFA) 108 (90 Base) MCG/ACT inhaler    Sig: Inhale 2 puffs into the lungs every 6 (six) hours as needed for wheezing or shortness of breath.    Dispense:  1 Inhaler    Refill:  0      Dr. Otilio Miu [facility]  12/29/16

## 2016-12-30 LAB — RENAL FUNCTION PANEL
Albumin: 4.7 g/dL (ref 3.6–4.8)
BUN / CREAT RATIO: 19 (ref 12–28)
BUN: 15 mg/dL (ref 8–27)
CO2: 21 mmol/L (ref 20–29)
CREATININE: 0.77 mg/dL (ref 0.57–1.00)
Calcium: 9.7 mg/dL (ref 8.7–10.3)
Chloride: 103 mmol/L (ref 96–106)
GFR calc Af Amer: 96 mL/min/{1.73_m2} (ref >59)
GFR, EST NON AFRICAN AMERICAN: 84 mL/min/{1.73_m2} (ref >59)
Glucose: 81 mg/dL (ref 65–99)
Phosphorus: 3.3 mg/dL (ref 2.5–4.5)
Potassium: 4.1 mmol/L (ref 3.5–5.2)
SODIUM: 143 mmol/L (ref 134–144)

## 2016-12-30 LAB — LIPID PANEL
CHOL/HDL RATIO: 3.6 ratio (ref 0.0–4.4)
Cholesterol, Total: 192 mg/dL (ref 100–199)
HDL: 53 mg/dL (ref >39)
LDL CALC: 124 mg/dL — AB (ref 0–99)
TRIGLYCERIDES: 76 mg/dL (ref 0–149)
VLDL CHOLESTEROL CAL: 15 mg/dL (ref 5–40)

## 2016-12-31 ENCOUNTER — Ambulatory Visit: Payer: BLUE CROSS/BLUE SHIELD | Admitting: Family Medicine

## 2017-01-05 ENCOUNTER — Emergency Department
Admission: EM | Admit: 2017-01-05 | Discharge: 2017-01-05 | Disposition: A | Discharge status: Home / Self Care | Payer: BLUE CROSS/BLUE SHIELD | Attending: Emergency Medicine | Admitting: Emergency Medicine

## 2017-01-05 ENCOUNTER — Encounter: Payer: Self-pay | Admitting: Emergency Medicine

## 2017-01-05 DIAGNOSIS — R1110 Vomiting, unspecified: Secondary | ICD-10-CM | POA: Diagnosis not present

## 2017-01-05 DIAGNOSIS — K529 Noninfective gastroenteritis and colitis, unspecified: Secondary | ICD-10-CM | POA: Diagnosis not present

## 2017-01-05 DIAGNOSIS — I10 Essential (primary) hypertension: Secondary | ICD-10-CM | POA: Insufficient documentation

## 2017-01-05 DIAGNOSIS — R109 Unspecified abdominal pain: Secondary | ICD-10-CM | POA: Diagnosis present

## 2017-01-05 LAB — COMPREHENSIVE METABOLIC PANEL
ALBUMIN: 4.3 g/dL (ref 3.5–5.0)
ALT: 23 U/L (ref 14–54)
AST: 23 U/L (ref 15–41)
Alkaline Phosphatase: 79 U/L (ref 38–126)
Anion gap: 9 (ref 5–15)
BUN: 13 mg/dL (ref 6–20)
CHLORIDE: 106 mmol/L (ref 101–111)
CO2: 26 mmol/L (ref 22–32)
Calcium: 9.5 mg/dL (ref 8.9–10.3)
Creatinine, Ser: 0.88 mg/dL (ref 0.44–1.00)
GFR calc Af Amer: >60 mL/min (ref >60)
GFR calc non Af Amer: >60 mL/min (ref >60)
Glucose, Bld: 95 mg/dL (ref 65–99)
POTASSIUM: 4 mmol/L (ref 3.5–5.1)
Sodium: 141 mmol/L (ref 135–145)
Total Bilirubin: 0.5 mg/dL (ref 0.3–1.2)
Total Protein: 7.4 g/dL (ref 6.5–8.1)

## 2017-01-05 LAB — URINALYSIS, COMPLETE (UACMP) WITH MICROSCOPIC
BILIRUBIN URINE: NEGATIVE
Glucose, UA: NEGATIVE mg/dL
Hgb urine dipstick: NEGATIVE
Ketones, ur: 5 mg/dL — AB
LEUKOCYTES UA: NEGATIVE
Nitrite: NEGATIVE
Protein, ur: NEGATIVE mg/dL
SPECIFIC GRAVITY, URINE: 1.021 (ref 1.005–1.030)
pH: 5 (ref 5.0–8.0)

## 2017-01-05 LAB — CBC
HEMATOCRIT: 42.1 % (ref 35.0–47.0)
Hemoglobin: 13.9 g/dL (ref 12.0–16.0)
MCH: 27.1 pg (ref 26.0–34.0)
MCHC: 33.1 g/dL (ref 32.0–36.0)
MCV: 81.8 fL (ref 80.0–100.0)
Platelets: 431 10*3/uL (ref 150–440)
RBC: 5.14 MIL/uL (ref 3.80–5.20)
RDW: 14 % (ref 11.5–14.5)
WBC: 8.4 10*3/uL (ref 3.6–11.0)

## 2017-01-05 LAB — LIPASE, BLOOD: LIPASE: 22 U/L (ref 11–51)

## 2017-01-05 MED ORDER — ONDANSETRON 4 MG PO TBDP: 4.0000 mg | ORAL_TABLET | Freq: Three times a day (TID) | ORAL | 0 refills | Status: DC | PRN

## 2017-01-05 MED ORDER — ONDANSETRON 4 MG PO TBDP
4.0000 mg | ORAL_TABLET | Freq: Once | ORAL | Status: AC
  Administered 2017-01-05: 4 mg via ORAL
  Filled 2017-01-05: qty 1

## 2017-01-05 NOTE — ED Provider Notes (Signed)
[facility] Emergency Department Provider Note       Time seen: ----------------------------------------- 4:46 PM on 01/05/2017 -----------------------------------------     I have reviewed the triage vital signs and the nursing notes.   HISTORY   Chief Complaint Abdominal Pain and Emesis    HPI Shannon GUINTHER is a 61 y.o. female who presents to the ED for nausea, vomiting and diarrhea for the past 5 days. She has had some nonspecific abdominal pain. She's not had any urinary symptoms. Patient finished several courses of antibiotics for upper respiratory infection and bronchitis symptoms prescribed by her doctor and an urgent care. She is no longer taking antibiotics at this time.   Past Medical History:  Diagnosis Date  . Arthritis   . Hypertension     Patient Active Problem List   Diagnosis Date Noted  . S/P total knee arthroplasty 08/27/2015  . Calculus of kidney 10/17/2014  . Alimentary obesity 10/17/2014  . Cough 10/17/2014  . Essential (primary) hypertension 10/17/2014  . Combined fat and carbohydrate induced hyperlipemia 10/17/2014  . Derangement of knee 12/01/2013  . Arthritis of knee, degenerative 12/01/2013    Past Surgical History:  Procedure Laterality Date  . CHOLECYSTECTOMY    . COLONOSCOPY  2007   Dr Candace Cruise  . GALLBLADDER SURGERY    . KNEE ARTHROPLASTY Left 08/27/2015   Procedure: COMPUTER ASSISTED TOTAL KNEE ARTHROPLASTY;  Surgeon: Dereck Leep, MD;  Location: [facility] ORS;  Service: Orthopedics;  Laterality: Left;  . meniscal surgery Left     Allergies Patient has no known allergies.  Social History Social History  Substance Use Topics  . Smoking status: Never Smoker  . Smokeless tobacco: Never Used  . Alcohol use No    Review of Systems Constitutional: Negative for fever. Eyes: Negative for vision changes ENT:  Negative for congestion, sore throat Cardiovascular: Negative for chest pain. Respiratory: Negative for  shortness of breath. Gastrointestinal: Positive for nausea, vomiting and diarrhea Genitourinary: Negative for dysuria. Musculoskeletal: Negative for back pain. Skin: Negative for rash. Neurological: Negative for headaches, focal weakness or numbness.  All systems negative/normal/unremarkable except as stated in the HPI  ____________________________________________   PHYSICAL EXAM:  VITAL SIGNS: ED Triage Vitals  Enc Vitals Group     BP 01/05/17 1443 119/68     Pulse Rate 01/05/17 1443 78     Resp 01/05/17 1443 18     Temp 01/05/17 1443 98.3 F (36.8 C)     Temp Source 01/05/17 1443 Oral     SpO2 01/05/17 1443 99 %     Weight 01/05/17 1446 230 lb (104.3 kg)     Height 01/05/17 1446 5\' 2"  (1.575 m)     Head Circumference --      Peak Flow --      Pain Score 01/05/17 1443 5     Pain Loc --      Pain Edu? --      Excl. in [place]? --     Constitutional: Alert and oriented. Well appearing and in no distress. Eyes: Conjunctivae are normal. Normal extraocular movements. ENT   Head: Normocephalic and atraumatic.   Nose: No congestion/rhinnorhea.   Mouth/Throat: Mucous membranes are moist.   Neck: No stridor. Cardiovascular: Normal rate, regular rhythm. No murmurs, rubs, or gallops. Respiratory: Normal respiratory effort without tachypnea nor retractions. Breath sounds are clear and equal bilaterally. No wheezes/rales/rhonchi. Gastrointestinal: Soft and nontender. Normal bowel sounds Musculoskeletal: Nontender with normal range of motion in extremities. No lower  extremity tenderness nor edema. Neurologic:  Normal speech and language. No gross focal neurologic deficits are appreciated.  Skin:  Skin is warm, dry and intact. No rash noted. Psychiatric: Mood and affect are normal. Speech and behavior are normal.  ___________________________________________  ED COURSE:  Pertinent labs & imaging results that were available during my care of the patient were reviewed by me  and considered in my medical decision making (see chart for details). Patient presents for gastroenteritis, we will assess with labs as indicated.   Procedures ____________________________________________   LABS (pertinent positives/negatives)  Labs Reviewed  URINALYSIS, COMPLETE (UACMP) WITH MICROSCOPIC - Abnormal; Notable for the following:       Result Value   Color, Urine AMBER (*)    APPearance HAZY (*)    Ketones, ur 5 (*)    Bacteria, UA RARE (*)    Squamous Epithelial / LPF 0-5 (*)    All other components within normal limits  LIPASE, BLOOD  COMPREHENSIVE METABOLIC PANEL  CBC   ____________________________________________  FINAL ASSESSMENT AND PLAN  Gastroenteritis  Plan: Patient's labs were dictated above. Patient had presented for nausea, vomiting and diarrhea after recent upper respiratory infection and antibiotics. I have encouraged gentle diet, probiotics, Imodium and Zofran. She is encouraged to take extra Prilosec and have outpatient follow-up if not improving.   Earleen Newport, MD   Note: This note was generated in part or whole with voice recognition software. Voice recognition is usually quite accurate but there are transcription errors that can and very often do occur. I apologize for any typographical errors that were not detected and corrected.     Earleen Newport, MD 01/05/17 [phone]

## 2017-01-05 NOTE — ED Triage Notes (Signed)
Has been on 2 courses of antibiotics for URI recently. Augmentin and doxycycline.

## 2017-01-05 NOTE — ED Triage Notes (Signed)
Lower abd pain x 5 days with nausea, vomiting and diarrhea. No urinary frequency.

## 2017-01-06 ENCOUNTER — Ambulatory Visit (INDEPENDENT_AMBULATORY_CARE_PROVIDER_SITE_OTHER): Payer: BLUE CROSS/BLUE SHIELD | Admitting: Family Medicine

## 2017-01-06 ENCOUNTER — Encounter: Payer: Self-pay | Admitting: Family Medicine

## 2017-01-06 VITALS — BP 100/60 | HR 80 | Ht 62.0 in | Wt 229.0 lb

## 2017-01-06 DIAGNOSIS — K529 Noninfective gastroenteritis and colitis, unspecified: Secondary | ICD-10-CM | POA: Diagnosis not present

## 2017-01-06 DIAGNOSIS — R112 Nausea with vomiting, unspecified: Secondary | ICD-10-CM

## 2017-01-06 DIAGNOSIS — R197 Diarrhea, unspecified: Secondary | ICD-10-CM

## 2017-01-06 MED ORDER — PROMETHAZINE HCL 25 MG RE SUPP: 25.0000 mg | Freq: Four times a day (QID) | RECTAL | 0 refills | Status: DC | PRN

## 2017-01-06 NOTE — Progress Notes (Signed)
Name: Shannon Blake   MRN: [id number]    DOB: [birth year]   Date:01/06/2017       Progress Note  Subjective  Chief Complaint  Chief Complaint  Patient presents with  . Abdominal Pain    with diarrhea, nausea and vomitting    Abdominal Pain  This is a new problem. The current episode started in the past 7 days. The onset quality is gradual. The problem occurs intermittently. The problem has been waxing and waning. The pain is located in the RLQ, LLQ and suprapubic region. The pain is at a severity of 6/10. The pain is moderate. The quality of the pain is aching. The abdominal pain does not radiate. Associated symptoms include diarrhea, flatus, myalgias, nausea and vomiting. Pertinent negatives include no anorexia, arthralgias, belching, constipation, dysuria, fever, frequency, headaches, hematochezia, hematuria or melena. The pain is aggravated by eating and bowel movement. The pain is relieved by being still. She has tried antibiotics for the symptoms. Her past medical history is significant for gallstones. There is no history of abdominal surgery, colon cancer, Crohn's disease, pancreatitis or ulcerative colitis.    No problem-specific Assessment & Plan notes found for this encounter.   Past Medical History:  Diagnosis Date  . Arthritis   . Hypertension     Past Surgical History:  Procedure Laterality Date  . CHOLECYSTECTOMY    . COLONOSCOPY  2007   Dr Candace Cruise  . GALLBLADDER SURGERY    . KNEE ARTHROPLASTY Left 08/27/2015   Procedure: COMPUTER ASSISTED TOTAL KNEE ARTHROPLASTY;  Surgeon: Dereck Leep, MD;  Location: [facility] ORS;  Service: Orthopedics;  Laterality: Left;  . meniscal surgery Left     Family History  Problem Relation Age of Onset  . Hypertension Mother   . Hypertension Father   . Cancer Sister   . Breast cancer Neg Hx     Social History   Social History  . Marital status: Single    Spouse name: N/A  . Number of children: N/A  . Years of education: N/A    Occupational History  . Not on file.   Social History Main Topics  . Smoking status: Never Smoker  . Smokeless tobacco: Never Used  . Alcohol use No  . Drug use: No  . Sexual activity: Yes   Other Topics Concern  . Not on file   Social History Narrative  . No narrative on file    No Known Allergies  Outpatient Medications Prior to Visit  Medication Sig Dispense Refill  . albuterol (PROVENTIL HFA;VENTOLIN HFA) 108 (90 Base) MCG/ACT inhaler Inhale 2 puffs into the lungs every 6 (six) hours as needed for wheezing or shortness of breath. 1 Inhaler 0  . amLODipine-benazepril (LOTREL) 5-10 MG capsule Take 1 capsule by mouth daily. 90 capsule 1  . aspirin 81 MG chewable tablet Chew 81 mg by mouth daily.     . Calcium Carb-Cholecalciferol (CALCIUM 600 + D PO) Take 1 tablet by mouth daily.    . Multiple Vitamins-Minerals (CENTRUM SILVER ULTRA WOMENS) TABS Take 1 tablet by mouth daily.    . ondansetron (ZOFRAN ODT) 4 MG disintegrating tablet Take 1 tablet (4 mg total) by mouth every 8 (eight) hours as needed for nausea or vomiting. 20 tablet 0  . doxycycline (VIBRA-TABS) 100 MG tablet Take 1 tablet (100 mg total) by mouth 2 (two) times daily. (Patient not taking: Reported on 01/06/2017) 20 tablet 0  . guaiFENesin-codeine (ROBITUSSIN AC) 100-10 MG/5ML syrup Take 5 mLs by  mouth 3 (three) times daily as needed for cough. (Patient not taking: Reported on 01/06/2017) 100 mL 0  . predniSONE (DELTASONE) 10 MG tablet Take 1 tablet (10 mg total) by mouth daily with breakfast. (Patient not taking: Reported on 01/06/2017) 14 tablet 0  . brompheniramine-pseudoephedrine-DM 30-2-10 MG/5ML syrup Take by mouth.     No facility-administered medications prior to visit.     Review of Systems  Constitutional: Negative for fever.  Gastrointestinal: Positive for abdominal pain, diarrhea, flatus, nausea and vomiting. Negative for anorexia, constipation, hematochezia and melena.  Genitourinary: Negative for  dysuria, frequency and hematuria.  Musculoskeletal: Positive for myalgias. Negative for arthralgias.  Neurological: Negative for headaches.     Objective  Vitals:   01/06/17 1334  BP: 100/60  Pulse: 80  Weight: 229 lb (103.9 kg)  Height: 5\' 2"  (1.575 m)    Physical Exam  Constitutional: She is well-developed, well-nourished, and in no distress. No distress.  HENT:  Head: Normocephalic and atraumatic.  Right Ear: External ear normal.  Left Ear: External ear normal.  Nose: Nose normal.  Mouth/Throat: Oropharynx is clear and moist.  Eyes: Pupils are equal, round, and reactive to light. Conjunctivae and EOM are normal. Right eye exhibits no discharge. Left eye exhibits no discharge.  Neck: Normal range of motion. Neck supple. No JVD present. No thyromegaly present.  Cardiovascular: Normal rate, regular rhythm, normal heart sounds and intact distal pulses.  Exam reveals no gallop and no friction rub.   No murmur heard. Pulmonary/Chest: Effort normal and breath sounds normal. She has no wheezes. She has no rales.  Abdominal: Soft. Bowel sounds are normal. She exhibits no distension and no mass. There is tenderness. There is no rebound and no guarding.  Musculoskeletal: Normal range of motion. She exhibits no edema.  Lymphadenopathy:    She has no cervical adenopathy.  Neurological: She is alert. She has normal reflexes.  Skin: Skin is warm and dry. She is not diaphoretic.  Psychiatric: Mood and affect normal.  Nursing note and vitals reviewed.     Assessment & Plan  Problem List Items Addressed This Visit    None    Visit Diagnoses    Diarrhea, unspecified type    -  Primary   Relevant Orders   Stool C-Diff Toxin Assay   Gastroenteritis       Relevant Medications   promethazine (PHENERGAN) 25 MG suppository   Non-intractable vomiting with nausea, unspecified vomiting type       Relevant Medications   promethazine (PHENERGAN) 25 MG suppository      Meds ordered  this encounter  Medications  . promethazine (PHENERGAN) 25 MG suppository    Sig: Place 1 suppository (25 mg total) rectally every 6 (six) hours as needed for nausea or vomiting.    Dispense:  12 each    Refill:  0   I spent 30 minutes with this patient, More than 50% of that time was spent in face to face education, counseling and care coordination.   Dr. Mac[facility]  01/06/17

## 2017-01-06 NOTE — Patient Instructions (Signed)
Dehydration, Adult Dehydration is when there is not enough fluid or water in your body. This happens when you lose more fluids than you take in. Dehydration can range from mild to very bad. It should be treated right away to keep it from getting very bad. Symptoms of mild dehydration may include:  Thirst.  Dry lips.  Slightly dry mouth.  Dry, warm skin.  Dizziness. Symptoms of moderate dehydration may include:  Very dry mouth.  Muscle cramps.  Dark pee (urine). Pee may be the color of tea.  Your body making less pee.  Your eyes making fewer tears.  Heartbeat that is uneven or faster than normal (palpitations).  Headache.  Light-headedness, especially when you stand up from sitting.  Fainting (syncope). Symptoms of very bad dehydration may include:  Changes in skin, such as: ? Cold and clammy skin. ? Blotchy (mottled) or pale skin. ? Skin that does not quickly return to normal after being lightly pinched and let go (poor skin turgor).  Changes in body fluids, such as: ? Feeling very thirsty. ? Your eyes making fewer tears. ? Not sweating when body temperature is high, such as in hot weather. ? Your body making very little pee.  Changes in vital signs, such as: ? Weak pulse. ? Pulse that is more than 100 beats a minute when you are sitting still. ? Fast breathing. ? Low blood pressure.  Other changes, such as: ? Sunken eyes. ? Cold hands and feet. ? Confusion. ? Lack of energy (lethargy). ? Trouble waking up from sleep. ? Short-term weight loss. ? Unconsciousness. Follow these instructions at home:  If told by your doctor, drink an ORS: ? Make an ORS by using instructions on the package. ? Start by drinking small amounts, about  cup (120 mL) every 5-10 minutes. ? Slowly drink more until you have had the amount that your doctor said to have.  Drink enough clear fluid to keep your pee clear or pale yellow. If you were told to drink an ORS, finish the ORS  first, then start slowly drinking clear fluids. Drink fluids such as: ? Water. Do not drink only water by itself. Doing that can make the salt (sodium) level in your body get too low (hyponatremia). ? Ice chips. ? Fruit juice that you have added water to (diluted). ? Low-calorie sports drinks.  Avoid: ? Alcohol. ? Drinks that have a lot of sugar. These include high-calorie sports drinks, fruit juice that does not have water added, and soda. ? Caffeine. ? Foods that are greasy or have a lot of fat or sugar.  Take over-the-counter and prescription medicines only as told by your doctor.  Do not take salt tablets. Doing that can make the salt level in your body get too high (hypernatremia).  Eat foods that have minerals (electrolytes). Examples include bananas, oranges, potatoes, tomatoes, and spinach.  Keep all follow-up visits as told by your doctor. This is important. Contact a doctor if:  You have belly (abdominal) pain that: ? Gets worse. ? Stays in one area (localizes).  You have a rash.  You have a stiff neck.  You get angry or annoyed more easily than normal (irritability).  You are more sleepy than normal.  You have a harder time waking up than normal.  You feel: ? Weak. ? Dizzy. ? Very thirsty.  You have peed (urinated) only a small amount of very dark pee during 6-8 hours. Get help right away if:  You have symptoms of   very bad dehydration.  You cannot drink fluids without throwing up (vomiting).  Your symptoms get worse with treatment.  You have a fever.  You have a very bad headache.  You are throwing up or having watery poop (diarrhea) and it: ? Gets worse. ? Does not go away.  You have blood or something green (bile) in your throw-up.  You have blood in your poop (stool). This may cause poop to look black and tarry.  You have not peed in 6-8 hours.  You pass out (faint).  Your heart rate when you are sitting still is more than 100 beats a  minute.  You have trouble breathing. This information is not intended to replace advice given to you by your health care provider. Make sure you discuss any questions you have with your health care provider. Document Released: 03/22/2009 Document Revised: 12/14/2015 Document Reviewed: 07/20/2015 Elsevier Interactive Patient Education  2018 [employer] Choices to Help Relieve Diarrhea, Adult When you have diarrhea, the foods you eat and your eating habits are very important. Choosing the right foods and drinks can help:  Relieve diarrhea.  Replace lost fluids and nutrients.  Prevent dehydration.  What general guidelines should I follow? Relieving diarrhea  Choose foods with less than 2 g or .07 oz. of fiber per serving.  Limit fats to less than 8 tsp (38 g or 1.34 oz.) a day.  Avoid the following: ? Foods and beverages sweetened with high-fructose corn syrup, honey, or sugar alcohols such as xylitol, sorbitol, and mannitol. ? Foods that contain a lot of fat or sugar. ? Fried, greasy, or spicy foods. ? High-fiber grains, breads, and cereals. ? Raw fruits and vegetables.  Eat foods that are rich in probiotics. These foods include dairy products such as yogurt and fermented milk products. They help increase healthy bacteria in the stomach and intestines (gastrointestinal tract, or GI tract).  If you have lactose intolerance, avoid dairy products. These may make your diarrhea worse.  Take medicine to help stop diarrhea (antidiarrheal medicine) only as told by your health care provider. Replacing nutrients  Eat small meals or snacks every 3-4 hours.  Eat bland foods, such as white rice, toast, or baked potato, until your diarrhea starts to get better. Gradually reintroduce nutrient-rich foods as tolerated or as told by your health care provider. This includes: ? Well-cooked protein foods. ? Peeled, seeded, and soft-cooked fruits and vegetables. ? Low-fat dairy  products.  Take vitamin and mineral supplements as told by your health care provider. Preventing dehydration   Start by sipping water or a special solution to prevent dehydration (oral rehydration solution, ORS). Urine that is clear or pale yellow means that you are getting enough fluid.  Try to drink at least 8-10 cups of fluid each day to help replace lost fluids.  You may add other liquids in addition to water, such as clear juice or decaffeinated sports drinks, as tolerated or as told by your health care provider.  Avoid drinks with caffeine, such as coffee, tea, or soft drinks.  Avoid alcohol. What foods are recommended? The items listed may not be a complete list. Talk with your health care provider about what dietary choices are best for you. Grains White rice. White, [place], or pita breads (fresh or toasted), including plain rolls, buns, or bagels. White pasta. Saltine, soda, or graham crackers. Pretzels. Low-fiber cereal. Cooked cereals made with water (such as cornmeal, farina, or cream cereals). Plain muffins. Matzo. Melba toast. Zwieback. Vegetables  Potatoes (without the skin). Most well-cooked and canned vegetables without skins or seeds. Tender lettuce. Fruits Apple sauce. Fruits canned in juice. Cooked apricots, cherries, grapefruit, peaches, pears, or plums. Fresh bananas and cantaloupe. Meats and other protein foods Baked or boiled chicken. Eggs. Tofu. Fish. Seafood. Smooth nut butters. Ground or well-cooked tender beef, ham, veal, lamb, pork, or poultry. Dairy Plain yogurt, kefir, and unsweetened liquid yogurt. Lactose-free milk, buttermilk, skim milk, or soy milk. Low-fat or nonfat hard cheese. Beverages Water. Low-calorie sports drinks. Fruit juices without pulp. Strained tomato and vegetable juices. Decaffeinated teas. Sugar-free beverages not sweetened with sugar alcohols. Oral rehydration solutions, if approved by your health care provider. Seasoning and other  foods Bouillon, broth, or soups made from recommended foods. What foods are not recommended? The items listed may not be a complete list. Talk with your health care provider about what dietary choices are best for you. Grains Whole grain, whole wheat, bran, or rye breads, rolls, pastas, and crackers. Wild or brown rice. Whole grain or bran cereals. Barley. Oats and oatmeal. Corn tortillas or taco shells. Granola. Popcorn. Vegetables Raw vegetables. Fried vegetables. Cabbage, broccoli, Brussels sprouts, artichokes, baked beans, beet greens, corn, kale, legumes, peas, sweet potatoes, and yams. Potato skins. Cooked spinach and cabbage. Fruits Dried fruit, including raisins and dates. Raw fruits. Stewed or dried prunes. Canned fruits with syrup. Meat and other protein foods Fried or fatty meats. Deli meats. Chunky nut butters. Nuts and seeds. Beans and lentils. Berniece Salines. Hot dogs. Sausage. Dairy High-fat cheeses. Whole milk, chocolate milk, and beverages made with milk, such as milk shakes. Half-and-half. Cream. sour cream. Ice cream. Beverages Caffeinated beverages (such as coffee, tea, soda, or energy drinks). Alcoholic beverages. Fruit juices with pulp. Prune juice. Soft drinks sweetened with high-fructose corn syrup or sugar alcohols. High-calorie sports drinks. Fats and oils Butter. Cream sauces. Margarine. Salad oils. Plain salad dressings. Olives. Avocados. Mayonnaise. Sweets and desserts Sweet rolls, doughnuts, and sweet breads. Sugar-free desserts sweetened with sugar alcohols such as xylitol and sorbitol. Seasoning and other foods Honey. Hot sauce. Chili powder. Gravy. Cream-based or milk-based soups. Pancakes and waffles. Summary  When you have diarrhea, the foods you eat and your eating habits are very important.  Make sure you get at least 8-10 cups of fluid each day, or enough to keep your urine clear or pale yellow.  Eat bland foods and gradually reintroduce healthy, nutrient-rich  foods as tolerated, or as told by your health care provider.  Avoid high-fiber, fried, greasy, or spicy foods. This information is not intended to replace advice given to you by your health care provider. Make sure you discuss any questions you have with your health care provider. Document Released: 08/16/2003 Document Revised: 05/23/2016 Document Reviewed: 05/23/2016 Elsevier Interactive Patient Education  2017 [employer].

## 2017-01-07 ENCOUNTER — Other Ambulatory Visit: Payer: Self-pay | Admitting: Family Medicine

## 2017-01-07 DIAGNOSIS — R197 Diarrhea, unspecified: Secondary | ICD-10-CM | POA: Diagnosis not present

## 2017-01-12 ENCOUNTER — Other Ambulatory Visit: Payer: Self-pay

## 2017-01-12 LAB — CLOSTRIDIUM DIFFICILE EIA: C difficile Toxins A+B, EIA: NEGATIVE

## 2017-05-04 ENCOUNTER — Encounter: Payer: Self-pay | Admitting: Family Medicine

## 2017-05-04 ENCOUNTER — Ambulatory Visit: Payer: BLUE CROSS/BLUE SHIELD | Admitting: Family Medicine

## 2017-05-04 VITALS — BP 128/82 | HR 68 | Resp 16 | Ht 62.0 in | Wt 228.0 lb

## 2017-05-04 DIAGNOSIS — I10 Essential (primary) hypertension: Secondary | ICD-10-CM

## 2017-05-04 DIAGNOSIS — R350 Frequency of micturition: Secondary | ICD-10-CM | POA: Diagnosis not present

## 2017-05-04 DIAGNOSIS — N3090 Cystitis, unspecified without hematuria: Secondary | ICD-10-CM | POA: Diagnosis not present

## 2017-05-04 LAB — POCT URINALYSIS DIPSTICK
Bilirubin, UA: NEGATIVE
Glucose, UA: NEGATIVE
KETONES UA: NEGATIVE
Nitrite, UA: POSITIVE
PH UA: 7 (ref 5.0–8.0)
SPEC GRAV UA: 1.01 (ref 1.010–1.025)
UROBILINOGEN UA: 0.2 U/dL

## 2017-05-04 MED ORDER — SULFAMETHOXAZOLE-TRIMETHOPRIM 800-160 MG PO TABS: 1.0000 | ORAL_TABLET | Freq: Two times a day (BID) | ORAL | 0 refills | Status: DC

## 2017-05-04 NOTE — Patient Instructions (Signed)

## 2017-05-04 NOTE — Progress Notes (Signed)
Date:  05/04/2017   Name:  Shannon Blake   DOB:  [birth year]   MRN:  [id number]  PCP:  Juline Patch, MD    Chief Complaint: Dysuria (pain when finishing urination) and Nocturia   History of Present Illness:  This is a 61 y.o. female pt of Dr. Ronnald Ramp seen urgently for 4d hx dysuria and decreased appetite. No hx recurrent UTI.  Review of Systems:  Review of Systems  Constitutional: Negative for chills and fever.  Gastrointestinal: Negative for abdominal pain.  Genitourinary: Negative for flank pain.  Neurological: Negative for syncope and light-headedness.    Patient Active Problem List   Diagnosis Date Noted  . S/P total knee arthroplasty 08/27/2015  . Calculus of kidney 10/17/2014  . Alimentary obesity 10/17/2014  . Cough 10/17/2014  . Essential (primary) hypertension 10/17/2014  . Combined fat and carbohydrate induced hyperlipemia 10/17/2014  . Derangement of knee 12/01/2013  . Arthritis of knee, degenerative 12/01/2013    Prior to Admission medications   Medication Sig Start Date End Date Taking? Authorizing Provider  amLODipine-benazepril (LOTREL) 5-10 MG capsule Take 1 capsule by mouth daily. 12/29/16  Yes Juline Patch, MD  aspirin 81 MG chewable tablet Chew 81 mg by mouth daily.    Yes [provider]  Calcium Carb-Cholecalciferol (CALCIUM 600 + D PO) Take 1 tablet by mouth daily.   Yes [provider]  Multiple Vitamins-Minerals (CENTRUM SILVER ULTRA WOMENS) TABS Take 1 tablet by mouth daily.   Yes [provider]  sulfamethoxazole-trimethoprim (BACTRIM DS,SEPTRA DS) 800-160 MG tablet Take 1 tablet by mouth 2 (two) times daily. 05/04/17   Adline Potter, MD    No Known Allergies  Past Surgical History:  Procedure Laterality Date  . CHOLECYSTECTOMY    . COLONOSCOPY  2007   Dr Candace Cruise  . GALLBLADDER SURGERY    . KNEE ARTHROPLASTY Left 08/27/2015   Procedure: COMPUTER ASSISTED TOTAL KNEE ARTHROPLASTY;  Surgeon: Dereck Leep, MD;  Location:  [facility];  Service: Orthopedics;  Laterality: Left;  . meniscal surgery Left     Social History   Tobacco Use  . Smoking status: Never Smoker  . Smokeless tobacco: Never Used  Substance Use Topics  . Alcohol use: No    Alcohol/week: 0.0 oz  . Drug use: No    Family History  Problem Relation Age of Onset  . Hypertension Mother   . Hypertension Father   . Cancer Sister   . Breast cancer Neg Hx     Medication list has been reviewed and updated.  Physical Examination: BP 128/82   Pulse 68   Resp 16   Ht 5\' 2"  (1.575 m)   Wt 228 lb (103.4 kg)   SpO2 98%   BMI 41.70 kg/m   Physical Exam  Constitutional: She appears well-developed and well-nourished.  Abdominal: Soft. She exhibits no distension.  Mild suprapubic tenderness  Genitourinary:  Genitourinary Comments: No CVAT  Neurological: She is alert.  Skin: Skin is warm and dry.  Psychiatric: She has a normal mood and affect. Her behavior is normal.  Nursing note and vitals reviewed.   Assessment and Plan:  1. Cystitis Bactrim DS x 3d  2. Urine frequency UA sm blood, tr leuk, pos nit - POCT urinalysis dipstick  3. Essential (primary) hypertension Well controlled  Return if symptoms worsen or fail to improve.  Satira Anis. Javione Gunawan, [facility]  05/04/2017

## 2017-07-01 ENCOUNTER — Encounter: Payer: Self-pay | Admitting: Family Medicine

## 2017-07-01 ENCOUNTER — Ambulatory Visit: Payer: BLUE CROSS/BLUE SHIELD | Admitting: Family Medicine

## 2017-07-01 VITALS — BP 112/82 | HR 80 | Ht 62.0 in | Wt 234.0 lb

## 2017-07-01 DIAGNOSIS — Z6841 Body Mass Index (BMI) 40.0 and over, adult: Secondary | ICD-10-CM | POA: Diagnosis not present

## 2017-07-01 DIAGNOSIS — I10 Essential (primary) hypertension: Secondary | ICD-10-CM

## 2017-07-01 MED ORDER — AMLODIPINE BESY-BENAZEPRIL HCL 5-10 MG PO CAPS: 1.0000 | ORAL_CAPSULE | Freq: Every day | ORAL | 1 refills | Status: DC

## 2017-07-01 NOTE — Patient Instructions (Signed)
Calorie Counting for Weight Loss Calories are units of energy. Your body needs a certain amount of calories from food to keep you going throughout the day. When you eat more calories than your body needs, your body stores the extra calories as fat. When you eat fewer calories than your body needs, your body burns fat to get the energy it needs. Calorie counting means keeping track of how many calories you eat and drink each day. Calorie counting can be helpful if you need to lose weight. If you make sure to eat fewer calories than your body needs, you should lose weight. Ask your health care provider what a healthy weight is for you. For calorie counting to work, you will need to eat the right number of calories in a day in order to lose a healthy amount of weight per week. A dietitian can help you determine how many calories you need in a day and will give you suggestions on how to reach your calorie goal.  A healthy amount of weight to lose per week is usually 1-2 lb (0.5-0.9 kg). This usually means that your daily calorie intake should be reduced by 500-750 calories.  Eating 1,200 - 1,500 calories per day can help most women lose weight.  Eating 1,500 - 1,800 calories per day can help most men lose weight.  What is my plan? My goal is to have __________ calories per day. If I have this many calories per day, I should lose around __________ pounds per week. What do I need to know about calorie counting? In order to meet your daily calorie goal, you will need to:  Find out how many calories are in each food you would like to eat. Try to do this before you eat.  Decide how much of the food you plan to eat.  Write down what you ate and how many calories it had. Doing this is called keeping a food log.  To successfully lose weight, it is important to balance calorie counting with a healthy lifestyle that includes regular activity. Aim for 150 minutes of moderate exercise (such as walking) or 75  minutes of vigorous exercise (such as running) each week. Where do I find calorie information?  The number of calories in a food can be found on a Nutrition Facts label. If a food does not have a Nutrition Facts label, try to look up the calories online or ask your dietitian for help. Remember that calories are listed per serving. If you choose to have more than one serving of a food, you will have to multiply the calories per serving by the amount of servings you plan to eat. For example, the label on a package of bread might say that a serving size is 1 slice and that there are 90 calories in a serving. If you eat 1 slice, you will have eaten 90 calories. If you eat 2 slices, you will have eaten 180 calories. How do I keep a food log? Immediately after each meal, record the following information in your food log:  What you ate. Don't forget to include toppings, sauces, and other extras on the food.  How much you ate. This can be measured in cups, ounces, or number of items.  How many calories each food and drink had.  The total number of calories in the meal.  Keep your food log near you, such as in a small notebook in your pocket, or use a mobile app or website. Some   programs will calculate calories for you and show you how many calories you have left for the day to meet your goal. What are some calorie counting tips?  Use your calories on foods and drinks that will fill you up and not leave you hungry: ? Some examples of foods that fill you up are nuts and nut butters, vegetables, lean proteins, and high-fiber foods like whole grains. High-fiber foods are foods with more than 5 g fiber per serving. ? Drinks such as sodas, specialty coffee drinks, alcohol, and juices have a lot of calories, yet do not fill you up.  Eat nutritious foods and avoid empty calories. Empty calories are calories you get from foods or beverages that do not have many vitamins or protein, such as candy, sweets, and  soda. It is better to have a nutritious high-calorie food (such as an avocado) than a food with few nutrients (such as a bag of chips).  Know how many calories are in the foods you eat most often. This will help you calculate calorie counts faster.  Pay attention to calories in drinks. Low-calorie drinks include water and unsweetened drinks.  Pay attention to nutrition labels for "low fat" or "fat free" foods. These foods sometimes have the same amount of calories or more calories than the full fat versions. They also often have added sugar, starch, or salt, to make up for flavor that was removed with the fat.  Find a way of tracking calories that works for you. Get creative. Try different apps or programs if writing down calories does not work for you. What are some portion control tips?  Know how many calories are in a serving. This will help you know how many servings of a certain food you can have.  Use a measuring cup to measure serving sizes. You could also try weighing out portions on a kitchen scale. With time, you will be able to estimate serving sizes for some foods.  Take some time to put servings of different foods on your favorite plates, bowls, and cups so you know what a serving looks like.  Try not to eat straight from a bag or box. Doing this can lead to overeating. Put the amount you would like to eat in a cup or on a plate to make sure you are eating the right portion.  Use smaller plates, glasses, and bowls to prevent overeating.  Try not to multitask (for example, watch TV or use your computer) while eating. If it is time to eat, sit down at a table and enjoy your food. This will help you to know when you are full. It will also help you to be aware of what you are eating and how much you are eating. What are tips for following this plan? Reading food labels  Check the calorie count compared to the serving size. The serving size may be smaller than what you are used to  eating.  Check the source of the calories. Make sure the food you are eating is high in vitamins and protein and low in saturated and trans fats. Shopping  Read nutrition labels while you shop. This will help you make healthy decisions before you decide to purchase your food.  Make a grocery list and stick to it. Cooking  Try to cook your favorite foods in a healthier way. For example, try baking instead of frying.  Use low-fat dairy products. Meal planning  Use more fruits and vegetables. Half of your plate should   be fruits and vegetables.  Include lean proteins like poultry and fish. How do I count calories when eating out?  Ask for smaller portion sizes.  Consider sharing an entree and sides instead of getting your own entree.  If you get your own entree, eat only half. Ask for a box at the beginning of your meal and put the rest of your entree in it so you are not tempted to eat it.  If calories are listed on the menu, choose the lower calorie options.  Choose dishes that include vegetables, fruits, whole grains, low-fat dairy products, and lean protein.  Choose items that are boiled, broiled, grilled, or steamed. Stay away from items that are buttered, battered, fried, or served with cream sauce. Items labeled "crispy" are usually fried, unless stated otherwise.  Choose water, low-fat milk, unsweetened iced tea, or other drinks without added sugar. If you want an alcoholic beverage, choose a lower calorie option such as a glass of wine or light beer.  Ask for dressings, sauces, and syrups on the side. These are usually high in calories, so you should limit the amount you eat.  If you want a salad, choose a garden salad and ask for grilled meats. Avoid extra toppings like bacon, cheese, or fried items. Ask for the dressing on the side, or ask for olive oil and vinegar or lemon to use as dressing.  Estimate how many servings of a food you are given. For example, a serving of  cooked rice is  cup or about the size of half a baseball. Knowing serving sizes will help you be aware of how much food you are eating at restaurants. The list below tells you how big or small some common portion sizes are based on everyday objects: ? 1 oz-4 stacked dice. ? 3 oz-1 deck of cards. ? 1 tsp-1 die. ? 1 Tbsp- a ping-pong ball. ? 2 Tbsp-1 ping-pong ball. ?  cup- baseball. ? 1 cup-1 baseball. Summary  Calorie counting means keeping track of how many calories you eat and drink each day. If you eat fewer calories than your body needs, you should lose weight.  A healthy amount of weight to lose per week is usually 1-2 lb (0.5-0.9 kg). This usually means reducing your daily calorie intake by 500-750 calories.  The number of calories in a food can be found on a Nutrition Facts label. If a food does not have a Nutrition Facts label, try to look up the calories online or ask your dietitian for help.  Use your calories on foods and drinks that will fill you up, and not on foods and drinks that will leave you hungry.  Use smaller plates, glasses, and bowls to prevent overeating. This information is not intended to replace advice given to you by your health care provider. Make sure you discuss any questions you have with your health care provider. Document Released: 05/26/2005 Document Revised: 04/25/2016 Document Reviewed: 04/25/2016 Elsevier Interactive Patient Education  2018 [employer]  Preventing Unhealthy Weight Gain, Adult Staying at a healthy weight is important. When fat builds up in your body, you may become overweight or obese. These conditions put you at greater risk for developing certain health problems, such as heart disease, diabetes, sleeping problems, joint problems, and some cancers. Unhealthy weight gain is often the result of making unhealthy choices in what you eat. It is also a result of not getting enough exercise. You can make changes to your lifestyle to  prevent obesity   and stay as healthy as possible. What nutrition changes can be made? To maintain a healthy weight and prevent obesity:  Eat only as much as your body needs. To do this: ? Pay attention to signs that you are hungry or full. Stop eating as soon as you feel full. ? If you feel hungry, try drinking water first. Drink enough water so your urine is clear or pale yellow. ? Eat smaller portions. ? Look at serving sizes on food labels. Most foods contain more than one serving per container. ? Eat the recommended amount of calories for your gender and activity level. While most active people should eat around 2,000 calories per day, if you are trying to lose weight or are not very active, you main need to eat less calories. Talk to your health care provider or dietitian about how many calories you should eat each day.  Choose healthy foods, such as: ? Fruits and vegetables. Try to fill at least half of your plate at each meal with fruits and vegetables. ? Whole grains, such as whole wheat bread, brown rice, and quinoa. ? Lean meats, such as chicken or fish. ? Other healthy proteins, such as beans, eggs, or tofu. ? Healthy fats, such as nuts, seeds, fatty fish, and olive oil. ? Low-fat or fat-free dairy.  Check food labels and avoid food and drinks that: ? Are high in calories. ? Have added sugar. ? Are high in sodium. ? Have saturated fats or trans fats.  Limit how much you eat of the following foods: ? Prepackaged meals. ? Fast food. ? Fried foods. ? Processed meat, such as bacon, sausage, and deli meats. ? Fatty cuts of red meat and poultry with skin.  Cook foods in healthier ways, such as by baking, broiling, or grilling.  When grocery shopping, try to shop around the outside of the store. This helps you buy mostly fresh foods and avoid canned and prepackaged foods.  What lifestyle changes can be made?  Exercise at least 30 minutes 5 or more days each week. Exercising  includes brisk walking, yard work, biking, running, swimming, and team sports like basketball and soccer. Ask your health care provider which exercises are safe for you.  Do not use any products that contain nicotine or tobacco, such as cigarettes and e-cigarettes. If you need help quitting, ask your health care provider.  Limit alcohol intake to no more than 1 drink a day for nonpregnant women and 2 drinks a day for men. One drink equals 12 oz of beer, 5 oz of wine, or 1 oz of hard liquor.  Try to get 7-9 hours of sleep each night. What other changes can be made?  Keep a food and activity journal to keep track of: ? What you ate and how many calories you had. Remember to count sauces, dressings, and side dishes. ? Whether you were active, and what exercises you did. ? Your calorie, weight, and activity goals.  Check your weight regularly. Track any changes. If you notice you have gained weight, make changes to your diet or activity routine.  Avoid taking weight-loss medicines or supplements. Talk to your health care provider before starting any new medicine or supplement.  Talk to your health care provider before trying any new diet or exercise plan. Why are these changes important? Eating healthy, staying active, and having healthy habits not only help prevent obesity, they also:  Help you to manage stress and emotions.  Help you to connect with   friends and family.  Improve your self-esteem.  Improve your sleep.  Prevent long-term health problems.  What can happen if changes are not made? Being obese or overweight can cause you to develop joint or bone problems, which can make it hard for you to stay active or do activities you enjoy. Being obese or overweight also puts stress on your heart and lungs and can lead to health problems like diabetes, heart disease, and some cancers. Where to find more information: Talk with your health care provider or a dietitian about healthy  eating and healthy lifestyle choices. You may also find other information through these resources:  [place] Department of Agriculture MyPlate: www.choosemyplate.gov  American Heart Association: www.heart.org  Centers for Disease Control and Prevention: www.cdc.gov  Summary  Staying at a healthy weight is important. It helps prevent certain diseases and health problems, such as heart disease, diabetes, joint problems, sleep disorders, and some cancers.  Being obese or overweight can cause you to develop joint or bone problems, which can make it hard for you to stay active or do activities you enjoy.  You can prevent unhealthy weight gain by eating a healthy diet, exercising regularly, not smoking, limiting alcohol, and getting enough sleep.  Talk with your health care provider or a dietitian for guidance about healthy eating and healthy lifestyle choices. This information is not intended to replace advice given to you by your health care provider. Make sure you discuss any questions you have with your health care provider. Document Released: 05/27/2016 Document Revised: 07/02/2016 Document Reviewed: 07/02/2016 Elsevier Interactive Patient Education  2018 [employer] Inc.  

## 2017-07-01 NOTE — Progress Notes (Signed)
Name: Shannon Blake   MRN: [id number]    DOB: 06-10-1955   Date:07/01/2017       Progress Note  Subjective  Chief Complaint  Chief Complaint  Patient presents with  . Hypertension    Hypertension  This is a chronic problem. The current episode started more than 1 year ago. The problem is unchanged. The problem is controlled. Pertinent negatives include no anxiety, blurred vision, chest pain, headaches, malaise/fatigue, neck pain, orthopnea, palpitations, peripheral edema, PND, shortness of breath or sweats. There are no associated agents to hypertension. There are no known risk factors for coronary artery disease. Past treatments include ACE inhibitors and calcium channel blockers. The current treatment provides moderate improvement. There is no history of angina, kidney disease, CAD/MI, CVA, heart failure, left ventricular hypertrophy, PVD or retinopathy. There is no history of chronic renal disease, a hypertension causing med or renovascular disease.    No problem-specific Assessment & Plan notes found for this encounter.   Past Medical History:  Diagnosis Date  . Arthritis   . Hypertension     Past Surgical History:  Procedure Laterality Date  . CHOLECYSTECTOMY    . COLONOSCOPY  2007   Dr Candace Cruise  . GALLBLADDER SURGERY    . KNEE ARTHROPLASTY Left 08/27/2015   Procedure: COMPUTER ASSISTED TOTAL KNEE ARTHROPLASTY;  Surgeon: Dereck Leep, MD;  Location: [facility] ORS;  Service: Orthopedics;  Laterality: Left;  . meniscal surgery Left     Family History  Problem Relation Age of Onset  . Hypertension Mother   . Hypertension Father   . Cancer Sister   . Breast cancer Neg Hx     Social History   Socioeconomic History  . Marital status: Single    Spouse name: Not on file  . Number of children: Not on file  . Years of education: Not on file  . Highest education level: Not on file  Social Needs  . Financial resource strain: Not on file  . Food insecurity - worry: Not on file  .  Food insecurity - inability: Not on file  . Transportation needs - medical: Not on file  . Transportation needs - non-medical: Not on file  Occupational History  . Occupation: Optician, dispensing  Tobacco Use  . Smoking status: Never Smoker  . Smokeless tobacco: Never Used  Substance and Sexual Activity  . Alcohol use: No    Alcohol/week: 0.0 oz  . Drug use: No  . Sexual activity: Yes  Other Topics Concern  . Not on file  Social History Narrative  . Not on file    No Known Allergies  Outpatient Medications Prior to Visit  Medication Sig Dispense Refill  . aspirin 81 MG chewable tablet Chew 81 mg by mouth daily.     . Calcium Carb-Cholecalciferol (CALCIUM 600 + D PO) Take 1 tablet by mouth daily.    . Multiple Vitamins-Minerals (CENTRUM SILVER ULTRA WOMENS) TABS Take 1 tablet by mouth daily.    Marland Kitchen amLODipine-benazepril (LOTREL) 5-10 MG capsule Take 1 capsule by mouth daily. 90 capsule 1  . sulfamethoxazole-trimethoprim (BACTRIM DS,SEPTRA DS) 800-160 MG tablet Take 1 tablet by mouth 2 (two) times daily. 6 tablet 0   No facility-administered medications prior to visit.     Review of Systems  Constitutional: Negative for chills, fever, malaise/fatigue and weight loss.  HENT: Negative for ear discharge, ear pain and sore throat.   Eyes: Negative for blurred vision.  Respiratory: Negative for cough, sputum production, shortness of breath  and wheezing.   Cardiovascular: Negative for chest pain, palpitations, orthopnea, leg swelling and PND.  Gastrointestinal: Negative for abdominal pain, blood in stool, constipation, diarrhea, heartburn, melena and nausea.  Genitourinary: Negative for dysuria, frequency, hematuria and urgency.  Musculoskeletal: Negative for back pain, joint pain, myalgias and neck pain.  Skin: Negative for rash.  Neurological: Negative for dizziness, tingling, sensory change, focal weakness and headaches.  Endo/Heme/Allergies: Negative for environmental allergies and  polydipsia. Does not bruise/bleed easily.  Psychiatric/Behavioral: Negative for depression and suicidal ideas. The patient is not nervous/anxious and does not have insomnia.      Objective  Vitals:   07/01/17 0845  BP: 112/82  Pulse: 80  Weight: 234 lb (106.1 kg)  Height: 5\' 2"  (1.575 m)    Physical Exam  Constitutional: She is well-developed, well-nourished, and in no distress. No distress.  HENT:  Head: Normocephalic and atraumatic.  Right Ear: External ear normal.  Left Ear: External ear normal.  Nose: Nose normal.  Mouth/Throat: Oropharynx is clear and moist.  Eyes: Conjunctivae and EOM are normal. Pupils are equal, round, and reactive to light. Right eye exhibits no discharge. Left eye exhibits no discharge.  Neck: Normal range of motion. Neck supple. No JVD present. No thyromegaly present.  Cardiovascular: Normal rate, regular rhythm, normal heart sounds and intact distal pulses. Exam reveals no gallop and no friction rub.  No murmur heard. Pulmonary/Chest: Effort normal and breath sounds normal. She has no wheezes. She has no rales.  Abdominal: Soft. Bowel sounds are normal. She exhibits no mass. There is no tenderness. There is no guarding.  Musculoskeletal: Normal range of motion. She exhibits no edema.  Lymphadenopathy:    She has no cervical adenopathy.  Neurological: She is alert.  Skin: Skin is warm and dry. She is not diaphoretic.  Psychiatric: Mood and affect normal.  Nursing note and vitals reviewed.     Assessment & Plan  Problem List Items Addressed This Visit      Cardiovascular and Mediastinum   Essential (primary) hypertension - Primary   Relevant Medications   amLODipine-benazepril (LOTREL) 5-10 MG capsule    Other Visit Diagnoses    Class 3 severe obesity due to excess calories without serious comorbidity with body mass index (BMI) of 40.0 to 44.9 in adult [facility])          Meds ordered this encounter  Medications  . amLODipine-benazepril  (LOTREL) 5-10 MG capsule    Sig: Take 1 capsule by mouth daily.    Dispense:  90 capsule    Refill:  1      Dr. Otilio Miu [facility]  07/01/17

## 2017-07-20 ENCOUNTER — Encounter: Payer: Self-pay | Admitting: Obstetrics and Gynecology

## 2017-07-20 ENCOUNTER — Ambulatory Visit (INDEPENDENT_AMBULATORY_CARE_PROVIDER_SITE_OTHER): Payer: BLUE CROSS/BLUE SHIELD | Admitting: Obstetrics and Gynecology

## 2017-07-20 VITALS — BP 130/80 | HR 73 | Ht 62.0 in | Wt 233.0 lb

## 2017-07-20 DIAGNOSIS — Z1231 Encounter for screening mammogram for malignant neoplasm of breast: Secondary | ICD-10-CM | POA: Diagnosis not present

## 2017-07-20 DIAGNOSIS — Z01419 Encounter for gynecological examination (general) (routine) without abnormal findings: Secondary | ICD-10-CM

## 2017-07-20 DIAGNOSIS — Z808 Family history of malignant neoplasm of other organs or systems: Secondary | ICD-10-CM

## 2017-07-20 DIAGNOSIS — Z1239 Encounter for other screening for malignant neoplasm of breast: Secondary | ICD-10-CM

## 2017-07-20 DIAGNOSIS — Z1211 Encounter for screening for malignant neoplasm of colon: Secondary | ICD-10-CM

## 2017-07-20 NOTE — Progress Notes (Signed)
PCP: Juline Patch, MD   Chief Complaint  Patient presents with  . Gynecologic Exam    HPI:      [name] is a 62 y.o. G1P1001 who LMP was No LMP recorded. Patient is postmenopausal., presents today for her annual examination.  Her menses are absent due to menopause.  She does not have intermenstrual bleeding. She does not have vasomotor sx.   Sex activity: not sexually active. She does not have vaginal dryness.  Last Pap: July 17, 2015  Results were: no abnormalities /neg HPV DNA.  Hx of STDs: none  Last mammogram: Oct 21, 2016  Results were: normal--routine follow-up in 12 months There is a FH of breast cancer I her mat aunt. There is a FH of ovarian/peritoneal cancer in her sister. Sister is MyRisk neg through [facility] 6/16. The patient does do self-breast exams.  Colonoscopy: colonoscopy 11 years ago without abnormalities.  Repeat due after 10 years, so pt amenable to GI ref this yr.   Tobacco use: The patient denies current or previous tobacco use. Alcohol use: none Exercise: moderately active  She does get adequate calcium and Vitamin D in her diet.  Labs with PCP.   Past Medical History:  Diagnosis Date  . Arthritis   . Family history of ovarian cancer    Pt's affected sister is MyRisk neg  . Hypertension     Past Surgical History:  Procedure Laterality Date  . CHOLECYSTECTOMY    . COLONOSCOPY  2007   Dr Candace Cruise  . GALLBLADDER SURGERY    . KNEE ARTHROPLASTY Left 08/27/2015   Procedure: COMPUTER ASSISTED TOTAL KNEE ARTHROPLASTY;  Surgeon: Dereck Leep, MD;  Location: [facility] ORS;  Service: Orthopedics;  Laterality: Left;  . meniscal surgery Left     Family History  Problem Relation Age of Onset  . Hypertension Mother   . Hypertension Father   . Cancer Sister 27       peritoneal/ovarian cancer; MyRisk neg at [facility] oncology 6/16  . Breast cancer Maternal Aunt 88    Social History   Socioeconomic History  . Marital status: Single    Spouse  name: Not on file  . Number of children: Not on file  . Years of education: Not on file  . Highest education level: Not on file  Social Needs  . Financial resource strain: Not on file  . Food insecurity - worry: Not on file  . Food insecurity - inability: Not on file  . Transportation needs - medical: Not on file  . Transportation needs - non-medical: Not on file  Occupational History  . Occupation: Optician, dispensing  Tobacco Use  . Smoking status: Never Smoker  . Smokeless tobacco: Never Used  Substance and Sexual Activity  . Alcohol use: No    Alcohol/week: 0.0 oz  . Drug use: No  . Sexual activity: Yes  Other Topics Concern  . Not on file  Social History Narrative  . Not on file    Current Meds  Medication Sig  . amLODipine-benazepril (LOTREL) 5-10 MG capsule Take 1 capsule by mouth daily.  Marland Kitchen aspirin 81 MG chewable tablet Chew 81 mg by mouth daily.   . Calcium Carb-Cholecalciferol (CALCIUM 600 + D PO) Take 1 tablet by mouth daily.  . Multiple Vitamins-Minerals (CENTRUM SILVER ULTRA WOMENS) TABS Take 1 tablet by mouth daily.      ROS:  Review of Systems  Constitutional: Negative for fatigue, fever and unexpected weight change.  Respiratory: Negative for cough, shortness of breath and wheezing.   Cardiovascular: Negative for chest pain, palpitations and leg swelling.  Gastrointestinal: Positive for diarrhea and nausea. Negative for blood in stool, constipation and vomiting.  Endocrine: Negative for cold intolerance, heat intolerance and polyuria.  Genitourinary: Negative for dyspareunia, dysuria, flank pain, frequency, genital sores, hematuria, menstrual problem, pelvic pain, urgency, vaginal bleeding, vaginal discharge and vaginal pain.  Musculoskeletal: Negative for back pain, joint swelling and myalgias.  Skin: Negative for rash.  Neurological: Negative for dizziness, syncope, light-headedness, numbness and headaches.  Hematological: Negative for adenopathy.    Psychiatric/Behavioral: Negative for agitation, confusion, sleep disturbance and suicidal ideas. The patient is not nervous/anxious.      Objective: BP 130/80   Pulse 73   Ht 5\' 2"  (1.575 m)   Wt 233 lb (105.7 kg)   BMI 42.62 kg/m    Physical Exam  Constitutional: She is oriented to person, place, and time. She appears well-developed and well-nourished.  Genitourinary: Vagina normal and uterus normal. There is no rash or tenderness on the right labia. There is no rash or tenderness on the left labia. No erythema or tenderness in the vagina. No vaginal discharge found. Right adnexum does not display mass and does not display tenderness. Left adnexum does not display mass and does not display tenderness. Cervix does not exhibit motion tenderness or polyp. Uterus is not enlarged or tender.  Neck: Normal range of motion. No thyromegaly present.  Cardiovascular: Normal rate, regular rhythm and normal heart sounds.  No murmur heard. Pulmonary/Chest: Effort normal and breath sounds normal. Right breast exhibits no mass, no nipple discharge, no skin change and no tenderness. Left breast exhibits no mass, no nipple discharge, no skin change and no tenderness.  Abdominal: Soft. There is no tenderness. There is no guarding.  Musculoskeletal: Normal range of motion.  Neurological: She is alert and oriented to person, place, and time. No cranial nerve deficit.  Psychiatric: She has a normal mood and affect. Her behavior is normal.  Vitals reviewed.   Assessment/Plan:  Encounter for annual routine gynecological examination  Screening for breast cancer - Pt to sched mamo - Plan: MM DIGITAL SCREENING BILATERAL  Family history of peritoneal cancer - Sister; MyRisk neg.  Screening for colon cancer - GI ref to Dr. Allen Norris for scr colonoscopy - Plan: Ambulatory referral to Gastroenterology         GYN counsel breast self exam, mammography screening, menopause, adequate intake of calcium and vitamin  D, diet and exercise    F/U  Return in about 1 year (around 07/20/2018).  Graceland Wachter B. Adarius Tigges, PA-C 07/20/2017 11:07 AM

## 2017-07-20 NOTE — Patient Instructions (Addendum)
I value your feedback and entrusting us with your care. If you get a [facility] patient survey, I would appreciate you taking the time to let us know about your experience today. Thank you!  [facility]: [phone]    

## 2017-07-21 ENCOUNTER — Ambulatory Visit: Payer: BLUE CROSS/BLUE SHIELD | Admitting: Family Medicine

## 2017-07-21 ENCOUNTER — Encounter: Payer: Self-pay | Admitting: Family Medicine

## 2017-07-21 VITALS — BP 120/80 | HR 80 | Temp 98.5°F | Ht 62.0 in | Wt 232.0 lb

## 2017-07-21 DIAGNOSIS — N762 Acute vulvitis: Secondary | ICD-10-CM | POA: Diagnosis not present

## 2017-07-21 DIAGNOSIS — N9089 Other specified noninflammatory disorders of vulva and perineum: Secondary | ICD-10-CM | POA: Diagnosis not present

## 2017-07-21 DIAGNOSIS — N3090 Cystitis, unspecified without hematuria: Secondary | ICD-10-CM | POA: Diagnosis not present

## 2017-07-21 LAB — POCT URINALYSIS DIPSTICK
BILIRUBIN UA: NEGATIVE
GLUCOSE UA: NEGATIVE
Ketones, UA: NEGATIVE
Nitrite, UA: POSITIVE
PH UA: 5 (ref 5.0–8.0)
Protein, UA: NEGATIVE
SPEC GRAV UA: 1.015 (ref 1.010–1.025)
UROBILINOGEN UA: 0.2 U/dL

## 2017-07-21 MED ORDER — FLUCONAZOLE 150 MG PO TABS: 150.0000 mg | ORAL_TABLET | Freq: Once | ORAL | 0 refills | Status: AC

## 2017-07-21 MED ORDER — NITROFURANTOIN MONOHYD MACRO 100 MG PO CAPS: 100.0000 mg | ORAL_CAPSULE | Freq: Two times a day (BID) | ORAL | 0 refills | Status: DC

## 2017-07-21 NOTE — Progress Notes (Signed)
Name: Shannon Blake   MRN: [id number]    DOB: Sep 23, 1955   Date:07/21/2017       Progress Note  Subjective  Chief Complaint  Chief Complaint  Patient presents with  . Urinary Tract Infection    burning when urinates, frequent urination, pressure in lower abdomen x 2-3 days    Urinary Tract Infection   This is a new problem. The current episode started in the past 7 days. The problem occurs every urination. The problem has been gradually worsening. The quality of the pain is described as burning. The pain is at a severity of 3/10. The pain is mild. There has been no fever. Associated symptoms include frequency and urgency. Pertinent negatives include no chills, discharge, flank pain, hematuria, hesitancy, nausea, sweats or vomiting. She has tried nothing for the symptoms. Her past medical history is significant for recurrent UTIs.    No problem-specific Assessment & Plan notes found for this encounter.   Past Medical History:  Diagnosis Date  . Arthritis   . Family history of ovarian cancer    Pt's affected sister is MyRisk neg  . Hypertension     Past Surgical History:  Procedure Laterality Date  . CHOLECYSTECTOMY    . COLONOSCOPY  2007   Dr Candace Cruise  . GALLBLADDER SURGERY    . KNEE ARTHROPLASTY Left 08/27/2015   Procedure: COMPUTER ASSISTED TOTAL KNEE ARTHROPLASTY;  Surgeon: Dereck Leep, MD;  Location: [facility] ORS;  Service: Orthopedics;  Laterality: Left;  . meniscal surgery Left     Family History  Problem Relation Age of Onset  . Hypertension Mother   . Hypertension Father   . Cancer Sister 86       peritoneal/ovarian cancer; MyRisk neg at [facility] oncology 6/16  . Breast cancer Maternal Aunt 88    Social History   Socioeconomic History  . Marital status: Single    Spouse name: Not on file  . Number of children: Not on file  . Years of education: Not on file  . Highest education level: Not on file  Social Needs  . Financial resource strain: Not on file  . Food  insecurity - worry: Not on file  . Food insecurity - inability: Not on file  . Transportation needs - medical: Not on file  . Transportation needs - non-medical: Not on file  Occupational History  . Occupation: Optician, dispensing  Tobacco Use  . Smoking status: Never Smoker  . Smokeless tobacco: Never Used  Substance and Sexual Activity  . Alcohol use: No    Alcohol/week: 0.0 oz  . Drug use: No  . Sexual activity: Yes  Other Topics Concern  . Not on file  Social History Narrative  . Not on file    No Known Allergies  Outpatient Medications Prior to Visit  Medication Sig Dispense Refill  . amLODipine-benazepril (LOTREL) 5-10 MG capsule Take 1 capsule by mouth daily. 90 capsule 1  . aspirin 81 MG chewable tablet Chew 81 mg by mouth daily.     . Calcium Carb-Cholecalciferol (CALCIUM 600 + D PO) Take 1 tablet by mouth daily.    . Multiple Vitamins-Minerals (CENTRUM SILVER ULTRA WOMENS) TABS Take 1 tablet by mouth daily.     No facility-administered medications prior to visit.     Review of Systems  Constitutional: Negative for chills, fever, malaise/fatigue and weight loss.  HENT: Negative for ear discharge, ear pain and sore throat.   Eyes: Negative for blurred vision.  Respiratory: Negative for  cough, sputum production, shortness of breath and wheezing.   Cardiovascular: Negative for chest pain, palpitations and leg swelling.  Gastrointestinal: Negative for abdominal pain, blood in stool, constipation, diarrhea, heartburn, melena, nausea and vomiting.  Genitourinary: Positive for frequency and urgency. Negative for dysuria, flank pain, hematuria and hesitancy.  Musculoskeletal: Negative for back pain, joint pain, myalgias and neck pain.  Skin: Negative for rash.  Neurological: Negative for dizziness, tingling, sensory change, focal weakness and headaches.  Endo/Heme/Allergies: Negative for environmental allergies and polydipsia. Does not bruise/bleed easily.   Psychiatric/Behavioral: Negative for depression and suicidal ideas. The patient is not nervous/anxious and does not have insomnia.      Objective  Vitals:   07/21/17 1332  BP: 120/80  Pulse: 80  Temp: 98.5 F (36.9 C)  TempSrc: Oral  Weight: 232 lb (105.2 kg)  Height: 5\' 2"  (1.575 m)    Physical Exam  Constitutional: She is well-developed, well-nourished, and in no distress. No distress.  HENT:  Head: Normocephalic and atraumatic.  Right Ear: External ear normal.  Left Ear: External ear normal.  Nose: Nose normal.  Mouth/Throat: Oropharynx is clear and moist.  Eyes: Conjunctivae and EOM are normal. Pupils are equal, round, and reactive to light. Right eye exhibits no discharge. Left eye exhibits no discharge.  Neck: Normal range of motion. Neck supple. No JVD present. No thyromegaly present.  Cardiovascular: Normal rate, regular rhythm, normal heart sounds and intact distal pulses. Exam reveals no gallop and no friction rub.  No murmur heard. Pulmonary/Chest: Effort normal and breath sounds normal. She has no wheezes. She has no rales.  Abdominal: Soft. Bowel sounds are normal. She exhibits no mass. There is no tenderness. There is no guarding and no CVA tenderness.  Genitourinary: Vulva exhibits erythema.  Musculoskeletal: Normal range of motion. She exhibits no edema.  Lymphadenopathy:    She has no cervical adenopathy.  Neurological: She is alert. She has normal reflexes.  Skin: Skin is warm and dry. She is not diaphoretic.  Psychiatric: Mood and affect normal.  Nursing note and vitals reviewed.     Assessment & Plan  Problem List Items Addressed This Visit    None    Visit Diagnoses    Cystitis    -  Primary   Relevant Medications   nitrofurantoin, macrocrystal-monohydrate, (MACROBID) 100 MG capsule   Other Relevant Orders   POCT urinalysis dipstick (Completed)   Urine Culture   Acute vulvitis       Relevant Medications   fluconazole (DIFLUCAN) 150 MG  tablet      Meds ordered this encounter  Medications  . nitrofurantoin, macrocrystal-monohydrate, (MACROBID) 100 MG capsule    Sig: Take 1 capsule (100 mg total) by mouth 2 (two) times daily.    Dispense:  10 capsule    Refill:  0  . fluconazole (DIFLUCAN) 150 MG tablet    Sig: Take 1 tablet (150 mg total) by mouth once for 1 dose. Repeat second dose in 1 week    Dispense:  1 tablet    Refill:  0      Dr. Otilio Miu [facility] [employer] [facility]  07/21/17

## 2017-07-21 NOTE — Patient Instructions (Signed)

## 2017-07-22 ENCOUNTER — Telehealth: Payer: Self-pay

## 2017-07-22 ENCOUNTER — Other Ambulatory Visit: Payer: Self-pay

## 2017-07-22 DIAGNOSIS — Z1211 Encounter for screening for malignant neoplasm of colon: Secondary | ICD-10-CM

## 2017-07-22 NOTE — Telephone Encounter (Signed)
Gastroenterology Pre-Procedure Review  Request Date: 08/11/17 Requesting Physician: Dr. Allen Norris  PATIENT REVIEW QUESTIONS: The patient responded to the following health history questions as indicated:    1. Are you having any GI issues? no 2. Do you have a personal history of Polyps? no 3. Do you have a family history of Colon Cancer or Polyps? no 4. Diabetes Mellitus? no 5. Joint replacements in the past 12 months?no 6. Major health problems in the past 3 months?no 7. Any artificial heart valves, MVP, or defibrillator?no    MEDICATIONS & ALLERGIES:    Patient reports the following regarding taking any anticoagulation/antiplatelet therapy:   Plavix, Coumadin, Eliquis, Xarelto, Lovenox, Pradaxa, Brilinta, or Effient? no Aspirin? yes (81 mg daily)  Patient confirms/reports the following medications:  Current Outpatient Medications  Medication Sig Dispense Refill  . amLODipine-benazepril (LOTREL) 5-10 MG capsule Take 1 capsule by mouth daily. 90 capsule 1  . aspirin 81 MG chewable tablet Chew 81 mg by mouth daily.     . Calcium Carb-Cholecalciferol (CALCIUM 600 + D PO) Take 1 tablet by mouth daily.    . Multiple Vitamins-Minerals (CENTRUM SILVER ULTRA WOMENS) TABS Take 1 tablet by mouth daily.    . nitrofurantoin, macrocrystal-monohydrate, (MACROBID) 100 MG capsule Take 1 capsule (100 mg total) by mouth 2 (two) times daily. 10 capsule 0   No current facility-administered medications for this visit.     Patient confirms/reports the following allergies:  No Known Allergies  No orders of the defined types were placed in this encounter.   AUTHORIZATION INFORMATION Primary Insurance: 1D#: Group #:  Secondary Insurance: 1D#: Group #:  SCHEDULE INFORMATION: Date: 08/11/17 Time: Location:[facility]

## 2017-07-24 ENCOUNTER — Ambulatory Visit: Payer: BLUE CROSS/BLUE SHIELD | Admitting: Family Medicine

## 2017-07-24 ENCOUNTER — Encounter: Payer: Self-pay | Admitting: Family Medicine

## 2017-07-24 VITALS — BP 120/80 | HR 68 | Ht 62.0 in | Wt 232.0 lb

## 2017-07-24 DIAGNOSIS — N3090 Cystitis, unspecified without hematuria: Secondary | ICD-10-CM

## 2017-07-24 DIAGNOSIS — R102 Pelvic and perineal pain: Secondary | ICD-10-CM | POA: Diagnosis not present

## 2017-07-24 DIAGNOSIS — R300 Dysuria: Secondary | ICD-10-CM

## 2017-07-24 LAB — POCT URINALYSIS DIPSTICK
Bilirubin, UA: NEGATIVE
Glucose, UA: NEGATIVE
Ketones, UA: NEGATIVE
Leukocytes, UA: NEGATIVE
NITRITE UA: NEGATIVE
Protein, UA: NEGATIVE
RBC UA: NEGATIVE
SPEC GRAV UA: 1.015 (ref 1.010–1.025)
UROBILINOGEN UA: 0.2 U/dL
pH, UA: 5 (ref 5.0–8.0)

## 2017-07-24 LAB — URINE CULTURE

## 2017-07-24 LAB — SPECIMEN STATUS REPORT

## 2017-07-24 MED ORDER — NITROFURANTOIN MONOHYD MACRO 100 MG PO CAPS: 100.0000 mg | ORAL_CAPSULE | Freq: Two times a day (BID) | ORAL | 0 refills | Status: DC

## 2017-07-24 MED ORDER — NYSTATIN 100000 UNIT/GM EX CREA: 1.0000 "application " | TOPICAL_CREAM | Freq: Two times a day (BID) | CUTANEOUS | 0 refills | Status: DC

## 2017-07-24 NOTE — Progress Notes (Signed)
Name: Shannon Blake   MRN: [id number]    DOB: May 31, 1956   Date:07/24/2017       Progress Note  Subjective  Chief Complaint  Chief Complaint  Patient presents with  . Vaginal Pain    "feels a little better today"- currently taking Macrobid bid and has had a diflucan on th 12th and is to repeat next Tuesday    Vaginal Pain  The patient's pertinent negatives include no genital itching, genital lesions, genital odor, genital rash, missed menses, pelvic pain, vaginal bleeding or vaginal discharge. This is a new problem. The current episode started in the past 7 days. The problem occurs intermittently. The problem has been waxing and waning. The pain is moderate (currently none). Pertinent negatives include no abdominal pain, anorexia, back pain, chills, constipation, diarrhea, discolored urine, dysuria, fever, flank pain, frequency, headaches, hematuria, joint pain, joint swelling, nausea, painful intercourse, rash, sore throat, urgency or vomiting.    No problem-specific Assessment & Plan notes found for this encounter.   Past Medical History:  Diagnosis Date  . Arthritis   . Family history of ovarian cancer    Pt's affected sister is MyRisk neg  . Hypertension     Past Surgical History:  Procedure Laterality Date  . CHOLECYSTECTOMY    . COLONOSCOPY  2007   Dr Candace Cruise  . GALLBLADDER SURGERY    . KNEE ARTHROPLASTY Left 08/27/2015   Procedure: COMPUTER ASSISTED TOTAL KNEE ARTHROPLASTY;  Surgeon: Dereck Leep, MD;  Location: [facility] ORS;  Service: Orthopedics;  Laterality: Left;  . meniscal surgery Left     Family History  Problem Relation Age of Onset  . Hypertension Mother   . Hypertension Father   . Cancer Sister 68       peritoneal/ovarian cancer; MyRisk neg at [facility] oncology 6/16  . Breast cancer Maternal Aunt 88    Social History   Socioeconomic History  . Marital status: Single    Spouse name: Not on file  . Number of children: Not on file  . Years of education: Not on file   . Highest education level: Not on file  Social Needs  . Financial resource strain: Not on file  . Food insecurity - worry: Not on file  . Food insecurity - inability: Not on file  . Transportation needs - medical: Not on file  . Transportation needs - non-medical: Not on file  Occupational History  . Occupation: Optician, dispensing  Tobacco Use  . Smoking status: Never Smoker  . Smokeless tobacco: Never Used  Substance and Sexual Activity  . Alcohol use: No    Alcohol/week: 0.0 oz  . Drug use: No  . Sexual activity: Yes  Other Topics Concern  . Not on file  Social History Narrative  . Not on file    No Known Allergies  Outpatient Medications Prior to Visit  Medication Sig Dispense Refill  . amLODipine-benazepril (LOTREL) 5-10 MG capsule Take 1 capsule by mouth daily. 90 capsule 1  . aspirin 81 MG chewable tablet Chew 81 mg by mouth daily.     . Calcium Carb-Cholecalciferol (CALCIUM 600 + D PO) Take 1 tablet by mouth daily.    . Multiple Vitamins-Minerals (CENTRUM SILVER ULTRA WOMENS) TABS Take 1 tablet by mouth daily.    . nitrofurantoin, macrocrystal-monohydrate, (MACROBID) 100 MG capsule Take 1 capsule (100 mg total) by mouth 2 (two) times daily. 10 capsule 0   No facility-administered medications prior to visit.     Review of Systems  Constitutional: Negative for chills, fever, malaise/fatigue and weight loss.  HENT: Negative for ear discharge, ear pain and sore throat.   Eyes: Negative for blurred vision.  Respiratory: Negative for cough, sputum production, shortness of breath and wheezing.   Cardiovascular: Negative for chest pain, palpitations and leg swelling.  Gastrointestinal: Negative for abdominal pain, anorexia, blood in stool, constipation, diarrhea, heartburn, melena, nausea and vomiting.  Genitourinary: Positive for vaginal pain. Negative for dysuria, flank pain, frequency, hematuria, missed menses, pelvic pain, urgency and vaginal discharge.  Musculoskeletal:  Negative for back pain, joint pain, myalgias and neck pain.  Skin: Negative for rash.  Neurological: Negative for dizziness, tingling, sensory change, focal weakness and headaches.  Endo/Heme/Allergies: Negative for environmental allergies and polydipsia. Does not bruise/bleed easily.  Psychiatric/Behavioral: Negative for depression and suicidal ideas. The patient is not nervous/anxious and does not have insomnia.      Objective  Vitals:   07/24/17 0805  BP: 120/80  Pulse: 68  Weight: 232 lb (105.2 kg)  Height: 5\' 2"  (1.575 m)    Physical Exam  Constitutional: She is well-developed, well-nourished, and in no distress. No distress.  HENT:  Head: Normocephalic and atraumatic.  Right Ear: External ear normal.  Left Ear: External ear normal.  Nose: Nose normal.  Mouth/Throat: Oropharynx is clear and moist.  Eyes: Conjunctivae and EOM are normal. Pupils are equal, round, and reactive to light. Right eye exhibits no discharge. Left eye exhibits no discharge.  Neck: Normal range of motion. Neck supple. No JVD present. No thyromegaly present.  Cardiovascular: Normal rate, regular rhythm, normal heart sounds and intact distal pulses. Exam reveals no gallop and no friction rub.  No murmur heard. Pulmonary/Chest: Effort normal and breath sounds normal.  Abdominal: Soft. Bowel sounds are normal. She exhibits no distension. There is no tenderness. There is no rebound and no guarding.  Genitourinary: Vagina normal, uterus normal and cervix normal. No vaginal discharge found.  Musculoskeletal: Normal range of motion.  Lymphadenopathy:    She has no cervical adenopathy.  Neurological: She is alert. She has normal reflexes.  Skin: Skin is warm and dry. She is not diaphoretic.  Psychiatric: Mood and affect normal.  Nursing note and vitals reviewed.     Assessment & Plan  Problem List Items Addressed This Visit    None    Visit Diagnoses    Vaginal pain    -  Primary   Relevant  Medications   nystatin cream (MYCOSTATIN)   Cystitis       Relevant Medications   nitrofurantoin, macrocrystal-monohydrate, (MACROBID) 100 MG capsule   Other Relevant Orders   POCT Urinalysis Dipstick (Completed)   Dysuria       Relevant Medications   nitrofurantoin, macrocrystal-monohydrate, (MACROBID) 100 MG capsule   Other Relevant Orders   POCT Urinalysis Dipstick (Completed)      Meds ordered this encounter  Medications  . nitrofurantoin, macrocrystal-monohydrate, (MACROBID) 100 MG capsule    Sig: Take 1 capsule (100 mg total) by mouth 2 (two) times daily.    Dispense:  10 capsule    Refill:  0  . nystatin cream (MYCOSTATIN)    Sig: Apply 1 application topically 2 (two) times daily.    Dispense:  30 g    Refill:  0      Dr. [facility]  07/24/17

## 2017-07-28 ENCOUNTER — Telehealth: Payer: Self-pay | Admitting: Gastroenterology

## 2017-07-28 NOTE — Telephone Encounter (Signed)
Patient called & l/m stating she has a colonoscopy scheduled for 08-11-17 with Dr Allen Norris and has not received pw.

## 2017-07-28 NOTE — Telephone Encounter (Signed)
Patient called again wanting instructions & to know what to take.

## 2017-07-29 NOTE — Telephone Encounter (Signed)
Patients call has been returned in regards to her colonoscopy instructions.  I offered to email her instructions but she does not email. She will come to office to pick up.

## 2017-08-11 ENCOUNTER — Ambulatory Visit: Payer: BLUE CROSS/BLUE SHIELD | Admitting: Certified Registered Nurse Anesthetist

## 2017-08-11 ENCOUNTER — Ambulatory Visit
Admission: RE | Admit: 2017-08-11 | Discharge: 2017-08-11 | Disposition: A | Discharge status: Home / Self Care | Payer: BLUE CROSS/BLUE SHIELD | Source: Ambulatory Visit | Attending: Gastroenterology | Admitting: Gastroenterology

## 2017-08-11 ENCOUNTER — Encounter: Payer: Self-pay | Admitting: *Deleted

## 2017-08-11 ENCOUNTER — Encounter
Admission: RE | Disposition: A | Discharge status: Home / Self Care | Payer: Self-pay | Source: Ambulatory Visit | Attending: Gastroenterology

## 2017-08-11 ENCOUNTER — Other Ambulatory Visit: Payer: Self-pay

## 2017-08-11 DIAGNOSIS — Z79899 Other long term (current) drug therapy: Secondary | ICD-10-CM | POA: Insufficient documentation

## 2017-08-11 DIAGNOSIS — Z9049 Acquired absence of other specified parts of digestive tract: Secondary | ICD-10-CM | POA: Diagnosis not present

## 2017-08-11 DIAGNOSIS — K640 First degree hemorrhoids: Secondary | ICD-10-CM | POA: Diagnosis not present

## 2017-08-11 DIAGNOSIS — K5790 Diverticulosis of intestine, part unspecified, without perforation or abscess without bleeding: Secondary | ICD-10-CM | POA: Diagnosis not present

## 2017-08-11 DIAGNOSIS — D125 Benign neoplasm of sigmoid colon: Secondary | ICD-10-CM | POA: Diagnosis not present

## 2017-08-11 DIAGNOSIS — K5730 Diverticulosis of large intestine without perforation or abscess without bleeding: Secondary | ICD-10-CM | POA: Insufficient documentation

## 2017-08-11 DIAGNOSIS — Z7982 Long term (current) use of aspirin: Secondary | ICD-10-CM | POA: Diagnosis not present

## 2017-08-11 DIAGNOSIS — Z1211 Encounter for screening for malignant neoplasm of colon: Secondary | ICD-10-CM

## 2017-08-11 DIAGNOSIS — I10 Essential (primary) hypertension: Secondary | ICD-10-CM | POA: Diagnosis not present

## 2017-08-11 DIAGNOSIS — K635 Polyp of colon: Secondary | ICD-10-CM | POA: Diagnosis not present

## 2017-08-11 HISTORY — PX: COLONOSCOPY WITH PROPOFOL: SHX5780

## 2017-08-11 SURGERY — COLONOSCOPY WITH PROPOFOL
Anesthesia: General

## 2017-08-11 MED ORDER — PROPOFOL 10 MG/ML IV BOLUS
INTRAVENOUS | Status: AC
  Filled 2017-08-11: qty 20

## 2017-08-11 MED ORDER — LIDOCAINE HCL (PF) 2 % IJ SOLN
INTRAMUSCULAR | Status: AC
  Filled 2017-08-11: qty 10

## 2017-08-11 MED ORDER — LIDOCAINE HCL (CARDIAC) 20 MG/ML IV SOLN
INTRAVENOUS | Status: DC | PRN
  Administered 2017-08-11: 50 mg via INTRAVENOUS

## 2017-08-11 MED ORDER — SODIUM CHLORIDE 0.9 % IV SOLN
INTRAVENOUS | Status: DC
  Administered 2017-08-11: 08:00:00 via INTRAVENOUS

## 2017-08-11 MED ORDER — PROPOFOL 10 MG/ML IV BOLUS
INTRAVENOUS | Status: DC | PRN
  Administered 2017-08-11: 40 mg via INTRAVENOUS
  Administered 2017-08-11: 70 mg via INTRAVENOUS
  Administered 2017-08-11: 30 mg via INTRAVENOUS
  Administered 2017-08-11 (×3): 40 mg via INTRAVENOUS

## 2017-08-11 NOTE — Op Note (Signed)
[facility] Patient Name: Shannon Blake Procedure Date: 08/11/2017 8:38 AM MRN: [id number] Account #: [id number] Date of Birth: 1955/07/11 Admit Type: Outpatient Age: 62 Room: [facility] ENDO ROOM 4 Gender: Female Note Status: Finalized Procedure:            Colonoscopy Indications:          Screening for colorectal malignant neoplasm Providers:            Lucilla Lame MD, MD Referring MD:         Juline Patch, MD (Referring MD) Medicines:            Propofol per Anesthesia Complications:        No immediate complications. Procedure:            Pre-Anesthesia Assessment:                       - Prior to the procedure, a History and Physical was                        performed, and patient medications and allergies were                        reviewed. The patient's tolerance of previous                        anesthesia was also reviewed. The risks and benefits of                        the procedure and the sedation options and risks were                        discussed with the patient. All questions were                        answered, and informed consent was obtained. Prior                        Anticoagulants: The patient has taken no previous                        anticoagulant or antiplatelet agents. ASA Grade                        Assessment: II - A patient with mild systemic disease.                        After reviewing the risks and benefits, the patient was                        deemed in satisfactory condition to undergo the                        procedure.                       After obtaining informed consent, the colonoscope was                        passed under direct vision. Throughout the procedure,  the patient's blood pressure, pulse, and oxygen                        saturations were monitored continuously. The                        Colonoscope was introduced through the anus and      advanced to the the cecum, identified by appendiceal                        orifice and ileocecal valve. The colonoscopy was                        performed without difficulty. The patient tolerated the                        procedure well. The quality of the bowel preparation                        was excellent. Findings:      The perianal and digital rectal examinations were normal.      A 2 mm polyp was found in the sigmoid colon. The polyp was sessile. The       polyp was removed with a cold biopsy forceps. Resection and retrieval       were complete.      Multiple small-mouthed diverticula were found in the sigmoid colon.      Non-bleeding internal hemorrhoids were found during retroflexion. The       hemorrhoids were Grade I (internal hemorrhoids that do not prolapse). Impression:           - One 2 mm polyp in the sigmoid colon, removed with a                        cold biopsy forceps. Resected and retrieved.                       - Diverticulosis in the sigmoid colon.                       - Non-bleeding internal hemorrhoids. Recommendation:       - Discharge patient to home.                       - Resume previous diet.                       - Continue present medications.                       - Await pathology results.                       - Repeat colonoscopy in 5 years if polyp adenoma and 10                        years if hyperplastic Procedure Code(s):    --- Professional ---                       [phone], Colonoscopy, flexible; with biopsy, single or  multiple Diagnosis Code(s):    --- Professional ---                       Z12.11, Encounter for screening for malignant neoplasm                        of colon                       D12.5, Benign neoplasm of sigmoid colon CPT copyright 2016 American Medical Association. All rights reserved. The codes documented in this report are preliminary and upon coder review may  be revised to meet  current compliance requirements. Lucilla Lame MD, MD 08/11/2017 9:00:02 AM This report has been signed electronically. Number of Addenda: 0 Note Initiated On: 08/11/2017 8:38 AM Scope Withdrawal Time: 0 hours 7 minutes 14 seconds  Total Procedure Duration: 0 hours 9 minutes 32 seconds       [facility]

## 2017-08-11 NOTE — H&P (Signed)
Lucilla Lame, MD [facility] [address], [place], [place] [address] Phone: [phone] Fax : [phone]  Primary Care Physician:  Juline Patch, MD Primary Gastroenterologist:  Dr. Allen Norris  Pre-Procedure History & Physical: HPI:  Shannon Blake is a 62 y.o. female is here for a screening colonoscopy.   Past Medical History:  Diagnosis Date  . Arthritis   . Family history of ovarian cancer    Pt's affected sister is MyRisk neg  . Hypertension     Past Surgical History:  Procedure Laterality Date  . CHOLECYSTECTOMY    . COLONOSCOPY  2007   Dr Candace Cruise  . GALLBLADDER SURGERY    . KNEE ARTHROPLASTY Left 08/27/2015   Procedure: COMPUTER ASSISTED TOTAL KNEE ARTHROPLASTY;  Surgeon: Dereck Leep, MD;  Location: [facility] ORS;  Service: Orthopedics;  Laterality: Left;  . meniscal surgery Left     Prior to Admission medications   Medication Sig Start Date End Date Taking? Authorizing Provider  amLODipine-benazepril (LOTREL) 5-10 MG capsule Take 1 capsule by mouth daily. 07/01/17  Yes Juline Patch, MD  aspirin 81 MG chewable tablet Chew 81 mg by mouth daily.    Yes [provider]  Calcium Carb-Cholecalciferol (CALCIUM 600 + D PO) Take 1 tablet by mouth daily.   Yes [provider]  Multiple Vitamins-Minerals (CENTRUM SILVER ULTRA WOMENS) TABS Take 1 tablet by mouth daily.   Yes [provider]  nitrofurantoin, macrocrystal-monohydrate, (MACROBID) 100 MG capsule Take 1 capsule (100 mg total) by mouth 2 (two) times daily. Patient not taking: Reported on 08/11/2017 07/24/17   Juline Patch, MD  nystatin cream (MYCOSTATIN) Apply 1 application topically 2 (two) times daily. Patient not taking: Reported on 08/11/2017 07/24/17   Juline Patch, MD    Allergies as of 07/22/2017  . (No Known Allergies)    Family History  Problem Relation Age of Onset  . Hypertension Mother   . Hypertension Father   . Cancer Sister 19       peritoneal/ovarian cancer; MyRisk neg at  [facility] oncology 6/16  . Breast cancer Maternal Aunt 88    Social History   Socioeconomic History  . Marital status: Single    Spouse name: Not on file  . Number of children: Not on file  . Years of education: Not on file  . Highest education level: Not on file  Social Needs  . Financial resource strain: Not on file  . Food insecurity - worry: Not on file  . Food insecurity - inability: Not on file  . Transportation needs - medical: Not on file  . Transportation needs - non-medical: Not on file  Occupational History  . Occupation: Optician, dispensing  Tobacco Use  . Smoking status: Never Smoker  . Smokeless tobacco: Never Used  Substance and Sexual Activity  . Alcohol use: No    Alcohol/week: 0.0 oz  . Drug use: No  . Sexual activity: Yes  Other Topics Concern  . Not on file  Social History Narrative  . Not on file    Review of Systems: See HPI, otherwise negative ROS  Physical Exam: BP 132/73   Pulse 85   Temp (!) 96.7 F (35.9 C) (Tympanic)   Resp 16   Ht 5\' 2"  (1.575 m)   Wt 232 lb (105.2 kg)   SpO2 100%   BMI 42.43 kg/m  General:   Alert,  pleasant and cooperative in NAD Head:  Normocephalic and atraumatic. Neck:  Supple; no  masses or thyromegaly. Lungs:  Clear throughout to auscultation.    Heart:  Regular rate and rhythm. Abdomen:  Soft, nontender and nondistended. Normal bowel sounds, without guarding, and without rebound.   Neurologic:  Alert and  oriented x4;  grossly normal neurologically.  Impression/Plan: Shannon Blake is now here to undergo a screening colonoscopy.  Risks, benefits, and alternatives regarding colonoscopy have been reviewed with the patient.  Questions have been answered.  All parties agreeable.

## 2017-08-11 NOTE — Transfer of Care (Signed)
Immediate Anesthesia Transfer of Care Note  Patient: Shannon Blake  Procedure(s) Performed: COLONOSCOPY WITH PROPOFOL (N/A )  Patient Location: PACU and Endoscopy Unit  Anesthesia Type:General  Level of Consciousness: awake  Airway & Oxygen Therapy: Patient Spontanous Breathing  Post-op Assessment: Report given to RN  Post vital signs: stable  Last Vitals:  Vitals:   08/11/17 0743  BP: 132/73  Pulse: 85  Resp: 16  Temp: (!) 35.9 C  SpO2: 100%    Last Pain:  Vitals:   08/11/17 0743  TempSrc: Tympanic      Patients Stated Pain Goal: 6 (81/85/63 1497)  Complications: No apparent anesthesia complications

## 2017-08-11 NOTE — Anesthesia Preprocedure Evaluation (Addendum)
Anesthesia Evaluation  Patient identified by MRN, date of birth, ID band Patient awake    Reviewed: Allergy & Precautions, H&P , NPO status , Patient's Chart, lab work & pertinent test results, reviewed documented beta blocker date and time   Airway Mallampati: II  TM Distance: <3 FB Neck ROM: full    Dental  (+) Teeth Intact   Pulmonary neg pulmonary ROS,    Pulmonary exam normal        Cardiovascular hypertension, negative cardio ROS Normal cardiovascular exam Rate:Normal     Neuro/Psych negative neurological ROS  negative psych ROS   GI/Hepatic negative GI ROS, Neg liver ROS,   Endo/Other  negative endocrine ROS  Renal/GU negative Renal ROSstones  negative genitourinary   Musculoskeletal  (+) Arthritis , Osteoarthritis,    Abdominal   Peds  Hematology negative hematology ROS (+)   Anesthesia Other Findings Past Medical History:   Hypertension                                                 Arthritis                                                  Past Surgical History:   GALLBLADDER SURGERY                                           meniscal surgery                                Left              COLONOSCOPY                                      2007           Comment:Dr Oh   CHOLECYSTECTOMY                                             BMI    Body Mass Index   41.69 kg/m 2     Reproductive/Obstetrics negative OB ROS                             Anesthesia Physical  Anesthesia Plan  ASA: III  Anesthesia Plan: General   Post-op Pain Management:    Induction:   PONV Risk Score and Plan:   Airway Management Planned: Nasal Cannula  Additional Equipment:   Intra-op Plan:   Post-operative Plan:   Informed Consent: I have reviewed the patients History and Physical, chart, labs and discussed the procedure including the risks, benefits and alternatives for the proposed  anesthesia with the patient or authorized representative who has indicated his/her understanding and acceptance.   Dental Advisory Given  Plan Discussed with: CRNA  Anesthesia Plan Comments:  Anesthesia Quick Evaluation  

## 2017-08-11 NOTE — Anesthesia Postprocedure Evaluation (Signed)
Anesthesia Post Note  Patient: Shannon Blake  Procedure(s) Performed: COLONOSCOPY WITH PROPOFOL (N/A )  Patient location during evaluation: PACU Anesthesia Type: General Level of consciousness: awake and alert and oriented Pain management: pain level controlled Vital Signs Assessment: post-procedure vital signs reviewed and stable Respiratory status: spontaneous breathing Cardiovascular status: blood pressure returned to baseline Anesthetic complications: no     Last Vitals:  Vitals:   08/11/17 0940 08/11/17 0950  BP: 121/73 122/74  Pulse: 65 65  Resp: 18 16  Temp:    SpO2: 100% 100%    Last Pain:  Vitals:   08/11/17 0950  TempSrc:   PainSc: 0-No pain                 Kazumi Lachney

## 2017-08-11 NOTE — Anesthesia Post-op Follow-up Note (Signed)
Anesthesia QCDR form completed.        

## 2017-08-12 ENCOUNTER — Encounter: Payer: Self-pay | Admitting: Gastroenterology

## 2017-08-12 LAB — SURGICAL PATHOLOGY

## 2017-08-13 ENCOUNTER — Encounter: Payer: Self-pay | Admitting: Gastroenterology

## 2017-08-27 DIAGNOSIS — Z96652 Presence of left artificial knee joint: Secondary | ICD-10-CM | POA: Diagnosis not present

## 2017-09-16 DIAGNOSIS — H2513 Age-related nuclear cataract, bilateral: Secondary | ICD-10-CM | POA: Diagnosis not present

## 2017-10-22 ENCOUNTER — Ambulatory Visit
Admission: RE | Admit: 2017-10-22 | Discharge: 2017-10-22 | Disposition: A | Discharge status: Home / Self Care | Payer: BLUE CROSS/BLUE SHIELD | Source: Ambulatory Visit | Attending: Obstetrics and Gynecology | Admitting: Obstetrics and Gynecology

## 2017-10-22 DIAGNOSIS — Z1239 Encounter for other screening for malignant neoplasm of breast: Secondary | ICD-10-CM

## 2017-10-22 DIAGNOSIS — Z1231 Encounter for screening mammogram for malignant neoplasm of breast: Secondary | ICD-10-CM | POA: Diagnosis not present

## 2017-10-23 ENCOUNTER — Encounter: Payer: Self-pay | Admitting: Obstetrics and Gynecology

## 2017-12-30 ENCOUNTER — Ambulatory Visit: Payer: BLUE CROSS/BLUE SHIELD | Admitting: Family Medicine

## 2017-12-31 ENCOUNTER — Encounter: Payer: Self-pay | Admitting: Family Medicine

## 2017-12-31 ENCOUNTER — Ambulatory Visit: Payer: BLUE CROSS/BLUE SHIELD | Admitting: Family Medicine

## 2017-12-31 VITALS — BP 117/76 | HR 78 | Resp 16 | Ht 62.0 in | Wt 237.0 lb

## 2017-12-31 DIAGNOSIS — I10 Essential (primary) hypertension: Secondary | ICD-10-CM

## 2017-12-31 DIAGNOSIS — E7800 Pure hypercholesterolemia, unspecified: Secondary | ICD-10-CM

## 2017-12-31 MED ORDER — AMLODIPINE BESY-BENAZEPRIL HCL 5-10 MG PO CAPS: 1.0000 | ORAL_CAPSULE | Freq: Every day | ORAL | 1 refills | Status: DC

## 2017-12-31 NOTE — Progress Notes (Signed)
Name: Shannon Blake   MRN: [id number]    DOB: 1955-12-27   Date:12/31/2017       Progress Note  Subjective  Chief Complaint  Chief Complaint  Patient presents with  . Hypertension    Hypertension  This is a chronic problem. The current episode started more than 1 year ago. The problem is unchanged. The problem is controlled. Pertinent negatives include no anxiety, blurred vision, chest pain, headaches, malaise/fatigue, neck pain, orthopnea, palpitations, peripheral edema, PND, shortness of breath or sweats. There are no associated agents to hypertension. Risk factors for coronary artery disease include dyslipidemia, obesity and post-menopausal state. Past treatments include ACE inhibitors and calcium channel blockers. The current treatment provides moderate improvement. There are no compliance problems.  There is no history of angina, kidney disease, CAD/MI, CVA, heart failure, left ventricular hypertrophy, PVD or retinopathy. There is no history of chronic renal disease, a hypertension causing med or renovascular disease.    Essential (primary) hypertension Chronic Controlled Continue Amlodipine-Benazepril 10-5 mg daily. Will check renal panel.   Past Medical History:  Diagnosis Date  . Arthritis   . Family history of ovarian cancer    Pt's affected sister is MyRisk neg  . Hypertension     Past Surgical History:  Procedure Laterality Date  . CHOLECYSTECTOMY    . COLONOSCOPY  2007   Dr Candace Cruise  . COLONOSCOPY WITH PROPOFOL N/A 08/11/2017   Procedure: COLONOSCOPY WITH PROPOFOL;  Surgeon: Lucilla Lame, MD;  Location: [facility] ENDOSCOPY;  Service: Endoscopy;  Laterality: N/A;  . GALLBLADDER SURGERY    . KNEE ARTHROPLASTY Left 08/27/2015   Procedure: COMPUTER ASSISTED TOTAL KNEE ARTHROPLASTY;  Surgeon: Dereck Leep, MD;  Location: [facility] ORS;  Service: Orthopedics;  Laterality: Left;  . meniscal surgery Left     Family History  Problem Relation Age of Onset  . Hypertension Mother   .  Hypertension Father   . Cancer Sister 106       peritoneal/ovarian cancer; MyRisk neg at [facility] oncology 6/16  . Breast cancer Maternal Aunt 88    Social History   Socioeconomic History  . Marital status: Single    Spouse name: Not on file  . Number of children: Not on file  . Years of education: Not on file  . Highest education level: Not on file  Occupational History  . Occupation: Optician, dispensing  Social Needs  . Financial resource strain: Not on file  . Food insecurity:    Worry: Not on file    Inability: Not on file  . Transportation needs:    Medical: Not on file    Non-medical: Not on file  Tobacco Use  . Smoking status: Never Smoker  . Smokeless tobacco: Never Used  Substance and Sexual Activity  . Alcohol use: No    Alcohol/week: 0.0 oz  . Drug use: No  . Sexual activity: Yes  Lifestyle  . Physical activity:    Days per week: Not on file    Minutes per session: Not on file  . Stress: Not on file  Relationships  . Social connections:    Talks on phone: Not on file    Gets together: Not on file    Attends religious service: Not on file    Active member of club or organization: Not on file    Attends meetings of clubs or organizations: Not on file    Relationship status: Not on file  . Intimate partner violence:    Fear of current  or ex partner: Not on file    Emotionally abused: Not on file    Physically abused: Not on file    Forced sexual activity: Not on file  Other Topics Concern  . Not on file  Social History Narrative  . Not on file    No Known Allergies  Outpatient Medications Prior to Visit  Medication Sig Dispense Refill  . aspirin 81 MG chewable tablet Chew 81 mg by mouth daily.     . Calcium Carb-Cholecalciferol (CALCIUM 600 + D PO) Take 1 tablet by mouth daily.    . Multiple Vitamins-Minerals (CENTRUM SILVER ULTRA WOMENS) TABS Take 1 tablet by mouth daily.    Marland Kitchen amLODipine-benazepril (LOTREL) 5-10 MG capsule Take 1 capsule by mouth daily. 90  capsule 1  . nitrofurantoin, macrocrystal-monohydrate, (MACROBID) 100 MG capsule Take 1 capsule (100 mg total) by mouth 2 (two) times daily. (Patient not taking: Reported on 08/11/2017) 10 capsule 0  . nystatin cream (MYCOSTATIN) Apply 1 application topically 2 (two) times daily. (Patient not taking: Reported on 08/11/2017) 30 g 0   No facility-administered medications prior to visit.     Review of Systems  Constitutional: Negative for chills, fever, malaise/fatigue and weight loss.  HENT: Negative for ear discharge, ear pain and sore throat.   Eyes: Negative for blurred vision.  Respiratory: Negative for cough, sputum production, shortness of breath and wheezing.   Cardiovascular: Negative for chest pain, palpitations, orthopnea, leg swelling and PND.  Gastrointestinal: Negative for abdominal pain, blood in stool, constipation, diarrhea, heartburn, melena and nausea.  Genitourinary: Negative for dysuria, frequency, hematuria and urgency.  Musculoskeletal: Negative for back pain, joint pain, myalgias and neck pain.  Skin: Negative for rash.  Neurological: Negative for dizziness, tingling, sensory change, focal weakness and headaches.  Endo/Heme/Allergies: Negative for environmental allergies and polydipsia. Does not bruise/bleed easily.  Psychiatric/Behavioral: Negative for depression and suicidal ideas. The patient is not nervous/anxious and does not have insomnia.      Objective  Vitals:   12/31/17 0805  BP: 117/76  Pulse: 78  Resp: 16  SpO2: 99%  Weight: 237 lb (107.5 kg)  Height: 5\' 2"  (1.575 m)    Physical Exam  Constitutional: She appears well-developed and well-nourished. No distress.  HENT:  Head: Normocephalic and atraumatic.  Right Ear: External ear normal.  Left Ear: External ear normal.  Nose: Nose normal.  Mouth/Throat: Oropharynx is clear and moist.  Eyes: Pupils are equal, round, and reactive to light. Conjunctivae and EOM are normal. Right eye exhibits no  discharge. Left eye exhibits no discharge.  Neck: Normal range of motion. Neck supple. No JVD present. No thyromegaly present.  Cardiovascular: Normal rate, regular rhythm, S1 normal, S2 normal, normal heart sounds and intact distal pulses. PMI is not displaced. Exam reveals no gallop, no S3, no S4 and no friction rub.  No murmur heard.  No systolic murmur is present.  No diastolic murmur is present. Pulmonary/Chest: Effort normal and breath sounds normal. She has no wheezes. She has no rales.  Abdominal: Soft. Bowel sounds are normal. She exhibits no mass. There is no tenderness. There is no guarding.  Musculoskeletal: Normal range of motion. She exhibits no edema.  Lymphadenopathy:    She has no cervical adenopathy.  Neurological: She is alert. She has normal reflexes.  Skin: Skin is warm and dry. She is not diaphoretic.  Nursing note and vitals reviewed.     Assessment & Plan  Problem List Items Addressed This Visit  Cardiovascular and Mediastinum   Essential (primary) hypertension - Primary    Chronic Controlled Continue Amlodipine-Benazepril 10-5 mg daily. Will check renal panel.      Relevant Medications   amLODipine-benazepril (LOTREL) 5-10 MG capsule   Other Relevant Orders   Renal function panel    Other Visit Diagnoses    Obesity, Class III, BMI 40-49.9 (morbid obesity) ([place])       Exercise and diet discussed    Relevant Orders   Lipid panel   Pure hypercholesterolemia       Elevated LDL in past will check lipid panel. low callorie diet provided.   Relevant Medications   amLODipine-benazepril (LOTREL) 5-10 MG capsule      Meds ordered this encounter  Medications  . amLODipine-benazepril (LOTREL) 5-10 MG capsule    Sig: Take 1 capsule by mouth daily.    Dispense:  90 capsule    Refill:  1   Health risks of being over weight were discussed and patient was counseled on weight loss options and exercise.    Dr. Macon [facility]  12/31/17

## 2017-12-31 NOTE — Patient Instructions (Signed)

## 2017-12-31 NOTE — Assessment & Plan Note (Signed)
Chronic Controlled Continue Amlodipine-Benazepril 10-5 mg daily. Will check renal panel.

## 2018-01-01 LAB — LIPID PANEL
CHOLESTEROL TOTAL: 175 mg/dL (ref 100–199)
Chol/HDL Ratio: 2.9 ratio (ref 0.0–4.4)
HDL: 60 mg/dL (ref >39)
LDL CALC: 102 mg/dL — AB (ref 0–99)
TRIGLYCERIDES: 65 mg/dL (ref 0–149)
VLDL CHOLESTEROL CAL: 13 mg/dL (ref 5–40)

## 2018-01-01 LAB — RENAL FUNCTION PANEL
Albumin: 4.5 g/dL (ref 3.6–4.8)
BUN / CREAT RATIO: 25 (ref 12–28)
BUN: 20 mg/dL (ref 8–27)
CALCIUM: 10 mg/dL (ref 8.7–10.3)
CO2: 24 mmol/L (ref 20–29)
CREATININE: 0.8 mg/dL (ref 0.57–1.00)
Chloride: 101 mmol/L (ref 96–106)
GFR calc Af Amer: 91 mL/min/{1.73_m2} (ref >59)
GFR calc non Af Amer: 79 mL/min/{1.73_m2} (ref >59)
Glucose: 88 mg/dL (ref 65–99)
PHOSPHORUS: 3.9 mg/dL (ref 2.5–4.5)
Potassium: 4.9 mmol/L (ref 3.5–5.2)
Sodium: 141 mmol/L (ref 134–144)

## 2018-07-01 ENCOUNTER — Other Ambulatory Visit: Payer: Self-pay | Admitting: Family Medicine

## 2018-07-01 DIAGNOSIS — I10 Essential (primary) hypertension: Secondary | ICD-10-CM

## 2018-07-05 ENCOUNTER — Ambulatory Visit: Payer: BLUE CROSS/BLUE SHIELD | Admitting: Family Medicine

## 2018-07-05 ENCOUNTER — Encounter: Payer: Self-pay | Admitting: Family Medicine

## 2018-07-05 VITALS — BP 110/62 | HR 64 | Ht 62.0 in | Wt 236.0 lb

## 2018-07-05 DIAGNOSIS — Z6841 Body Mass Index (BMI) 40.0 and over, adult: Secondary | ICD-10-CM

## 2018-07-05 DIAGNOSIS — I10 Essential (primary) hypertension: Secondary | ICD-10-CM

## 2018-07-05 MED ORDER — AMLODIPINE BESY-BENAZEPRIL HCL 5-10 MG PO CAPS: ORAL_CAPSULE | ORAL | 1 refills | Status: DC

## 2018-07-05 NOTE — Progress Notes (Signed)
Date:  07/05/2018   Name:  Shannon Blake   DOB:  1956-01-04   MRN:  [id number]   Chief Complaint: Hypertension  Hypertension  This is a chronic problem. The current episode started more than 1 year ago. The problem is unchanged. The problem is controlled. Pertinent negatives include no anxiety, blurred vision, chest pain, headaches, malaise/fatigue, neck pain, orthopnea, palpitations, peripheral edema, PND, shortness of breath or sweats. There are no associated agents to hypertension. There are no known risk factors for coronary artery disease. Past treatments include ACE inhibitors and calcium channel blockers. The current treatment provides moderate improvement. There are no compliance problems.  There is no history of angina, kidney disease, CAD/MI, CVA, heart failure, left ventricular hypertrophy, PVD or retinopathy. There is no history of chronic renal disease, a hypertension causing med or renovascular disease.    Review of Systems  Constitutional: Negative.  Negative for chills, fatigue, fever, malaise/fatigue and unexpected weight change.  HENT: Negative for congestion, ear discharge, ear pain, rhinorrhea, sinus pressure, sneezing and sore throat.   Eyes: Negative for blurred vision, photophobia, pain, discharge, redness and itching.  Respiratory: Negative for cough, shortness of breath, wheezing and stridor.   Cardiovascular: Negative for chest pain, palpitations, orthopnea and PND.  Gastrointestinal: Negative for abdominal pain, blood in stool, constipation, diarrhea, nausea and vomiting.  Endocrine: Negative for cold intolerance, heat intolerance, polydipsia, polyphagia and polyuria.  Genitourinary: Negative for dysuria, flank pain, frequency, hematuria, menstrual problem, pelvic pain, urgency, vaginal bleeding and vaginal discharge.  Musculoskeletal: Negative for arthralgias, back pain, myalgias and neck pain.  Skin: Negative for rash.  Allergic/Immunologic: Negative for  environmental allergies and food allergies.  Neurological: Negative for dizziness, weakness, light-headedness, numbness and headaches.  Hematological: Negative for adenopathy. Does not bruise/bleed easily.  Psychiatric/Behavioral: Negative for dysphoric mood. The patient is not nervous/anxious.     Patient Active Problem List   Diagnosis Date Noted  . Special screening for malignant neoplasms, colon   . Polyp of sigmoid colon   . Family history of peritoneal cancer 07/20/2017  . S/P total knee arthroplasty 08/27/2015  . Calculus of kidney 10/17/2014  . Alimentary obesity 10/17/2014  . Cough 10/17/2014  . Essential (primary) hypertension 10/17/2014  . Combined fat and carbohydrate induced hyperlipemia 10/17/2014  . Derangement of knee 12/01/2013  . Arthritis of knee, degenerative 12/01/2013    No Known Allergies  Past Surgical History:  Procedure Laterality Date  . CHOLECYSTECTOMY    . COLONOSCOPY  2007   Dr Candace Cruise  . COLONOSCOPY WITH PROPOFOL N/A 08/11/2017   Procedure: COLONOSCOPY WITH PROPOFOL;  Surgeon: Lucilla Lame, MD;  Location: [facility] ENDOSCOPY;  Service: Endoscopy;  Laterality: N/A;  . GALLBLADDER SURGERY    . KNEE ARTHROPLASTY Left 08/27/2015   Procedure: COMPUTER ASSISTED TOTAL KNEE ARTHROPLASTY;  Surgeon: Dereck Leep, MD;  Location: [facility] ORS;  Service: Orthopedics;  Laterality: Left;  . meniscal surgery Left     Social History   Tobacco Use  . Smoking status: Never Smoker  . Smokeless tobacco: Never Used  Substance Use Topics  . Alcohol use: No    Alcohol/week: 0.0 standard drinks  . Drug use: No     Medication list has been reviewed and updated.  Current Meds  Medication Sig  . amLODipine-benazepril (LOTREL) 5-10 MG capsule TAKE 1 CAPSULE BY MOUTH EVERY DAY  . aspirin 81 MG chewable tablet Chew 81 mg by mouth daily.   . Calcium Carb-Cholecalciferol (CALCIUM 600 + D PO) Take  1 tablet by mouth daily.  . Multiple Vitamins-Minerals (CENTRUM SILVER ULTRA  WOMENS) TABS Take 1 tablet by mouth daily.    PHQ 2/9 Scores 05/04/2017 08/01/2015 07/02/2015 05/21/2015  PHQ - 2 Score 0 0 0 0    Physical Exam Vitals signs and nursing note reviewed.  Constitutional:      General: She is not in acute distress.    Appearance: She is not diaphoretic.  HENT:     Head: Normocephalic and atraumatic.     Right Ear: External ear normal.     Left Ear: External ear normal.     Nose: Nose normal.  Eyes:     General:        Right eye: No discharge.        Left eye: No discharge.     Conjunctiva/sclera: Conjunctivae normal.     Pupils: Pupils are equal, round, and reactive to light.  Neck:     Musculoskeletal: Normal range of motion and neck supple.     Thyroid: No thyromegaly.     Vascular: No JVD.  Cardiovascular:     Rate and Rhythm: Normal rate and regular rhythm.     Heart sounds: Normal heart sounds. No murmur. No friction rub. No gallop.   Pulmonary:     Effort: Pulmonary effort is normal.     Breath sounds: Normal breath sounds.  Abdominal:     General: Bowel sounds are normal.     Palpations: Abdomen is soft. There is no mass.     Tenderness: There is no abdominal tenderness. There is no guarding.  Musculoskeletal: Normal range of motion.  Lymphadenopathy:     Cervical: No cervical adenopathy.  Skin:    General: Skin is warm and dry.  Neurological:     Mental Status: She is alert.     Deep Tendon Reflexes: Reflexes are normal and symmetric.     BP 110/62   Pulse 64   Ht 5\' 2"  (1.575 m)   Wt 236 lb (107 kg)   BMI 43.16 kg/m   Assessment and Plan: 1. BMI 40.0-44.9, adult [facility]) Health risks of being over weight were discussed and patient was counseled on weight loss options and exercise.  Patient was given a DASH diet for control of blood pressure as well as weight reduction.  2. Essential (primary) hypertension Chronic.  Controlled.  Continue amlodipine benazepril 5-10 mg daily.  Reviewed last renal panel and will repeat in 6  months. - amLODipine-benazepril (LOTREL) 5-10 MG capsule; TAKE 1 CAPSULE BY MOUTH EVERY DAY  Dispense: 90 capsule; Refill: 1

## 2018-07-05 NOTE — Patient Instructions (Signed)
DASH Eating Plan  DASH stands for "Dietary Approaches to Stop Hypertension." The DASH eating plan is a healthy eating plan that has been shown to reduce high blood pressure (hypertension). It may also reduce your risk for type 2 diabetes, heart disease, and stroke. The DASH eating plan may also help with weight loss.  What are tips for following this plan?    General guidelines   Avoid eating more than 2,300 mg (milligrams) of salt (sodium) a day. If you have hypertension, you may need to reduce your sodium intake to 1,500 mg a day.   Limit alcohol intake to no more than 1 drink a day for nonpregnant women and 2 drinks a day for men. One drink equals 12 oz of beer, 5 oz of wine, or 1 oz of hard liquor.   Work with your health care provider to maintain a healthy body weight or to lose weight. Ask what an ideal weight is for you.   Get at least 30 minutes of exercise that causes your heart to beat faster (aerobic exercise) most days of the week. Activities may include walking, swimming, or biking.   Work with your health care provider or diet and nutrition specialist (dietitian) to adjust your eating plan to your individual calorie needs.  Reading food labels     Check food labels for the amount of sodium per serving. Choose foods with less than 5 percent of the Daily Value of sodium. Generally, foods with less than 300 mg of sodium per serving fit into this eating plan.   To find whole grains, look for the word "whole" as the first word in the ingredient list.  Shopping   Buy products labeled as "low-sodium" or "no salt added."   Buy fresh foods. Avoid canned foods and premade or frozen meals.  Cooking   Avoid adding salt when cooking. Use salt-free seasonings or herbs instead of table salt or sea salt. Check with your health care provider or pharmacist before using salt substitutes.   Do not fry foods. Cook foods using healthy methods such as baking, boiling, grilling, and broiling instead.   Cook with  heart-healthy oils, such as olive, canola, soybean, or sunflower oil.  Meal planning   Eat a balanced diet that includes:  ? 5 or more servings of fruits and vegetables each day. At each meal, try to fill half of your plate with fruits and vegetables.  ? Up to 6-8 servings of whole grains each day.  ? Less than 6 oz of lean meat, poultry, or fish each day. A 3-oz serving of meat is about the same size as a deck of cards. One egg equals 1 oz.  ? 2 servings of low-fat dairy each day.  ? A serving of nuts, seeds, or beans 5 times each week.  ? Heart-healthy fats. Healthy fats called Omega-3 fatty acids are found in foods such as flaxseeds and coldwater fish, like sardines, salmon, and mackerel.   Limit how much you eat of the following:  ? Canned or prepackaged foods.  ? Food that is high in trans fat, such as fried foods.  ? Food that is high in saturated fat, such as fatty meat.  ? Sweets, desserts, sugary drinks, and other foods with added sugar.  ? Full-fat dairy products.   Do not salt foods before eating.   Try to eat at least 2 vegetarian meals each week.   Eat more home-cooked food and less restaurant, buffet, and fast food.     When eating at a restaurant, ask that your food be prepared with less salt or no salt, if possible.  What foods are recommended?  The items listed may not be a complete list. Talk with your dietitian about what dietary choices are best for you.  Grains  Whole-grain or whole-wheat bread. Whole-grain or whole-wheat pasta. Brown rice. Oatmeal. Quinoa. Bulgur. Whole-grain and low-sodium cereals. Pita bread. Low-fat, low-sodium crackers. Whole-wheat flour tortillas.  Vegetables  Fresh or frozen vegetables (raw, steamed, roasted, or grilled). Low-sodium or reduced-sodium tomato and vegetable juice. Low-sodium or reduced-sodium tomato sauce and tomato paste. Low-sodium or reduced-sodium canned vegetables.  Fruits  All fresh, dried, or frozen fruit. Canned fruit in natural juice (without  added sugar).  Meat and other protein foods  Skinless chicken or turkey. Ground chicken or turkey. Pork with fat trimmed off. Fish and seafood. Egg whites. Dried beans, peas, or lentils. Unsalted nuts, nut butters, and seeds. Unsalted canned beans. Lean cuts of beef with fat trimmed off. Low-sodium, lean deli meat.  Dairy  Low-fat (1%) or fat-free (skim) milk. Fat-free, low-fat, or reduced-fat cheeses. Nonfat, low-sodium ricotta or cottage cheese. Low-fat or nonfat yogurt. Low-fat, low-sodium cheese.  Fats and oils  Soft margarine without trans fats. Vegetable oil. Low-fat, reduced-fat, or light mayonnaise and salad dressings (reduced-sodium). Canola, safflower, olive, soybean, and sunflower oils. Avocado.  Seasoning and other foods  Herbs. Spices. Seasoning mixes without salt. Unsalted popcorn and pretzels. Fat-free sweets.  What foods are not recommended?  The items listed may not be a complete list. Talk with your dietitian about what dietary choices are best for you.  Grains  Baked goods made with fat, such as croissants, muffins, or some breads. Dry pasta or rice meal packs.  Vegetables  Creamed or fried vegetables. Vegetables in a cheese sauce. Regular canned vegetables (not low-sodium or reduced-sodium). Regular canned tomato sauce and paste (not low-sodium or reduced-sodium). Regular tomato and vegetable juice (not low-sodium or reduced-sodium). Pickles. Olives.  Fruits  Canned fruit in a light or heavy syrup. Fried fruit. Fruit in cream or butter sauce.  Meat and other protein foods  Fatty cuts of meat. Ribs. Fried meat. Bacon. Sausage. Bologna and other processed lunch meats. Salami. Fatback. Hotdogs. Bratwurst. Salted nuts and seeds. Canned beans with added salt. Canned or smoked fish. Whole eggs or egg yolks. Chicken or turkey with skin.  Dairy  Whole or 2% milk, cream, and half-and-half. Whole or full-fat cream cheese. Whole-fat or sweetened yogurt. Full-fat cheese. Nondairy creamers. Whipped toppings.  Processed cheese and cheese spreads.  Fats and oils  Butter. Stick margarine. Lard. Shortening. Ghee. Bacon fat. Tropical oils, such as coconut, palm kernel, or palm oil.  Seasoning and other foods  Salted popcorn and pretzels. Onion salt, garlic salt, seasoned salt, table salt, and sea salt. Worcestershire sauce. Tartar sauce. Barbecue sauce. Teriyaki sauce. Soy sauce, including reduced-sodium. Steak sauce. Canned and packaged gravies. Fish sauce. Oyster sauce. Cocktail sauce. Horseradish that you find on the shelf. Ketchup. Mustard. Meat flavorings and tenderizers. Bouillon cubes. Hot sauce and Tabasco sauce. Premade or packaged marinades. Premade or packaged taco seasonings. Relishes. Regular salad dressings.  Where to find more information:   National Heart, Lung, and Blood Institute: www.nhlbi.nih.gov   American Heart Association: www.heart.org  Summary   The DASH eating plan is a healthy eating plan that has been shown to reduce high blood pressure (hypertension). It may also reduce your risk for type 2 diabetes, heart disease, and stroke.   With the   DASH eating plan, you should limit salt (sodium) intake to 2,300 mg a day. If you have hypertension, you may need to reduce your sodium intake to 1,500 mg a day.   When on the DASH eating plan, aim to eat more fresh fruits and vegetables, whole grains, lean proteins, low-fat dairy, and heart-healthy fats.   Work with your health care provider or diet and nutrition specialist (dietitian) to adjust your eating plan to your individual calorie needs.  This information is not intended to replace advice given to you by your health care provider. Make sure you discuss any questions you have with your health care provider.  Document Released: 05/15/2011 Document Revised: 05/19/2016 Document Reviewed: 05/19/2016  Elsevier Interactive Patient Education  2019 Elsevier Inc.

## 2018-07-21 NOTE — Patient Instructions (Addendum)
I value your feedback and entrusting us with your care. If you get a [facility] patient survey, I would appreciate you taking the time to let us know about your experience today. Thank you!  [facility]: [phone]    

## 2018-07-21 NOTE — Progress Notes (Signed)
PCP: Juline Patch, MD   Chief Complaint  Patient presents with  . Gynecologic Exam    HPI:      [name] is a 63 y.o. G1P1001 who LMP was No LMP recorded. Patient is postmenopausal., presents today for her annual examination.  Her menses are absent due to menopause.  She does not have intermenstrual bleeding. She does not have vasomotor sx.   Sex activity: not sexually active. She does not have vaginal dryness.  Last Pap: July 17, 2015  Results were: no abnormalities /neg HPV DNA.  Hx of STDs: none  Last mammogram: 10/22/17 Results were: normal--routine follow-up in 12 months There is a FH of breast cancer in her mat aunt. There is a FH of ovarian/peritoneal cancer in her sister. Sister is MyRisk neg through [facility] 6/16. The patient does do self-breast exams.  Colonoscopy: colonoscopy 2019 without abnormalities.  Repeat due after 10 years.  Tobacco use: The patient denies current or previous tobacco use. Alcohol use: none Exercise: moderately active  She does get adequate calcium and Vitamin D in her diet.  Labs with PCP.   Past Medical History:  Diagnosis Date  . Arthritis   . Family history of ovarian cancer    Pt's affected sister is MyRisk neg  . Hypertension     Past Surgical History:  Procedure Laterality Date  . CHOLECYSTECTOMY    . COLONOSCOPY  2007   Dr Candace Cruise  . COLONOSCOPY WITH PROPOFOL N/A 08/11/2017   Procedure: COLONOSCOPY WITH PROPOFOL;  Surgeon: Lucilla Lame, MD;  Location: [facility] ENDOSCOPY;  Service: Endoscopy;  Laterality: N/A;  . GALLBLADDER SURGERY    . KNEE ARTHROPLASTY Left 08/27/2015   Procedure: COMPUTER ASSISTED TOTAL KNEE ARTHROPLASTY;  Surgeon: Dereck Leep, MD;  Location: [facility] ORS;  Service: Orthopedics;  Laterality: Left;  . meniscal surgery Left     Family History  Problem Relation Age of Onset  . Hypertension Mother   . Hypertension Father   . Cancer Sister 43       peritoneal/ovarian cancer; MyRisk neg at [facility] oncology  6/16  . Breast cancer Maternal Aunt 88  . Thyroid cancer Maternal Aunt        gland    Social History   Socioeconomic History  . Marital status: Single    Spouse name: Not on file  . Number of children: Not on file  . Years of education: Not on file  . Highest education level: Not on file  Occupational History  . Occupation: Optician, dispensing  Social Needs  . Financial resource strain: Not on file  . Food insecurity:    Worry: Not on file    Inability: Not on file  . Transportation needs:    Medical: Not on file    Non-medical: Not on file  Tobacco Use  . Smoking status: Never Smoker  . Smokeless tobacco: Never Used  Substance and Sexual Activity  . Alcohol use: No    Alcohol/week: 0.0 standard drinks  . Drug use: No  . Sexual activity: Yes    Birth control/protection: Post-menopausal  Lifestyle  . Physical activity:    Days per week: Not on file    Minutes per session: Not on file  . Stress: Not on file  Relationships  . Social connections:    Talks on phone: Not on file    Gets together: Not on file    Attends religious service: Not on file    Active member of  club or organization: Not on file    Attends meetings of clubs or organizations: Not on file    Relationship status: Not on file  . Intimate partner violence:    Fear of current or ex partner: Not on file    Emotionally abused: Not on file    Physically abused: Not on file    Forced sexual activity: Not on file  Other Topics Concern  . Not on file  Social History Narrative  . Not on file    Current Meds  Medication Sig  . amLODipine-benazepril (LOTREL) 5-10 MG capsule TAKE 1 CAPSULE BY MOUTH EVERY DAY  . aspirin 81 MG chewable tablet Chew 81 mg by mouth daily.   . Calcium Carb-Cholecalciferol (CALCIUM 600 + D PO) Take 1 tablet by mouth daily.  . Multiple Vitamins-Minerals (CENTRUM SILVER ULTRA WOMENS) TABS Take 1 tablet by mouth daily.      ROS:  Review of Systems  Constitutional: Negative for  fatigue, fever and unexpected weight change.  Respiratory: Negative for cough, shortness of breath and wheezing.   Cardiovascular: Negative for chest pain, palpitations and leg swelling.  Gastrointestinal: Negative for blood in stool, constipation, diarrhea, nausea and vomiting.  Endocrine: Negative for cold intolerance, heat intolerance and polyuria.  Genitourinary: Negative for dyspareunia, dysuria, flank pain, frequency, genital sores, hematuria, menstrual problem, pelvic pain, urgency, vaginal bleeding, vaginal discharge and vaginal pain.  Musculoskeletal: Negative for back pain, joint swelling and myalgias.  Skin: Negative for rash.  Neurological: Negative for dizziness, syncope, light-headedness, numbness and headaches.  Hematological: Negative for adenopathy.  Psychiatric/Behavioral: Negative for agitation, confusion, sleep disturbance and suicidal ideas. The patient is not nervous/anxious.      Objective: BP (!) 142/84   Pulse 69   Ht 5\' 2"  (1.575 m)   Wt 239 lb (108.4 kg)   BMI 43.71 kg/m    Physical Exam Constitutional:      Appearance: She is well-developed.  Genitourinary:     Vulva, vagina and uterus normal.     No vaginal discharge, erythema or tenderness.     No cervical motion tenderness or polyp.     Uterus is not enlarged or tender.     No right or left adnexal mass present.     Right adnexa not tender.     Left adnexa not tender.  Neck:     Musculoskeletal: Normal range of motion.     Thyroid: No thyromegaly.  Cardiovascular:     Rate and Rhythm: Normal rate and regular rhythm.     Heart sounds: Normal heart sounds. No murmur.  Pulmonary:     Effort: Pulmonary effort is normal.     Breath sounds: Normal breath sounds.  Chest:     Breasts:        Right: No mass, nipple discharge, skin change or tenderness.        Left: No mass, nipple discharge, skin change or tenderness.  Abdominal:     Palpations: Abdomen is soft.     Tenderness: There is no  abdominal tenderness. There is no guarding.  Musculoskeletal: Normal range of motion.  Neurological:     Mental Status: She is alert and oriented to person, place, and time.     Cranial Nerves: No cranial nerve deficit.  Psychiatric:        Behavior: Behavior normal.  Vitals signs reviewed.     Assessment/Plan:  Encounter for annual routine gynecological examination  Cervical cancer screening - Plan: Cytology - PAP  Screening for HPV (human papillomavirus) - Plan: Cytology - PAP  Screening for breast cancer - Pt to sched mammo - Plan: MM 3D SCREEN BREAST BILATERAL         GYN counsel breast self exam, mammography screening, menopause, adequate intake of calcium and vitamin D, diet and exercise    F/U  Return in about 1 year (around 07/23/2019).  Alicia B. Copland, PA-C 07/22/2018 10:41 AM

## 2018-07-22 ENCOUNTER — Ambulatory Visit (INDEPENDENT_AMBULATORY_CARE_PROVIDER_SITE_OTHER): Payer: BLUE CROSS/BLUE SHIELD | Admitting: Obstetrics and Gynecology

## 2018-07-22 ENCOUNTER — Other Ambulatory Visit (HOSPITAL_COMMUNITY)
Admission: RE | Admit: 2018-07-22 | Discharge: 2018-07-22 | Disposition: A | Discharge status: Home / Self Care | Payer: BLUE CROSS/BLUE SHIELD | Source: Ambulatory Visit | Attending: Obstetrics and Gynecology | Admitting: Obstetrics and Gynecology

## 2018-07-22 ENCOUNTER — Encounter: Payer: Self-pay | Admitting: Obstetrics and Gynecology

## 2018-07-22 VITALS — BP 142/84 | HR 69 | Ht 62.0 in | Wt 239.0 lb

## 2018-07-22 DIAGNOSIS — Z01419 Encounter for gynecological examination (general) (routine) without abnormal findings: Secondary | ICD-10-CM

## 2018-07-22 DIAGNOSIS — Z124 Encounter for screening for malignant neoplasm of cervix: Secondary | ICD-10-CM | POA: Diagnosis not present

## 2018-07-22 DIAGNOSIS — Z1151 Encounter for screening for human papillomavirus (HPV): Secondary | ICD-10-CM | POA: Insufficient documentation

## 2018-07-22 DIAGNOSIS — Z1239 Encounter for other screening for malignant neoplasm of breast: Secondary | ICD-10-CM

## 2018-07-23 LAB — CYTOLOGY - PAP
DIAGNOSIS: NEGATIVE
HPV (WINDOPATH): NOT DETECTED

## 2018-08-20 ENCOUNTER — Telehealth: Payer: Self-pay

## 2018-08-20 NOTE — Telephone Encounter (Signed)
Pt saw ABC 2/13 for annual; has received bill from [facility]; her annuals aare covered 100%.  Was it coded correctly?  Will you resubmit?  Please call.  [phone]

## 2018-08-27 ENCOUNTER — Encounter: Payer: Self-pay | Admitting: Family Medicine

## 2018-08-27 ENCOUNTER — Other Ambulatory Visit: Payer: Self-pay

## 2018-08-27 ENCOUNTER — Ambulatory Visit: Payer: BLUE CROSS/BLUE SHIELD | Admitting: Family Medicine

## 2018-08-27 VITALS — BP 120/70 | HR 80 | Temp 98.4°F | Ht 62.0 in | Wt 235.0 lb

## 2018-08-27 DIAGNOSIS — J0100 Acute maxillary sinusitis, unspecified: Secondary | ICD-10-CM

## 2018-08-27 MED ORDER — AMOXICILLIN 500 MG PO CAPS: 500.0000 mg | ORAL_CAPSULE | Freq: Three times a day (TID) | ORAL | 0 refills | Status: DC

## 2018-08-27 NOTE — Progress Notes (Signed)
Date:  08/27/2018   Name:  Shannon Blake   DOB:  1955/12/13   MRN:  [id number]   Chief Complaint: Sore Throat (cough, drainage from ears down into throat. )  Sore Throat   This is a new problem. The current episode started in the past 7 days (2 days ). The problem has been gradually worsening. There has been no fever. The pain is at a severity of 8/10. The pain is moderate. Associated symptoms include congestion and ear pain. Pertinent negatives include no abdominal pain, coughing, diarrhea, drooling, ear discharge, headaches, hoarse voice, plugged ear sensation, neck pain, shortness of breath, stridor, swollen glands, trouble swallowing or vomiting. Associated symptoms comments: otalgia. She has had no exposure to strep or mono. She has tried cool liquids and acetaminophen (cough drops) for the symptoms. The treatment provided mild relief.  Sinusitis  This is a new problem. The current episode started in the past 7 days. The problem has been gradually worsening since onset. There has been no fever. Associated symptoms include chills, congestion, ear pain, sinus pressure, sneezing and a sore throat. Pertinent negatives include no coughing, diaphoresis, headaches, hoarse voice, neck pain, shortness of breath or swollen glands.    Review of Systems  Constitutional: Positive for chills. Negative for diaphoresis, fatigue, fever and unexpected weight change.  HENT: Positive for congestion, ear pain, postnasal drip, sinus pressure, sneezing and sore throat. Negative for drooling, ear discharge, hoarse voice, mouth sores, nosebleeds, rhinorrhea and trouble swallowing.   Eyes: Negative for photophobia, pain, discharge, redness and itching.  Respiratory: Negative for cough, chest tightness, shortness of breath, wheezing and stridor.   Cardiovascular: Negative for chest pain, palpitations and leg swelling.  Gastrointestinal: Negative for abdominal pain, blood in stool, constipation, diarrhea, nausea and  vomiting.  Endocrine: Negative for cold intolerance, heat intolerance, polydipsia, polyphagia and polyuria.  Genitourinary: Negative for dysuria, flank pain, frequency, hematuria, menstrual problem, pelvic pain, urgency, vaginal bleeding and vaginal discharge.  Musculoskeletal: Negative for arthralgias, back pain, myalgias and neck pain.  Skin: Negative for rash.  Allergic/Immunologic: Negative for environmental allergies and food allergies.  Neurological: Negative for dizziness, weakness, light-headedness, numbness and headaches.  Hematological: Negative for adenopathy. Does not bruise/bleed easily.  Psychiatric/Behavioral: Negative for dysphoric mood. The patient is not nervous/anxious.     Patient Active Problem List   Diagnosis Date Noted  . Special screening for malignant neoplasms, colon   . Polyp of sigmoid colon   . Family history of peritoneal cancer 07/20/2017  . S/P total knee arthroplasty 08/27/2015  . Calculus of kidney 10/17/2014  . Alimentary obesity 10/17/2014  . Cough 10/17/2014  . Essential (primary) hypertension 10/17/2014  . Combined fat and carbohydrate induced hyperlipemia 10/17/2014  . Derangement of knee 12/01/2013  . Arthritis of knee, degenerative 12/01/2013    No Known Allergies  Past Surgical History:  Procedure Laterality Date  . CHOLECYSTECTOMY    . COLONOSCOPY  2007   Dr Candace Cruise  . COLONOSCOPY WITH PROPOFOL N/A 08/11/2017   Procedure: COLONOSCOPY WITH PROPOFOL;  Surgeon: Lucilla Lame, MD;  Location: [facility] ENDOSCOPY;  Service: Endoscopy;  Laterality: N/A;  . GALLBLADDER SURGERY    . KNEE ARTHROPLASTY Left 08/27/2015   Procedure: COMPUTER ASSISTED TOTAL KNEE ARTHROPLASTY;  Surgeon: Dereck Leep, MD;  Location: [facility] ORS;  Service: Orthopedics;  Laterality: Left;  . meniscal surgery Left     Social History   Tobacco Use  . Smoking status: Never Smoker  . Smokeless tobacco: Never Used  Substance Use Topics  . Alcohol use: No    Alcohol/week: 0.0  standard drinks  . Drug use: No     Medication list has been reviewed and updated.  Current Meds  Medication Sig  . amLODipine-benazepril (LOTREL) 5-10 MG capsule TAKE 1 CAPSULE BY MOUTH EVERY DAY  . aspirin 81 MG chewable tablet Chew 81 mg by mouth daily.   . Calcium Carb-Cholecalciferol (CALCIUM 600 + D PO) Take 1 tablet by mouth daily.  . Multiple Vitamins-Minerals (CENTRUM SILVER ULTRA WOMENS) TABS Take 1 tablet by mouth daily.    PHQ 2/9 Scores 05/04/2017 08/01/2015 07/02/2015 05/21/2015  PHQ - 2 Score 0 0 0 0    Physical Exam Constitutional:      General: She is not in acute distress.    Appearance: She is not diaphoretic.  HENT:     Head: Normocephalic and atraumatic.     Right Ear: Ear canal and external ear normal. Drainage present. No swelling or tenderness. Tympanic membrane is retracted.     Left Ear: Ear canal and external ear normal. Drainage present. No swelling or tenderness. Tympanic membrane is retracted.     Nose: Congestion and rhinorrhea present.     Right Sinus: Maxillary sinus tenderness and frontal sinus tenderness present.     Left Sinus: Maxillary sinus tenderness and frontal sinus tenderness present.     Mouth/Throat:     Mouth: No oral lesions.     Pharynx: No pharyngeal swelling, oropharyngeal exudate, posterior oropharyngeal erythema or uvula swelling.     Tonsils: No tonsillar exudate.  Eyes:     General:        Right eye: No discharge.        Left eye: No discharge.     Conjunctiva/sclera: Conjunctivae normal.     Pupils: Pupils are equal, round, and reactive to light.  Neck:     Musculoskeletal: Normal range of motion and neck supple.     Thyroid: No thyromegaly.     Vascular: No JVD.  Cardiovascular:     Rate and Rhythm: Normal rate and regular rhythm.     Heart sounds: Normal heart sounds. No murmur. No friction rub. No gallop.   Pulmonary:     Effort: Pulmonary effort is normal.     Breath sounds: Normal breath sounds.  Abdominal:      General: Bowel sounds are normal.     Palpations: Abdomen is soft. There is no mass.     Tenderness: There is no abdominal tenderness. There is no guarding.  Musculoskeletal: Normal range of motion.  Lymphadenopathy:     Head:     Right side of head: No submandibular or tonsillar adenopathy.     Left side of head: No submandibular or tonsillar adenopathy.     Cervical: No cervical adenopathy.     Right cervical: No superficial cervical adenopathy.    Left cervical: No superficial cervical adenopathy.  Skin:    General: Skin is warm and dry.  Neurological:     Mental Status: She is alert.     Deep Tendon Reflexes: Reflexes are normal and symmetric.     Wt Readings from Last 3 Encounters:  08/27/18 235 lb (106.6 kg)  07/22/18 239 lb (108.4 kg)  07/05/18 236 lb (107 kg)    BP 120/70   Pulse 80   Temp 98.4 F (36.9 C) (Oral)   Ht 5\' 2"  (1.575 m)   Wt 235 lb (106.6 kg)   SpO2 99%  BMI 42.98 kg/m   Assessment and Plan: 1. Acute maxillary sinusitis, recurrence not specified Acute.  Persistent.  Will initiate amoxicillin 500 mg 3 times a day for 10 days. - amoxicillin (AMOXIL) 500 MG capsule; Take 1 capsule (500 mg total) by mouth 3 (three) times daily.  Dispense: 30 capsule; Refill: 0

## 2018-08-31 DIAGNOSIS — M1712 Unilateral primary osteoarthritis, left knee: Secondary | ICD-10-CM | POA: Diagnosis not present

## 2018-08-31 DIAGNOSIS — Z96652 Presence of left artificial knee joint: Secondary | ICD-10-CM | POA: Diagnosis not present

## 2018-08-31 DIAGNOSIS — M7632 Iliotibial band syndrome, left leg: Secondary | ICD-10-CM | POA: Diagnosis not present

## 2018-09-20 ENCOUNTER — Encounter: Payer: Self-pay | Admitting: Family Medicine

## 2018-09-20 ENCOUNTER — Ambulatory Visit: Payer: BLUE CROSS/BLUE SHIELD | Admitting: Family Medicine

## 2018-09-20 ENCOUNTER — Other Ambulatory Visit: Payer: Self-pay

## 2018-09-20 VITALS — BP 130/80 | HR 80 | Ht 62.0 in | Wt 235.0 lb

## 2018-09-20 DIAGNOSIS — N3090 Cystitis, unspecified without hematuria: Secondary | ICD-10-CM

## 2018-09-20 LAB — POCT URINALYSIS DIPSTICK
Bilirubin, UA: NEGATIVE
Glucose, UA: NEGATIVE
Ketones, UA: POSITIVE
Nitrite, UA: POSITIVE
Protein, UA: POSITIVE — AB
Spec Grav, UA: 1.015 (ref 1.010–1.025)
Urobilinogen, UA: 0.2 E.U./dL
pH, UA: 6 (ref 5.0–8.0)

## 2018-09-20 MED ORDER — CEPHALEXIN 500 MG PO CAPS: 500.0000 mg | ORAL_CAPSULE | Freq: Three times a day (TID) | ORAL | 0 refills | Status: DC

## 2018-09-20 NOTE — Progress Notes (Signed)
Date:  09/20/2018   Name:  Shannon Blake   DOB:  [birth year]   MRN:  [id number]   Chief Complaint: Urinary Tract Infection (urge to urinate/ burning)  Urinary Tract Infection   This is a new problem. The current episode started yesterday. The problem occurs every urination. The problem has been gradually worsening. The quality of the pain is described as burning. The pain is at a severity of 4/10. The pain is moderate. There has been no fever. She is sexually active. Pertinent negatives include no chills, discharge, flank pain, frequency, hematuria, hesitancy, nausea, possible pregnancy, sweats, urgency or vomiting. She has tried nothing for the symptoms. The treatment provided no relief.    Review of Systems  Constitutional: Negative.  Negative for chills, fatigue, fever and unexpected weight change.  HENT: Negative for congestion, ear discharge, ear pain, rhinorrhea, sinus pressure, sneezing and sore throat.   Eyes: Negative for photophobia, pain, discharge, redness and itching.  Respiratory: Negative for cough, shortness of breath, wheezing and stridor.   Gastrointestinal: Negative for abdominal pain, blood in stool, constipation, diarrhea, nausea and vomiting.  Endocrine: Negative for cold intolerance, heat intolerance, polydipsia, polyphagia and polyuria.  Genitourinary: Negative for dysuria, flank pain, frequency, hematuria, hesitancy, menstrual problem, pelvic pain, urgency, vaginal bleeding and vaginal discharge.  Musculoskeletal: Negative for arthralgias, back pain and myalgias.  Skin: Negative for rash.  Allergic/Immunologic: Negative for environmental allergies and food allergies.  Neurological: Negative for dizziness, weakness, light-headedness, numbness and headaches.  Hematological: Negative for adenopathy. Does not bruise/bleed easily.  Psychiatric/Behavioral: Negative for dysphoric mood. The patient is not nervous/anxious.     Patient Active Problem List   Diagnosis Date  Noted  . Special screening for malignant neoplasms, colon   . Polyp of sigmoid colon   . Family history of peritoneal cancer 07/20/2017  . S/P total knee arthroplasty 08/27/2015  . Calculus of kidney 10/17/2014  . Alimentary obesity 10/17/2014  . Cough 10/17/2014  . Essential (primary) hypertension 10/17/2014  . Combined fat and carbohydrate induced hyperlipemia 10/17/2014  . Derangement of knee 12/01/2013  . Arthritis of knee, degenerative 12/01/2013    No Known Allergies  Past Surgical History:  Procedure Laterality Date  . CHOLECYSTECTOMY    . COLONOSCOPY  2007   Dr Candace Cruise  . COLONOSCOPY WITH PROPOFOL N/A 08/11/2017   Procedure: COLONOSCOPY WITH PROPOFOL;  Surgeon: Lucilla Lame, MD;  Location: [facility] ENDOSCOPY;  Service: Endoscopy;  Laterality: N/A;  . GALLBLADDER SURGERY    . KNEE ARTHROPLASTY Left 08/27/2015   Procedure: COMPUTER ASSISTED TOTAL KNEE ARTHROPLASTY;  Surgeon: Dereck Leep, MD;  Location: [facility] ORS;  Service: Orthopedics;  Laterality: Left;  . meniscal surgery Left     Social History   Tobacco Use  . Smoking status: Never Smoker  . Smokeless tobacco: Never Used  Substance Use Topics  . Alcohol use: No    Alcohol/week: 0.0 standard drinks  . Drug use: No     Medication list has been reviewed and updated.  Current Meds  Medication Sig  . amLODipine-benazepril (LOTREL) 5-10 MG capsule TAKE 1 CAPSULE BY MOUTH EVERY DAY  . aspirin 81 MG chewable tablet Chew 81 mg by mouth daily.   . Calcium Carb-Cholecalciferol (CALCIUM 600 + D PO) Take 1 tablet by mouth daily.  . Multiple Vitamins-Minerals (CENTRUM SILVER ULTRA WOMENS) TABS Take 1 tablet by mouth daily.    PHQ 2/9 Scores 05/04/2017 08/01/2015 07/02/2015 05/21/2015  PHQ - 2 Score 0 0 0 0  BP Readings from Last 3 Encounters:  09/20/18 130/80  08/27/18 120/70  07/22/18 (!) 142/84    Physical Exam Vitals signs and nursing note reviewed.  Constitutional:      General: She is not in acute distress.     Appearance: She is not diaphoretic.  HENT:     Head: Normocephalic and atraumatic.     Right Ear: External ear normal.     Left Ear: External ear normal.     Nose: Nose normal.  Eyes:     General:        Right eye: No discharge.        Left eye: No discharge.     Conjunctiva/sclera: Conjunctivae normal.     Pupils: Pupils are equal, round, and reactive to light.  Neck:     Musculoskeletal: Normal range of motion and neck supple.     Thyroid: No thyromegaly.     Vascular: No JVD.  Cardiovascular:     Rate and Rhythm: Normal rate and regular rhythm.     Heart sounds: Normal heart sounds. No murmur. No friction rub. No gallop.   Pulmonary:     Effort: Pulmonary effort is normal.     Breath sounds: Normal breath sounds.  Abdominal:     General: Bowel sounds are normal.     Palpations: Abdomen is soft. There is no mass.     Tenderness: There is abdominal tenderness in the suprapubic area. There is no right CVA tenderness, left CVA tenderness or guarding.  Musculoskeletal: Normal range of motion.  Lymphadenopathy:     Cervical: No cervical adenopathy.  Skin:    General: Skin is warm and dry.  Neurological:     Mental Status: She is alert.     Deep Tendon Reflexes: Reflexes are normal and symmetric.     Wt Readings from Last 3 Encounters:  09/20/18 235 lb (106.6 kg)  08/27/18 235 lb (106.6 kg)  07/22/18 239 lb (108.4 kg)    BP 130/80   Pulse 80   Ht 5\' 2"  (1.575 m)   Wt 235 lb (106.6 kg)   BMI 42.98 kg/m   Assessment and Plan: 1. Cystitis Acute.  Persistent.  Examination history and dipstick are are consistent with a urinary tract infection likely cystitis.  Will initiate cephalexin 500 mg three times a day for 5 days. - POCT urinalysis dipstick

## 2018-10-27 ENCOUNTER — Other Ambulatory Visit: Payer: Self-pay

## 2018-10-27 ENCOUNTER — Encounter: Payer: Self-pay | Admitting: Obstetrics and Gynecology

## 2018-10-27 ENCOUNTER — Ambulatory Visit
Admission: RE | Admit: 2018-10-27 | Discharge: 2018-10-27 | Disposition: A | Discharge status: Home / Self Care | Payer: BLUE CROSS/BLUE SHIELD | Source: Ambulatory Visit | Attending: Obstetrics and Gynecology | Admitting: Obstetrics and Gynecology

## 2018-10-27 DIAGNOSIS — Z1239 Encounter for other screening for malignant neoplasm of breast: Secondary | ICD-10-CM

## 2018-10-27 DIAGNOSIS — Z1231 Encounter for screening mammogram for malignant neoplasm of breast: Secondary | ICD-10-CM | POA: Diagnosis not present

## 2018-12-22 DIAGNOSIS — Z03818 Encounter for observation for suspected exposure to other biological agents ruled out: Secondary | ICD-10-CM | POA: Diagnosis not present

## 2018-12-28 DIAGNOSIS — B342 Coronavirus infection, unspecified: Secondary | ICD-10-CM | POA: Diagnosis not present

## 2019-01-03 ENCOUNTER — Ambulatory Visit: Payer: BC Managed Care – PPO | Admitting: Family Medicine

## 2019-01-03 ENCOUNTER — Encounter: Payer: Self-pay | Admitting: Family Medicine

## 2019-01-03 ENCOUNTER — Other Ambulatory Visit: Payer: Self-pay

## 2019-01-03 VITALS — BP 120/80 | HR 80 | Ht 62.0 in | Wt 236.0 lb

## 2019-01-03 DIAGNOSIS — E7801 Familial hypercholesterolemia: Secondary | ICD-10-CM

## 2019-01-03 DIAGNOSIS — I10 Essential (primary) hypertension: Secondary | ICD-10-CM

## 2019-01-03 MED ORDER — AMLODIPINE BESY-BENAZEPRIL HCL 5-10 MG PO CAPS: ORAL_CAPSULE | ORAL | 5 refills | Status: DC

## 2019-01-03 NOTE — Progress Notes (Signed)
Date:  01/03/2019   Name:  Shannon Blake   DOB:  1956/03/30   MRN:  [id number]   Chief Complaint: Hypertension  Hypertension This is a chronic problem. The current episode started more than 1 year ago. The problem is controlled. Pertinent negatives include no anxiety, blurred vision, chest pain, headaches, malaise/fatigue, neck pain, orthopnea, palpitations, peripheral edema, PND, shortness of breath or sweats. There are no associated agents to hypertension. There are no known risk factors for coronary artery disease. Past treatments include ACE inhibitors and calcium channel blockers. The current treatment provides moderate improvement. There are no compliance problems.  There is no history of angina, kidney disease, CAD/MI, CVA, heart failure, left ventricular hypertrophy, PVD or retinopathy. There is no history of chronic renal disease, a hypertension causing med or renovascular disease.    Review of Systems  Constitutional: Negative.  Negative for chills, fatigue, fever, malaise/fatigue and unexpected weight change.  HENT: Negative for congestion, ear discharge, ear pain, rhinorrhea, sinus pressure, sneezing and sore throat.   Eyes: Negative for blurred vision, photophobia, pain, discharge, redness and itching.  Respiratory: Negative for cough, shortness of breath, wheezing and stridor.   Cardiovascular: Negative for chest pain, palpitations, orthopnea and PND.  Gastrointestinal: Negative for abdominal pain, blood in stool, constipation, diarrhea, nausea and vomiting.  Endocrine: Negative for cold intolerance, heat intolerance, polydipsia, polyphagia and polyuria.  Genitourinary: Negative for dysuria, flank pain, frequency, hematuria, menstrual problem, pelvic pain, urgency, vaginal bleeding and vaginal discharge.  Musculoskeletal: Negative for arthralgias, back pain, myalgias and neck pain.  Skin: Negative for rash.  Allergic/Immunologic: Negative for environmental allergies and food  allergies.  Neurological: Negative for dizziness, weakness, light-headedness, numbness and headaches.  Hematological: Negative for adenopathy. Does not bruise/bleed easily.  Psychiatric/Behavioral: Negative for dysphoric mood. The patient is not nervous/anxious.     Patient Active Problem List   Diagnosis Date Noted  . Special screening for malignant neoplasms, colon   . Polyp of sigmoid colon   . Family history of peritoneal cancer 07/20/2017  . S/P total knee arthroplasty 08/27/2015  . Calculus of kidney 10/17/2014  . Alimentary obesity 10/17/2014  . Cough 10/17/2014  . Essential (primary) hypertension 10/17/2014  . Combined fat and carbohydrate induced hyperlipemia 10/17/2014  . Derangement of knee 12/01/2013  . Arthritis of knee, degenerative 12/01/2013    No Known Allergies  Past Surgical History:  Procedure Laterality Date  . CHOLECYSTECTOMY    . COLONOSCOPY  2007   Dr Candace Cruise  . COLONOSCOPY WITH PROPOFOL N/A 08/11/2017   Procedure: COLONOSCOPY WITH PROPOFOL;  Surgeon: Lucilla Lame, MD;  Location: [facility] ENDOSCOPY;  Service: Endoscopy;  Laterality: N/A;  . GALLBLADDER SURGERY    . KNEE ARTHROPLASTY Left 08/27/2015   Procedure: COMPUTER ASSISTED TOTAL KNEE ARTHROPLASTY;  Surgeon: Dereck Leep, MD;  Location: [facility] ORS;  Service: Orthopedics;  Laterality: Left;  . meniscal surgery Left     Social History   Tobacco Use  . Smoking status: Never Smoker  . Smokeless tobacco: Never Used  Substance Use Topics  . Alcohol use: No    Alcohol/week: 0.0 standard drinks  . Drug use: No     Medication list has been reviewed and updated.  Current Meds  Medication Sig  . amLODipine-benazepril (LOTREL) 5-10 MG capsule TAKE 1 CAPSULE BY MOUTH EVERY DAY  . aspirin 81 MG chewable tablet Chew 81 mg by mouth daily.   . Calcium Carb-Cholecalciferol (CALCIUM 600 + D PO) Take 1 tablet by mouth daily.  Marland Kitchen  Multiple Vitamins-Minerals (CENTRUM SILVER ULTRA WOMENS) TABS Take 1 tablet by mouth  daily.    PHQ 2/9 Scores 05/04/2017 08/01/2015 07/02/2015 05/21/2015  PHQ - 2 Score 0 0 0 0    BP Readings from Last 3 Encounters:  01/03/19 120/80  09/20/18 130/80  08/27/18 120/70    Physical Exam Vitals signs and nursing note reviewed.  Constitutional:      Appearance: She is well-developed.  HENT:     Head: Normocephalic.     Right Ear: Tympanic membrane, ear canal and external ear normal.     Left Ear: Tympanic membrane, ear canal and external ear normal.     Nose: Nose normal.  Eyes:     General: Lids are everted, no foreign bodies appreciated. No scleral icterus.       Left eye: No foreign body or hordeolum.     Conjunctiva/sclera: Conjunctivae normal.     Right eye: Right conjunctiva is not injected.     Left eye: Left conjunctiva is not injected.     Pupils: Pupils are equal, round, and reactive to light.  Neck:     Musculoskeletal: Normal range of motion and neck supple.     Thyroid: No thyromegaly.     Vascular: No JVD.     Trachea: No tracheal deviation.  Cardiovascular:     Rate and Rhythm: Normal rate and regular rhythm.     Chest Wall: PMI is not displaced. No thrill.     Pulses: Normal pulses.          Carotid pulses are 2+ on the right side and 2+ on the left side.      Radial pulses are 2+ on the right side and 2+ on the left side.       Femoral pulses are 2+ on the right side and 2+ on the left side.      Popliteal pulses are 2+ on the right side and 2+ on the left side.       Dorsalis pedis pulses are 2+ on the right side and 2+ on the left side.       Posterior tibial pulses are 2+ on the right side and 2+ on the left side.     Heart sounds: Normal heart sounds, S1 normal and S2 normal. No murmur. No systolic murmur. No diastolic murmur. No friction rub. No gallop. No S3 or S4 sounds.   Pulmonary:     Effort: Pulmonary effort is normal. No respiratory distress.     Breath sounds: Normal breath sounds. No wheezing or rales.  Abdominal:     General:  Bowel sounds are normal.     Palpations: Abdomen is soft. There is no mass.     Tenderness: There is no abdominal tenderness. There is no guarding or rebound.  Musculoskeletal: Normal range of motion.        General: No tenderness.     Right lower leg: No edema.     Left lower leg: No edema.  Lymphadenopathy:     Cervical: No cervical adenopathy.  Skin:    General: Skin is warm.     Findings: No rash.  Neurological:     Mental Status: She is alert and oriented to person, place, and time.     Cranial Nerves: No cranial nerve deficit.     Deep Tendon Reflexes: Reflexes normal.  Psychiatric:        Mood and Affect: Mood is not anxious or depressed.     Wt  Readings from Last 3 Encounters:  01/03/19 236 lb (107 kg)  09/20/18 235 lb (106.6 kg)  08/27/18 235 lb (106.6 kg)    BP 120/80   Pulse 80   Ht 5\' 2"  (1.575 m)   Wt 236 lb (107 kg)   BMI 43.16 kg/m   Assessment and Plan: 1. Essential (primary) hypertension Chronic.  Controlled.  Continue amlodipine benazepril 5-10 1 a day.  Will check renal function panel. - amLODipine-benazepril (LOTREL) 5-10 MG capsule; TAKE 1 CAPSULE BY MOUTH EVERY DAY  Dispense: 30 capsule; Refill: 5 - Renal Function Panel  2. Familial hypercholesterolemia Chronic.  Controlled with diet.  Will check lipid panel.- Lipid panel

## 2019-01-03 NOTE — Patient Instructions (Signed)
° °Calorie Counting for Weight Loss °Calories are units of energy. Your body needs a certain amount of calories from food to keep you going throughout the day. When you eat more calories than your body needs, your body stores the extra calories as fat. When you eat fewer calories than your body needs, your body burns fat to get the energy it needs. °Calorie counting means keeping track of how many calories you eat and drink each day. Calorie counting can be helpful if you need to lose weight. If you make sure to eat fewer calories than your body needs, you should lose weight. Ask your health care provider what a healthy weight is for you. °For calorie counting to work, you will need to eat the right number of calories in a day in order to lose a healthy amount of weight per week. A dietitian can help you determine how many calories you need in a day and will give you suggestions on how to reach your calorie goal. °· A healthy amount of weight to lose per week is usually 1-2 lb (0.5-0.9 kg). This usually means that your daily calorie intake should be reduced by 500-750 calories. °· Eating 1,200 - 1,500 calories per day can help most women lose weight. °· Eating 1,500 - 1,800 calories per day can help most men lose weight. °What is my plan? °My goal is to have __________ calories per day. °If I have this many calories per day, I should lose around __________ pounds per week. °What do I need to know about calorie counting? °In order to meet your daily calorie goal, you will need to: °· Find out how many calories are in each food you would like to eat. Try to do this before you eat. °· Decide how much of the food you plan to eat. °· Write down what you ate and how many calories it had. Doing this is called keeping a food log. °To successfully lose weight, it is important to balance calorie counting with a healthy lifestyle that includes regular activity. Aim for 150 minutes of moderate exercise (such as walking) or 75  minutes of vigorous exercise (such as running) each week. °Where do I find calorie information? ° °The number of calories in a food can be found on a Nutrition Facts label. If a food does not have a Nutrition Facts label, try to look up the calories online or ask your dietitian for help. °Remember that calories are listed per serving. If you choose to have more than one serving of a food, you will have to multiply the calories per serving by the amount of servings you plan to eat. For example, the label on a package of bread might say that a serving size is 1 slice and that there are 90 calories in a serving. If you eat 1 slice, you will have eaten 90 calories. If you eat 2 slices, you will have eaten 180 calories. °How do I keep a food log? °Immediately after each meal, record the following information in your food log: °· What you ate. Don't forget to include toppings, sauces, and other extras on the food. °· How much you ate. This can be measured in cups, ounces, or number of items. °· How many calories each food and drink had. °· The total number of calories in the meal. °Keep your food log near you, such as in a small notebook in your pocket, or use a mobile app or website. Some programs will   calculate calories for you and show you how many calories you have left for the day to meet your goal. °What are some calorie counting tips? ° °· Use your calories on foods and drinks that will fill you up and not leave you hungry: °? Some examples of foods that fill you up are nuts and nut butters, vegetables, lean proteins, and high-fiber foods like whole grains. High-fiber foods are foods with more than 5 g fiber per serving. °? Drinks such as sodas, specialty coffee drinks, alcohol, and juices have a lot of calories, yet do not fill you up. °· Eat nutritious foods and avoid empty calories. Empty calories are calories you get from foods or beverages that do not have many vitamins or protein, such as candy, sweets, and  soda. It is better to have a nutritious high-calorie food (such as an avocado) than a food with few nutrients (such as a bag of chips). °· Know how many calories are in the foods you eat most often. This will help you calculate calorie counts faster. °· Pay attention to calories in drinks. Low-calorie drinks include water and unsweetened drinks. °· Pay attention to nutrition labels for "low fat" or "fat free" foods. These foods sometimes have the same amount of calories or more calories than the full fat versions. They also often have added sugar, starch, or salt, to make up for flavor that was removed with the fat. °· Find a way of tracking calories that works for you. Get creative. Try different apps or programs if writing down calories does not work for you. °What are some portion control tips? °· Know how many calories are in a serving. This will help you know how many servings of a certain food you can have. °· Use a measuring cup to measure serving sizes. You could also try weighing out portions on a kitchen scale. With time, you will be able to estimate serving sizes for some foods. °· Take some time to put servings of different foods on your favorite plates, bowls, and cups so you know what a serving looks like. °· Try not to eat straight from a bag or box. Doing this can lead to overeating. Put the amount you would like to eat in a cup or on a plate to make sure you are eating the right portion. °· Use smaller plates, glasses, and bowls to prevent overeating. °· Try not to multitask (for example, watch TV or use your computer) while eating. If it is time to eat, sit down at a table and enjoy your food. This will help you to know when you are full. It will also help you to be aware of what you are eating and how much you are eating. °What are tips for following this plan? °Reading food labels °· Check the calorie count compared to the serving size. The serving size may be smaller than what you are used to  eating. °· Check the source of the calories. Make sure the food you are eating is high in vitamins and protein and low in saturated and trans fats. °Shopping °· Read nutrition labels while you shop. This will help you make healthy decisions before you decide to purchase your food. °· Make a grocery list and stick to it. °Cooking °· Try to cook your favorite foods in a healthier way. For example, try baking instead of frying. °· Use low-fat dairy products. °Meal planning °· Use more fruits and vegetables. Half of your plate should be   fruits and vegetables. °· Include lean proteins like poultry and fish. °How do I count calories when eating out? °· Ask for smaller portion sizes. °· Consider sharing an entree and sides instead of getting your own entree. °· If you get your own entree, eat only half. Ask for a box at the beginning of your meal and put the rest of your entree in it so you are not tempted to eat it. °· If calories are listed on the menu, choose the lower calorie options. °· Choose dishes that include vegetables, fruits, whole grains, low-fat dairy products, and lean protein. °· Choose items that are boiled, broiled, grilled, or steamed. Stay away from items that are buttered, battered, fried, or served with cream sauce. Items labeled "crispy" are usually fried, unless stated otherwise. °· Choose water, low-fat milk, unsweetened iced tea, or other drinks without added sugar. If you want an alcoholic beverage, choose a lower calorie option such as a glass of wine or light beer. °· Ask for dressings, sauces, and syrups on the side. These are usually high in calories, so you should limit the amount you eat. °· If you want a salad, choose a garden salad and ask for grilled meats. Avoid extra toppings like bacon, cheese, or fried items. Ask for the dressing on the side, or ask for olive oil and vinegar or lemon to use as dressing. °· Estimate how many servings of a food you are given. For example, a serving of  cooked rice is ½ cup or about the size of half a baseball. Knowing serving sizes will help you be aware of how much food you are eating at restaurants. The list below tells you how big or small some common portion sizes are based on everyday objects: °? 1 oz--4 stacked dice. °? 3 oz--1 deck of cards. °? 1 tsp--1 die. °? 1 Tbsp--½ a ping-pong ball. °? 2 Tbsp--1 ping-pong ball. °? ½ cup--½ baseball. °? 1 cup--1 baseball. °Summary °· Calorie counting means keeping track of how many calories you eat and drink each day. If you eat fewer calories than your body needs, you should lose weight. °· A healthy amount of weight to lose per week is usually 1-2 lb (0.5-0.9 kg). This usually means reducing your daily calorie intake by 500-750 calories. °· The number of calories in a food can be found on a Nutrition Facts label. If a food does not have a Nutrition Facts label, try to look up the calories online or ask your dietitian for help. °· Use your calories on foods and drinks that will fill you up, and not on foods and drinks that will leave you hungry. °· Use smaller plates, glasses, and bowls to prevent overeating. °This information is not intended to replace advice given to you by your health care provider. Make sure you discuss any questions you have with your health care provider. °Document Released: 05/26/2005 Document Revised: 02/12/2018 Document Reviewed: 04/25/2016 °Elsevier Patient Education © 2020 [employer] ° °

## 2019-01-04 LAB — RENAL FUNCTION PANEL
Albumin: 4.3 g/dL (ref 3.8–4.8)
BUN/Creatinine Ratio: 14 (ref 12–28)
BUN: 12 mg/dL (ref 8–27)
CO2: 22 mmol/L (ref 20–29)
Calcium: 9.4 mg/dL (ref 8.7–10.3)
Chloride: 103 mmol/L (ref 96–106)
Creatinine, Ser: 0.84 mg/dL (ref 0.57–1.00)
GFR calc Af Amer: 86 mL/min/{1.73_m2} (ref >59)
GFR calc non Af Amer: 74 mL/min/{1.73_m2} (ref >59)
Glucose: 109 mg/dL — ABNORMAL HIGH (ref 65–99)
Phosphorus: 3.3 mg/dL (ref 3.0–4.3)
Potassium: 4.3 mmol/L (ref 3.5–5.2)
Sodium: 139 mmol/L (ref 134–144)

## 2019-01-04 LAB — LIPID PANEL
Chol/HDL Ratio: 3.7 ratio (ref 0.0–4.4)
Cholesterol, Total: 197 mg/dL (ref 100–199)
HDL: 53 mg/dL (ref >39)
LDL Calculated: 125 mg/dL — ABNORMAL HIGH (ref 0–99)
Triglycerides: 93 mg/dL (ref 0–149)
VLDL Cholesterol Cal: 19 mg/dL (ref 5–40)

## 2019-06-01 ENCOUNTER — Other Ambulatory Visit: Payer: Self-pay

## 2019-06-01 ENCOUNTER — Ambulatory Visit (INDEPENDENT_AMBULATORY_CARE_PROVIDER_SITE_OTHER): Payer: BC Managed Care – PPO | Admitting: Family Medicine

## 2019-06-01 ENCOUNTER — Encounter: Payer: Self-pay | Admitting: Family Medicine

## 2019-06-01 VITALS — Temp 98.7°F

## 2019-06-01 DIAGNOSIS — R05 Cough: Secondary | ICD-10-CM | POA: Diagnosis not present

## 2019-06-01 DIAGNOSIS — R059 Cough, unspecified: Secondary | ICD-10-CM

## 2019-06-01 DIAGNOSIS — J0100 Acute maxillary sinusitis, unspecified: Secondary | ICD-10-CM

## 2019-06-01 MED ORDER — AMOXICILLIN 500 MG PO CAPS: 500.0000 mg | ORAL_CAPSULE | Freq: Three times a day (TID) | ORAL | 0 refills | Status: DC

## 2019-06-01 MED ORDER — GUAIFENESIN-CODEINE 100-10 MG/5ML PO SYRP: 5.0000 mL | ORAL_SOLUTION | Freq: Four times a day (QID) | ORAL | 0 refills | Status: DC | PRN

## 2019-06-01 NOTE — Progress Notes (Signed)
Date:  06/01/2019   Name:  Shannon Blake   DOB:  1956/02/06   MRN:  [id number]   Chief Complaint: Sinusitis (cough, sore throat, yellow production occasionally x 3-4 days- taking otc Robitussin- not helping. Tested neg for covid yesterday)  I connected withthis patient, Aby Bessinger, by telephoneat the patient's home.  I verified that I am speaking with the correct person using two identifiers. This visit was conducted via telephone due to the Covid-19 outbreak from my office at [facility] in [place], [place]. I discussed the limitations, risks, security and privacy concerns of performing an evaluation and management service by telephone. I also discussed with the patient that there may be a patient responsible charge related to this service. The patient expressed understanding and agreed to proceed.  Sinusitis This is a new problem. The current episode started in the past 7 days (2-3 days). The problem is unchanged. There has been no fever. The pain is mild. Associated symptoms include congestion, a hoarse voice, sinus pressure and a sore throat. Pertinent negatives include no chills, coughing, diaphoresis, ear pain, headaches, neck pain, shortness of breath, sneezing or swollen glands. (COVID negative earlier in week) Past treatments include oral decongestants, acetaminophen and antibiotics. The treatment provided moderate relief.    Lab Results  Component Value Date   CREATININE 0.84 01/03/2019   BUN 12 01/03/2019   NA 139 01/03/2019   K 4.3 01/03/2019   CL 103 01/03/2019   CO2 22 01/03/2019   Lab Results  Component Value Date   CHOL 197 01/03/2019   HDL 53 01/03/2019   LDLCALC 125 (H) 01/03/2019   TRIG 93 01/03/2019   CHOLHDL 3.7 01/03/2019   Lab Results  Component Value Date   TSH 2.30 12/28/2013   Lab Results  Component Value Date   HGBA1C 5.8 12/28/2013     Review of Systems  Constitutional: Negative for chills, diaphoresis and fever.  HENT: Positive for  congestion, hoarse voice, sinus pressure and sore throat. Negative for drooling, ear discharge, ear pain and sneezing.   Respiratory: Negative for cough, shortness of breath and wheezing.   Cardiovascular: Negative for chest pain, palpitations and leg swelling.  Gastrointestinal: Negative for abdominal pain, blood in stool, constipation, diarrhea and nausea.  Endocrine: Negative for polydipsia.  Genitourinary: Negative for dysuria, frequency, hematuria and urgency.  Musculoskeletal: Negative for back pain, myalgias and neck pain.  Skin: Negative for rash.  Allergic/Immunologic: Negative for environmental allergies.  Neurological: Negative for dizziness and headaches.  Hematological: Does not bruise/bleed easily.  Psychiatric/Behavioral: Negative for suicidal ideas. The patient is not nervous/anxious.     Patient Active Problem List   Diagnosis Date Noted  . Special screening for malignant neoplasms, colon   . Polyp of sigmoid colon   . Family history of peritoneal cancer 07/20/2017  . S/P total knee arthroplasty 08/27/2015  . Calculus of kidney 10/17/2014  . Alimentary obesity 10/17/2014  . Cough 10/17/2014  . Essential (primary) hypertension 10/17/2014  . Combined fat and carbohydrate induced hyperlipemia 10/17/2014  . Derangement of knee 12/01/2013  . Arthritis of knee, degenerative 12/01/2013    No Known Allergies  Past Surgical History:  Procedure Laterality Date  . CHOLECYSTECTOMY    . COLONOSCOPY  2007   Dr Candace Cruise  . COLONOSCOPY WITH PROPOFOL N/A 08/11/2017   Procedure: COLONOSCOPY WITH PROPOFOL;  Surgeon: Lucilla Lame, MD;  Location: [facility] ENDOSCOPY;  Service: Endoscopy;  Laterality: N/A;  . GALLBLADDER SURGERY    . KNEE  ARTHROPLASTY Left 08/27/2015   Procedure: COMPUTER ASSISTED TOTAL KNEE ARTHROPLASTY;  Surgeon: Dereck Leep, MD;  Location: [facility] ORS;  Service: Orthopedics;  Laterality: Left;  . meniscal surgery Left     Social History   Tobacco Use  . Smoking status:  Never Smoker  . Smokeless tobacco: Never Used  Substance Use Topics  . Alcohol use: No    Alcohol/week: 0.0 standard drinks  . Drug use: No     Medication list has been reviewed and updated.  Current Meds  Medication Sig  . amLODipine-benazepril (LOTREL) 5-10 MG capsule TAKE 1 CAPSULE BY MOUTH EVERY DAY  . aspirin 81 MG chewable tablet Chew 81 mg by mouth daily.   . Calcium Carb-Cholecalciferol (CALCIUM 600 + D PO) Take 1 tablet by mouth daily.  . Multiple Vitamins-Minerals (CENTRUM SILVER ULTRA WOMENS) TABS Take 1 tablet by mouth daily.    PHQ 2/9 Scores 06/01/2019 05/04/2017 08/01/2015 07/02/2015  PHQ - 2 Score 0 0 0 0  PHQ- 9 Score 0 - - -    BP Readings from Last 3 Encounters:  01/03/19 120/80  09/20/18 130/80  08/27/18 120/70    Physical Exam Vitals and nursing note reviewed.  Constitutional:      Appearance: She is well-developed.  Eyes:     General: Lids are everted, no foreign bodies appreciated.        Left eye: No foreign body or hordeolum.     Conjunctiva/sclera: Conjunctivae normal.     Right eye: Right conjunctiva is not injected.     Left eye: Left conjunctiva is not injected.  Neck:     Thyroid: No thyromegaly.     Vascular: No JVD.     Trachea: No tracheal deviation.  Cardiovascular:     Rate and Rhythm: Normal rate and regular rhythm.  Pulmonary:     Breath sounds: No wheezing.  Abdominal:     General: Bowel sounds are normal.  Musculoskeletal:        General: No tenderness. Normal range of motion.     Cervical back: Normal range of motion and neck supple.  Skin:    Findings: No rash.  Neurological:     Mental Status: She is alert.     Deep Tendon Reflexes: Reflexes normal.  Psychiatric:        Mood and Affect: Mood is not anxious or depressed.     Wt Readings from Last 3 Encounters:  01/03/19 236 lb (107 kg)  09/20/18 235 lb (106.6 kg)  08/27/18 235 lb (106.6 kg)    Temp 98.7 F (37.1 C) (Oral)   Assessment and Plan: Televisit  with patient who had an earlier negative Covid at work. 1. Acute maxillary sinusitis, recurrence not specified Acute.  Persistent.  Associated also with sore throat and cough.  As mentioned earlier Covid was negative at work.  Patient will initiate amoxicillin 500 mg 3 times a day for 10 days. - amoxicillin (AMOXIL) 500 MG capsule; Take 1 capsule (500 mg total) by mouth 3 (three) times daily.  Dispense: 30 capsule; Refill: 0  2. Cough Persistent.  New onset.  Likely secondary to postnasal drainage from sinus infection.  Will initiate Robitussin with codeine 1 teaspoon every 6 hours as needed cough. - guaiFENesin-codeine (ROBITUSSIN AC) 100-10 MG/5ML syrup; Take 5 mLs by mouth 4 (four) times daily as needed for cough.  Dispense: 118 mL; Refill: 0  I spent 10 minutes with this patient, More than 50% of that time was spent in  face to face education, counseling and care coordination.

## 2019-06-23 ENCOUNTER — Ambulatory Visit: Payer: BC Managed Care – PPO | Admitting: Family Medicine

## 2019-06-27 ENCOUNTER — Ambulatory Visit: Payer: BC Managed Care – PPO | Admitting: Family Medicine

## 2019-06-27 ENCOUNTER — Other Ambulatory Visit: Payer: Self-pay

## 2019-06-27 ENCOUNTER — Encounter: Payer: Self-pay | Admitting: Family Medicine

## 2019-06-27 VITALS — BP 120/82 | HR 60 | Ht 62.0 in | Wt 237.0 lb

## 2019-06-27 DIAGNOSIS — I10 Essential (primary) hypertension: Secondary | ICD-10-CM | POA: Diagnosis not present

## 2019-06-27 DIAGNOSIS — Z6841 Body Mass Index (BMI) 40.0 and over, adult: Secondary | ICD-10-CM

## 2019-06-27 DIAGNOSIS — E7801 Familial hypercholesterolemia: Secondary | ICD-10-CM | POA: Diagnosis not present

## 2019-06-27 MED ORDER — AMLODIPINE BESY-BENAZEPRIL HCL 5-10 MG PO CAPS: ORAL_CAPSULE | ORAL | 5 refills | Status: DC

## 2019-06-27 NOTE — Patient Instructions (Signed)

## 2019-06-27 NOTE — Progress Notes (Signed)
Date:  06/27/2019   Name:  Shannon Blake   DOB:  10-Jan-1956   MRN:  [id number]   Chief Complaint: Hypertension  Hypertension This is a chronic problem. The current episode started more than 1 year ago. The problem has been gradually improving since onset. The problem is controlled. Pertinent negatives include no anxiety, blurred vision, chest pain, headaches, malaise/fatigue, neck pain, orthopnea, palpitations, peripheral edema, PND, shortness of breath or sweats. There are no associated agents to hypertension. Risk factors for coronary artery disease include dyslipidemia. Past treatments include ACE inhibitors and calcium channel blockers. The current treatment provides moderate improvement. There are no compliance problems.  There is no history of angina, kidney disease, CAD/MI, CVA, heart failure, left ventricular hypertrophy, PVD or retinopathy. There is no history of chronic renal disease, a hypertension causing med or renovascular disease.    Lab Results  Component Value Date   CREATININE 0.84 01/03/2019   BUN 12 01/03/2019   NA 139 01/03/2019   K 4.3 01/03/2019   CL 103 01/03/2019   CO2 22 01/03/2019   Lab Results  Component Value Date   CHOL 197 01/03/2019   HDL 53 01/03/2019   LDLCALC 125 (H) 01/03/2019   TRIG 93 01/03/2019   CHOLHDL 3.7 01/03/2019   Lab Results  Component Value Date   TSH 2.30 12/28/2013   Lab Results  Component Value Date   HGBA1C 5.8 12/28/2013     Review of Systems  Constitutional: Negative.  Negative for chills, fatigue, fever, malaise/fatigue and unexpected weight change.  HENT: Negative for congestion, ear discharge, ear pain, rhinorrhea, sinus pressure, sneezing and sore throat.   Eyes: Negative for blurred vision, photophobia, pain, discharge, redness and itching.  Respiratory: Negative for cough, shortness of breath, wheezing and stridor.   Cardiovascular: Negative for chest pain, palpitations, orthopnea and PND.  Gastrointestinal:  Negative for abdominal pain, blood in stool, constipation, diarrhea, nausea and vomiting.  Endocrine: Negative for cold intolerance, heat intolerance, polydipsia, polyphagia and polyuria.  Genitourinary: Negative for dysuria, flank pain, frequency, hematuria, menstrual problem, pelvic pain, urgency, vaginal bleeding and vaginal discharge.  Musculoskeletal: Negative for arthralgias, back pain, myalgias and neck pain.  Skin: Negative for rash.  Allergic/Immunologic: Negative for environmental allergies and food allergies.  Neurological: Negative for dizziness, weakness, light-headedness, numbness and headaches.  Hematological: Negative for adenopathy. Does not bruise/bleed easily.  Psychiatric/Behavioral: Negative for dysphoric mood. The patient is not nervous/anxious.     Patient Active Problem List   Diagnosis Date Noted  . Special screening for malignant neoplasms, colon   . Polyp of sigmoid colon   . Family history of peritoneal cancer 07/20/2017  . S/P total knee arthroplasty 08/27/2015  . Calculus of kidney 10/17/2014  . Alimentary obesity 10/17/2014  . Cough 10/17/2014  . Essential (primary) hypertension 10/17/2014  . Combined fat and carbohydrate induced hyperlipemia 10/17/2014  . Derangement of knee 12/01/2013  . Arthritis of knee, degenerative 12/01/2013    No Known Allergies  Past Surgical History:  Procedure Laterality Date  . CHOLECYSTECTOMY    . COLONOSCOPY  2007   Dr Candace Cruise  . COLONOSCOPY WITH PROPOFOL N/A 08/11/2017   Procedure: COLONOSCOPY WITH PROPOFOL;  Surgeon: Lucilla Lame, MD;  Location[facility] ENDOSCOPY;  Service: Endoscopy;  Laterality: N/A;  . GALLBLADDER SURGERY    . KNEE ARTHROPLASTY Left 08/27/2015   Procedure: COMPUTER ASSISTED TOTAL KNEE ARTHROPLASTY;  Surgeon: Dereck Leep, MD;  Location: [facility] ORS;  Service: Orthopedics;  Laterality: Left;  .  meniscal surgery Left     Social History   Tobacco Use  . Smoking status: Never Smoker  . Smokeless tobacco:  Never Used  Substance Use Topics  . Alcohol use: No    Alcohol/week: 0.0 standard drinks  . Drug use: No     Medication list has been reviewed and updated.  Current Meds  Medication Sig  . amLODipine-benazepril (LOTREL) 5-10 MG capsule TAKE 1 CAPSULE BY MOUTH EVERY DAY  . aspirin 81 MG chewable tablet Chew 81 mg by mouth daily.   . Calcium Carb-Cholecalciferol (CALCIUM 600 + D PO) Take 1 tablet by mouth daily.  . Multiple Vitamins-Minerals (CENTRUM SILVER ULTRA WOMENS) TABS Take 1 tablet by mouth daily.    PHQ 2/9 Scores 06/27/2019 06/01/2019 05/04/2017 08/01/2015  PHQ - 2 Score 0 0 0 0  PHQ- 9 Score 0 0 - -    BP Readings from Last 3 Encounters:  06/27/19 120/82  01/03/19 120/80  09/20/18 130/80    Physical Exam Vitals and nursing note reviewed.  Constitutional:      Appearance: She is well-developed.  HENT:     Head: Normocephalic.     Right Ear: Tympanic membrane, ear canal and external ear normal.     Left Ear: Tympanic membrane, ear canal and external ear normal.     Nose: Nose normal.     Mouth/Throat:     Mouth: Mucous membranes are moist.  Eyes:     General: Lids are everted, no foreign bodies appreciated. No scleral icterus.       Left eye: No foreign body or hordeolum.     Conjunctiva/sclera: Conjunctivae normal.     Right eye: Right conjunctiva is not injected.     Left eye: Left conjunctiva is not injected.     Pupils: Pupils are equal, round, and reactive to light.  Neck:     Thyroid: No thyromegaly.     Vascular: No JVD.     Trachea: No tracheal deviation.  Cardiovascular:     Rate and Rhythm: Normal rate and regular rhythm.     Pulses: Normal pulses.     Heart sounds: Normal heart sounds, S1 normal and S2 normal. No murmur. No friction rub. No gallop.   Pulmonary:     Effort: Pulmonary effort is normal. No respiratory distress.     Breath sounds: Normal breath sounds. No wheezing, rhonchi or rales.  Abdominal:     General: Bowel sounds are  normal.     Palpations: Abdomen is soft. There is no mass.     Tenderness: There is no abdominal tenderness. There is no guarding or rebound.  Musculoskeletal:        General: No tenderness. Normal range of motion.     Cervical back: Normal range of motion and neck supple.  Lymphadenopathy:     Cervical: No cervical adenopathy.  Skin:    General: Skin is warm.     Findings: No rash.  Neurological:     Mental Status: She is alert and oriented to person, place, and time.     Cranial Nerves: No cranial nerve deficit.     Deep Tendon Reflexes: Reflexes normal.  Psychiatric:        Mood and Affect: Mood is not anxious or depressed.     Wt Readings from Last 3 Encounters:  06/27/19 237 lb (107.5 kg)  01/03/19 236 lb (107 kg)  09/20/18 235 lb (106.6 kg)    BP 120/82   Pulse 60  Ht 5\' 2"  (1.575 m)   Wt 237 lb (107.5 kg)   BMI 43.35 kg/m   Assessment and Plan: 1. Essential (primary) hypertension Chronic.  Controlled.  Stable.  Continue amlodipine benazepril 5-10 mg daily.  Will recheck in 6 months. - amLODipine-benazepril (LOTREL) 5-10 MG capsule; TAKE 1 CAPSULE BY MOUTH EVERY DAY  Dispense: 30 capsule; Refill: 5  2. BMI 40.0-44.9, adult [facility]) Health risks of being over weight were discussed and patient was counseled on weight loss options and exercise.  3. Familial hypercholesterolemia The LDL has been elevated on 2 occasions to the 125 range but still has there been a mild elevation of glucose some question whether this is fasting today she is fasting and we will recheck her LDL to see if this elevated if so I have given her the low-cholesterol sheet and will decide whether or not to initiate a statin at that time I am aware that she has atherosclerosis and that she may need to be covered for this. - Lipid Panel With LDL/HDL Ratio

## 2019-06-28 LAB — LIPID PANEL WITH LDL/HDL RATIO
Cholesterol, Total: 192 mg/dL (ref 100–199)
HDL: 60 mg/dL (ref >39)
LDL Chol Calc (NIH): 119 mg/dL — ABNORMAL HIGH (ref 0–99)
LDL/HDL Ratio: 2 ratio (ref 0.0–3.2)
Triglycerides: 70 mg/dL (ref 0–149)
VLDL Cholesterol Cal: 13 mg/dL (ref 5–40)

## 2019-07-24 NOTE — Progress Notes (Signed)
PCP: Juline Patch, MD   Chief Complaint  Patient presents with  . Gynecologic Exam    HPI:      [name] is a 64 y.o. G1P1001 who LMP was No LMP recorded. Patient is postmenopausal., presents today for her annual examination.  Her menses are absent due to menopause.  She does not have intermenstrual bleeding. She does not have vasomotor sx.   Sex activity: occas sexually active. She does not have vaginal dryness.  Last Pap: 07/22/18  Results were: no abnormalities /neg HPV DNA.  Hx of STDs: none  Last mammogram: 10/27/18 Results were: normal--routine follow-up in 12 months There is a FH of breast cancer in her mat aunt. There is a FH of ovarian/peritoneal cancer in her sister. Sister was [facility] neg through [place] 6/16. The patient does do self-breast exams.  Colonoscopy: colonoscopy 2019 without abnormalities.  Repeat due after 10 years.  Tobacco use: The patient denies current or previous tobacco use. Alcohol use: none  No drug use Exercise: moderately active  She does get adequate calcium and Vitamin D in her diet.  Labs with PCP.   Past Medical History:  Diagnosis Date  . Arthritis   . Family history of ovarian cancer    Pt's affected sister is [facility] neg  . Hypertension     Past Surgical History:  Procedure Laterality Date  . CHOLECYSTECTOMY    . COLONOSCOPY  2007   Dr Candace Cruise  . COLONOSCOPY WITH PROPOFOL N/A 08/11/2017   Procedure: COLONOSCOPY WITH PROPOFOL;  Surgeon: Lucilla Lame, MD;  Location: [facility] ENDOSCOPY;  Service: Endoscopy;  Laterality: N/A;  . GALLBLADDER SURGERY    . KNEE ARTHROPLASTY Left 08/27/2015   Procedure: COMPUTER ASSISTED TOTAL KNEE ARTHROPLASTY;  Surgeon: Dereck Leep, MD;  Location: [facility] ORS;  Service: Orthopedics;  Laterality: Left;  . meniscal surgery Left     Family History  Problem Relation Age of Onset  . Hypertension Mother   . Hypertension Father   . Cancer Sister 65       peritoneal/ovarian cancer; [facility] neg at [facility]  oncology 6/16  . Breast cancer Maternal Aunt 88  . Thyroid cancer Maternal Aunt        gland    Social History   Socioeconomic History  . Marital status: Single    Spouse name: Not on file  . Number of children: Not on file  . Years of education: Not on file  . Highest education level: Not on file  Occupational History  . Occupation: Optician, dispensing  Tobacco Use  . Smoking status: Never Smoker  . Smokeless tobacco: Never Used  Substance and Sexual Activity  . Alcohol use: No    Alcohol/week: 0.0 standard drinks  . Drug use: No  . Sexual activity: Yes    Birth control/protection: Post-menopausal  Other Topics Concern  . Not on file  Social History Narrative  . Not on file   Social Determinants of Health   Financial Resource Strain:   . Difficulty of Paying Living Expenses: Not on file  Food Insecurity:   . Worried About Charity fundraiser in the Last Year: Not on file  . Ran Out of Food in the Last Year: Not on file  Transportation Needs:   . Lack of Transportation (Medical): Not on file  . Lack of Transportation (Non-Medical): Not on file  Physical Activity:   . Days of Exercise per Week: Not on file  . Minutes of Exercise  per Session: Not on file  Stress:   . Feeling of Stress : Not on file  Social Connections:   . Frequency of Communication with Friends and Family: Not on file  . Frequency of Social Gatherings with Friends and Family: Not on file  . Attends Religious Services: Not on file  . Active Member of Clubs or Organizations: Not on file  . Attends Archivist Meetings: Not on file  . Marital Status: Not on file  Intimate Partner Violence:   . Fear of Current or Ex-Partner: Not on file  . Emotionally Abused: Not on file  . Physically Abused: Not on file  . Sexually Abused: Not on file    Current Meds  Medication Sig  . amLODipine-benazepril (LOTREL) 5-10 MG capsule TAKE 1 CAPSULE BY MOUTH EVERY DAY  . aspirin 81 MG chewable tablet Chew 81  mg by mouth daily.   . Calcium Carb-Cholecalciferol (CALCIUM 600 + D PO) Take 1 tablet by mouth daily.  . Multiple Vitamins-Minerals (CENTRUM SILVER ULTRA WOMENS) TABS Take 1 tablet by mouth daily.      ROS:  Review of Systems  Constitutional: Negative for fatigue, fever and unexpected weight change.  Respiratory: Negative for cough, shortness of breath and wheezing.   Cardiovascular: Negative for chest pain, palpitations and leg swelling.  Gastrointestinal: Negative for blood in stool, constipation, diarrhea, nausea and vomiting.  Endocrine: Negative for cold intolerance, heat intolerance and polyuria.  Genitourinary: Negative for dyspareunia, dysuria, flank pain, frequency, genital sores, hematuria, menstrual problem, pelvic pain, urgency, vaginal bleeding, vaginal discharge and vaginal pain.  Musculoskeletal: Negative for back pain, joint swelling and myalgias.  Skin: Negative for rash.  Neurological: Negative for dizziness, syncope, light-headedness, numbness and headaches.  Hematological: Negative for adenopathy.  Psychiatric/Behavioral: Negative for agitation, confusion, sleep disturbance and suicidal ideas. The patient is not nervous/anxious.      Objective: BP 130/90   Ht 5\' 2"  (1.575 m)   Wt 238 lb (108 kg)   BMI 43.53 kg/m    Physical Exam Constitutional:      Appearance: She is well-developed.  Genitourinary:     Vulva, vagina, uterus, right adnexa and left adnexa normal.     No vulval lesion or tenderness noted.     No vaginal discharge, erythema or tenderness.     No cervical motion tenderness or polyp.     Uterus is not enlarged or tender.     No right or left adnexal mass present.     Right adnexa not tender.     Left adnexa not tender.  Neck:     Thyroid: No thyromegaly.  Cardiovascular:     Rate and Rhythm: Normal rate and regular rhythm.     Heart sounds: Normal heart sounds. No murmur.  Pulmonary:     Effort: Pulmonary effort is normal.     Breath  sounds: Normal breath sounds.  Chest:     Breasts:        Right: No mass, nipple discharge, skin change or tenderness.        Left: No mass, nipple discharge, skin change or tenderness.  Abdominal:     Palpations: Abdomen is soft.     Tenderness: There is no abdominal tenderness. There is no guarding.  Musculoskeletal:        General: Normal range of motion.     Cervical back: Normal range of motion.  Neurological:     General: No focal deficit present.     Mental  Status: She is alert and oriented to person, place, and time.     Cranial Nerves: No cranial nerve deficit.  Skin:    General: Skin is warm and dry.  Psychiatric:        Mood and Affect: Mood normal.        Behavior: Behavior normal.        Thought Content: Thought content normal.        Judgment: Judgment normal.  Vitals reviewed.     Assessment/Plan:  Encounter for annual routine gynecological examination  Encounter for screening mammogram for malignant neoplasm of breast - Plan: MM 3D SCREEN BREAST BILATERAL, pt to sched mammo         GYN counsel breast self exam, mammography screening, menopause, adequate intake of calcium and vitamin D, diet and exercise    F/U  Return in about 1 year (around 07/24/2020).  Martavion Couper B. Alliya Marcon, PA-C 07/25/2019 8:30 AM

## 2019-07-25 ENCOUNTER — Encounter: Payer: Self-pay | Admitting: Obstetrics and Gynecology

## 2019-07-25 ENCOUNTER — Ambulatory Visit (INDEPENDENT_AMBULATORY_CARE_PROVIDER_SITE_OTHER): Payer: BC Managed Care – PPO | Admitting: Obstetrics and Gynecology

## 2019-07-25 ENCOUNTER — Other Ambulatory Visit: Payer: Self-pay

## 2019-07-25 VITALS — BP 130/90 | Ht 62.0 in | Wt 238.0 lb

## 2019-07-25 DIAGNOSIS — Z01419 Encounter for gynecological examination (general) (routine) without abnormal findings: Secondary | ICD-10-CM

## 2019-07-25 DIAGNOSIS — Z1231 Encounter for screening mammogram for malignant neoplasm of breast: Secondary | ICD-10-CM

## 2019-07-25 NOTE — Patient Instructions (Signed)
I value your feedback and entrusting us with your care. If you get a [facility] patient survey, I would appreciate you taking the time to let us know about your experience today. Thank you! ° °As of May 19, 2019, your lab results will be released to your MyChart immediately, before I even have a chance to see them. Please give me time to review them and contact you if there are any abnormalities. Thank you for your patience.  ° °[facility]: [phone] ° ° ° °

## 2019-09-01 DIAGNOSIS — Z96652 Presence of left artificial knee joint: Secondary | ICD-10-CM | POA: Diagnosis not present

## 2019-09-01 DIAGNOSIS — S86892A Other injury of other muscle(s) and tendon(s) at lower leg level, left leg, initial encounter: Secondary | ICD-10-CM | POA: Diagnosis not present

## 2019-10-28 ENCOUNTER — Ambulatory Visit
Admission: RE | Admit: 2019-10-28 | Discharge: 2019-10-28 | Disposition: A | Discharge status: Home / Self Care | Payer: 59 | Source: Ambulatory Visit | Attending: Obstetrics and Gynecology | Admitting: Obstetrics and Gynecology

## 2019-10-28 DIAGNOSIS — Z1231 Encounter for screening mammogram for malignant neoplasm of breast: Secondary | ICD-10-CM | POA: Diagnosis not present

## 2019-10-29 ENCOUNTER — Encounter: Payer: Self-pay | Admitting: Obstetrics and Gynecology

## 2019-12-15 ENCOUNTER — Encounter: Payer: Self-pay | Admitting: Family Medicine

## 2019-12-15 ENCOUNTER — Other Ambulatory Visit: Payer: Self-pay

## 2019-12-15 ENCOUNTER — Ambulatory Visit: Payer: No Typology Code available for payment source | Admitting: Family Medicine

## 2019-12-15 VITALS — BP 100/70 | HR 68 | Ht 62.0 in | Wt 237.0 lb

## 2019-12-15 DIAGNOSIS — Z6841 Body Mass Index (BMI) 40.0 and over, adult: Secondary | ICD-10-CM

## 2019-12-15 DIAGNOSIS — I10 Essential (primary) hypertension: Secondary | ICD-10-CM | POA: Diagnosis not present

## 2019-12-15 MED ORDER — AMLODIPINE BESY-BENAZEPRIL HCL 5-10 MG PO CAPS: ORAL_CAPSULE | ORAL | 5 refills | Status: DC

## 2019-12-15 NOTE — Progress Notes (Signed)
Date:  12/15/2019   Name:  Shannon Blake   DOB:  06-18-1955   MRN:  [id number]   Chief Complaint: Hypertension  Hypertension This is a chronic problem. The current episode started more than 1 year ago. The problem is controlled. Pertinent negatives include no anxiety, blurred vision, chest pain, headaches, malaise/fatigue, neck pain, orthopnea, palpitations, peripheral edema, PND, shortness of breath or sweats. There are no associated agents to hypertension. Risk factors for coronary artery disease include obesity and family history. Past treatments include ACE inhibitors and calcium channel blockers. The current treatment provides mild improvement. There are no compliance problems.  There is no history of angina, kidney disease, CAD/MI, CVA, heart failure, left ventricular hypertrophy, PVD or retinopathy. There is no history of chronic renal disease, a hypertension causing med or renovascular disease.    Lab Results  Component Value Date   CREATININE 0.84 01/03/2019   BUN 12 01/03/2019   NA 139 01/03/2019   K 4.3 01/03/2019   CL 103 01/03/2019   CO2 22 01/03/2019   Lab Results  Component Value Date   CHOL 192 06/27/2019   HDL 60 06/27/2019   LDLCALC 119 (H) 06/27/2019   TRIG 70 06/27/2019   CHOLHDL 3.7 01/03/2019   Lab Results  Component Value Date   TSH 2.30 12/28/2013   Lab Results  Component Value Date   HGBA1C 5.8 12/28/2013   Lab Results  Component Value Date   WBC 8.4 01/05/2017   HGB 13.9 01/05/2017   HCT 42.1 01/05/2017   MCV 81.8 01/05/2017   PLT 431 01/05/2017   Lab Results  Component Value Date   ALT 23 01/05/2017   AST 23 01/05/2017   ALKPHOS 79 01/05/2017   BILITOT 0.5 01/05/2017     Review of Systems  Constitutional: Negative.  Negative for chills, fatigue, fever, malaise/fatigue and unexpected weight change.  HENT: Negative for congestion, ear discharge, ear pain, nosebleeds, postnasal drip, rhinorrhea, sinus pressure, sneezing and sore throat.     Eyes: Negative for blurred vision, photophobia, pain, discharge, redness and itching.  Respiratory: Negative for cough, shortness of breath, wheezing and stridor.   Cardiovascular: Negative for chest pain, palpitations, orthopnea and PND.  Gastrointestinal: Negative for abdominal pain, blood in stool, constipation, diarrhea, nausea and vomiting.  Endocrine: Negative for cold intolerance, heat intolerance, polydipsia, polyphagia and polyuria.  Genitourinary: Negative for dysuria, flank pain, frequency, hematuria, menstrual problem, pelvic pain, urgency, vaginal bleeding and vaginal discharge.  Musculoskeletal: Negative for arthralgias, back pain, myalgias and neck pain.  Skin: Negative for rash.  Allergic/Immunologic: Negative for environmental allergies and food allergies.  Neurological: Negative for dizziness, weakness, light-headedness, numbness and headaches.  Hematological: Negative for adenopathy. Does not bruise/bleed easily.  Psychiatric/Behavioral: Negative for dysphoric mood. The patient is not nervous/anxious.     Patient Active Problem List   Diagnosis Date Noted  . BMI 40.0-44.9, adult ([place]) 06/27/2019  . Familial hypercholesterolemia 06/27/2019  . Special screening for malignant neoplasms, colon   . Polyp of sigmoid colon   . Family history of peritoneal cancer 07/20/2017  . S/P total knee arthroplasty 08/27/2015  . Calculus of kidney 10/17/2014  . Alimentary obesity 10/17/2014  . Cough 10/17/2014  . Essential (primary) hypertension 10/17/2014  . Combined fat and carbohydrate induced hyperlipemia 10/17/2014  . Derangement of knee 12/01/2013  . Arthritis of knee, degenerative 12/01/2013    No Known Allergies  Past Surgical History:  Procedure Laterality Date  . CHOLECYSTECTOMY    . COLONOSCOPY  2007   Dr Candace Cruise  . COLONOSCOPY WITH PROPOFOL N/A 08/11/2017   Procedure: COLONOSCOPY WITH PROPOFOL;  Surgeon: Lucilla Lame, MD;  Location: [facility] ENDOSCOPY;  Service: Endoscopy;   Laterality: N/A;  . GALLBLADDER SURGERY    . KNEE ARTHROPLASTY Left 08/27/2015   Procedure: COMPUTER ASSISTED TOTAL KNEE ARTHROPLASTY;  Surgeon: Dereck Leep, MD;  Location: [facility] ORS;  Service: Orthopedics;  Laterality: Left;  . meniscal surgery Left     Social History   Tobacco Use  . Smoking status: Never Smoker  . Smokeless tobacco: Never Used  Vaping Use  . Vaping Use: Never used  Substance Use Topics  . Alcohol use: No    Alcohol/week: 0.0 standard drinks  . Drug use: No     Medication list has been reviewed and updated.  Current Meds  Medication Sig  . amLODipine-benazepril (LOTREL) 5-10 MG capsule TAKE 1 CAPSULE BY MOUTH EVERY DAY  . aspirin 81 MG chewable tablet Chew 81 mg by mouth daily.   . Calcium Carb-Cholecalciferol (CALCIUM 600 + D PO) Take 1 tablet by mouth daily.  . Multiple Vitamins-Minerals (CENTRUM SILVER ULTRA WOMENS) TABS Take 1 tablet by mouth daily.    PHQ 2/9 Scores 12/15/2019 06/27/2019 06/01/2019 05/04/2017  PHQ - 2 Score 0 0 0 0  PHQ- 9 Score 0 0 0 -    GAD 7 : Generalized Anxiety Score 12/15/2019 06/27/2019 06/01/2019  Nervous, Anxious, on Edge 0 0 0  Control/stop worrying 0 0 0  Worry too much - different things 0 0 0  Trouble relaxing 0 0 0  Restless 0 0 0  Easily annoyed or irritable 0 0 0  Afraid - awful might happen 0 0 0  Total GAD 7 Score 0 0 0    BP Readings from Last 3 Encounters:  12/15/19 100/70  07/25/19 130/90  06/27/19 120/82    Physical Exam Constitutional:      Appearance: Normal appearance. She is well-developed. She is obese.  HENT:     Head: Normocephalic.     Right Ear: Tympanic membrane, ear canal and external ear normal.     Left Ear: Tympanic membrane, ear canal and external ear normal.  Eyes:     General: Lids are everted, no foreign bodies appreciated. No scleral icterus.       Left eye: No foreign body or hordeolum.     Conjunctiva/sclera: Conjunctivae normal.     Right eye: Right conjunctiva is not  injected.     Left eye: Left conjunctiva is not injected.     Pupils: Pupils are equal, round, and reactive to light.  Neck:     Thyroid: No thyromegaly.     Vascular: No JVD.     Trachea: No tracheal deviation.  Cardiovascular:     Rate and Rhythm: Normal rate and regular rhythm.     Heart sounds: Normal heart sounds. No murmur heard.  No friction rub. No gallop.   Pulmonary:     Effort: Pulmonary effort is normal. No respiratory distress.     Breath sounds: Normal breath sounds. No wheezing or rales.  Abdominal:     General: Bowel sounds are normal.     Palpations: Abdomen is soft. There is no mass.     Tenderness: There is no abdominal tenderness. There is no guarding or rebound.  Musculoskeletal:        General: No tenderness. Normal range of motion.     Cervical back: Normal range of motion and neck supple.  Lymphadenopathy:     Cervical: No cervical adenopathy.  Skin:    General: Skin is warm.     Findings: No rash.  Neurological:     Mental Status: She is alert and oriented to person, place, and time.     Cranial Nerves: No cranial nerve deficit.     Deep Tendon Reflexes: Reflexes normal.  Psychiatric:        Mood and Affect: Mood is not anxious or depressed.     Wt Readings from Last 3 Encounters:  12/15/19 237 lb (107.5 kg)  07/25/19 238 lb (108 kg)  06/27/19 237 lb (107.5 kg)    BP 100/70   Pulse 68   Ht 5\' 2"  (1.575 m)   Wt 237 lb (107.5 kg)   BMI 43.35 kg/m   Assessment and Plan: 1. Essential (primary) hypertension Chronic.  Controlled.  Stable.  Continue amlodipine benazepril 5-10 mg once a day.  Will recheck renal function panel to assess electrolytes and GFR.  And we will recheck patient for blood pressure control in 6 months. - Renal Function Panel - amLODipine-benazepril (LOTREL) 5-10 MG capsule; TAKE 1 CAPSULE BY MOUTH EVERY DAY  Dispense: 30 capsule; Refill: 5  2. BMI 40.0-44.9, adult [facility]) Health risks of being over weight were discussed and  patient was counseled on weight loss options and exercise.  Patient specifically has been given a reduced calorie diet and suggestion of 1200 to 1500 cal/day.

## 2019-12-15 NOTE — Patient Instructions (Signed)

## 2019-12-16 LAB — RENAL FUNCTION PANEL
Albumin: 4.5 g/dL (ref 3.8–4.8)
BUN/Creatinine Ratio: 18 (ref 12–28)
BUN: 14 mg/dL (ref 8–27)
CO2: 22 mmol/L (ref 20–29)
Calcium: 9.7 mg/dL (ref 8.7–10.3)
Chloride: 106 mmol/L (ref 96–106)
Creatinine, Ser: 0.78 mg/dL (ref 0.57–1.00)
GFR calc Af Amer: 93 mL/min/{1.73_m2} (ref >59)
GFR calc non Af Amer: 81 mL/min/{1.73_m2} (ref >59)
Glucose: 85 mg/dL (ref 65–99)
Phosphorus: 3.9 mg/dL (ref 3.0–4.3)
Potassium: 4.9 mmol/L (ref 3.5–5.2)
Sodium: 143 mmol/L (ref 134–144)

## 2020-01-09 ENCOUNTER — Other Ambulatory Visit: Payer: Self-pay

## 2020-01-09 ENCOUNTER — Encounter: Payer: Self-pay | Admitting: Family Medicine

## 2020-01-09 ENCOUNTER — Ambulatory Visit: Payer: No Typology Code available for payment source | Admitting: Family Medicine

## 2020-01-09 VITALS — BP 112/76 | HR 75 | Temp 98.5°F | Ht 62.0 in | Wt 232.0 lb

## 2020-01-09 DIAGNOSIS — R05 Cough: Secondary | ICD-10-CM | POA: Diagnosis not present

## 2020-01-09 DIAGNOSIS — J301 Allergic rhinitis due to pollen: Secondary | ICD-10-CM | POA: Diagnosis not present

## 2020-01-09 DIAGNOSIS — R059 Cough, unspecified: Secondary | ICD-10-CM

## 2020-01-09 DIAGNOSIS — J0100 Acute maxillary sinusitis, unspecified: Secondary | ICD-10-CM | POA: Diagnosis not present

## 2020-01-09 MED ORDER — GUAIFENESIN-CODEINE 100-10 MG/5ML PO SYRP: 5.0000 mL | ORAL_SOLUTION | Freq: Four times a day (QID) | ORAL | 0 refills | Status: DC | PRN

## 2020-01-09 MED ORDER — AMOXICILLIN 500 MG PO CAPS: 500.0000 mg | ORAL_CAPSULE | Freq: Three times a day (TID) | ORAL | 0 refills | Status: DC

## 2020-01-09 NOTE — Progress Notes (Addendum)
Date:  01/09/2020   Name:  Shannon Blake   DOB:  [birth year]   MRN:  [id number]   Chief Complaint: Cough (X2-3 days, no fever, sore throat hurts to swallow, yellow mucous (cough),runny nose with yellow mucous, inside of nose is raw  ) and Ear Pain (X2-3 days, itching )  Cough This is a new problem. The current episode started in the past 7 days (friday). The problem has been waxing and waning. The cough is productive of purulent sputum. Associated symptoms include ear congestion, nasal congestion, postnasal drip, rhinorrhea and a sore throat. Pertinent negatives include no chest pain, chills, ear pain, eye redness, fever, headaches, heartburn, hemoptysis, myalgias, rash, shortness of breath, sweats, weight loss or wheezing. Nothing aggravates the symptoms. The treatment provided mild relief. There is no history of asthma, COPD or environmental allergies.  Sinusitis This is a new problem. The current episode started in the past 7 days. The problem has been waxing and waning since onset. Associated symptoms include congestion, coughing, sinus pressure, sneezing and a sore throat. Pertinent negatives include no chills, diaphoresis, ear pain, headaches, shortness of breath or swollen glands. Past treatments include acetaminophen. The treatment provided no relief.    Lab Results  Component Value Date   CREATININE 0.78 12/15/2019   BUN 14 12/15/2019   NA 143 12/15/2019   K 4.9 12/15/2019   CL 106 12/15/2019   CO2 22 12/15/2019   Lab Results  Component Value Date   CHOL 192 06/27/2019   HDL 60 06/27/2019   LDLCALC 119 (H) 06/27/2019   TRIG 70 06/27/2019   CHOLHDL 3.7 01/03/2019   Lab Results  Component Value Date   TSH 2.30 12/28/2013   Lab Results  Component Value Date   HGBA1C 5.8 12/28/2013   Lab Results  Component Value Date   WBC 8.4 01/05/2017   HGB 13.9 01/05/2017   HCT 42.1 01/05/2017   MCV 81.8 01/05/2017   PLT 431 01/05/2017   Lab Results  Component Value Date   ALT  23 01/05/2017   AST 23 01/05/2017   ALKPHOS 79 01/05/2017   BILITOT 0.5 01/05/2017     Review of Systems  Constitutional: Negative.  Negative for chills, diaphoresis, fatigue, fever, unexpected weight change and weight loss.  HENT: Positive for congestion, postnasal drip, rhinorrhea, sinus pressure, sneezing and sore throat. Negative for ear discharge and ear pain.   Eyes: Negative for photophobia, pain, discharge, redness and itching.  Respiratory: Positive for cough. Negative for hemoptysis, shortness of breath, wheezing and stridor.   Cardiovascular: Negative for chest pain.  Gastrointestinal: Negative for abdominal pain, blood in stool, constipation, diarrhea, heartburn, nausea and vomiting.  Endocrine: Negative for cold intolerance, heat intolerance, polydipsia, polyphagia and polyuria.  Genitourinary: Negative for dysuria, flank pain, frequency, hematuria, menstrual problem, pelvic pain, urgency, vaginal bleeding and vaginal discharge.  Musculoskeletal: Negative for arthralgias, back pain and myalgias.  Skin: Negative for rash.  Allergic/Immunologic: Negative for environmental allergies and food allergies.  Neurological: Negative for dizziness, weakness, light-headedness, numbness and headaches.  Hematological: Negative for adenopathy. Does not bruise/bleed easily.  Psychiatric/Behavioral: Negative for dysphoric mood. The patient is not nervous/anxious.     Patient Active Problem List   Diagnosis Date Noted  . BMI 40.0-44.9, adult ([place]) 06/27/2019  . Familial hypercholesterolemia 06/27/2019  . Special screening for malignant neoplasms, colon   . Polyp of sigmoid colon   . Family history of peritoneal cancer 07/20/2017  . S/P total knee arthroplasty 08/27/2015  .  Calculus of kidney 10/17/2014  . Alimentary obesity 10/17/2014  . Cough 10/17/2014  . Essential (primary) hypertension 10/17/2014  . Combined fat and carbohydrate induced hyperlipemia 10/17/2014  . Derangement of  knee 12/01/2013  . Arthritis of knee, degenerative 12/01/2013    No Known Allergies  Past Surgical History:  Procedure Laterality Date  . CHOLECYSTECTOMY    . COLONOSCOPY  2007   Dr Candace Cruise  . COLONOSCOPY WITH PROPOFOL N/A 08/11/2017   Procedure: COLONOSCOPY WITH PROPOFOL;  Surgeon: Lucilla Lame, MD;  Location: [facility] ENDOSCOPY;  Service: Endoscopy;  Laterality: N/A;  . GALLBLADDER SURGERY    . KNEE ARTHROPLASTY Left 08/27/2015   Procedure: COMPUTER ASSISTED TOTAL KNEE ARTHROPLASTY;  Surgeon: Dereck Leep, MD;  Location: [facility] ORS;  Service: Orthopedics;  Laterality: Left;  . meniscal surgery Left     Social History   Tobacco Use  . Smoking status: Never Smoker  . Smokeless tobacco: Never Used  Vaping Use  . Vaping Use: Never used  Substance Use Topics  . Alcohol use: No    Alcohol/week: 0.0 standard drinks  . Drug use: No     Medication list has been reviewed and updated.  Current Meds  Medication Sig  . amLODipine-benazepril (LOTREL) 5-10 MG capsule TAKE 1 CAPSULE BY MOUTH EVERY DAY  . aspirin 81 MG chewable tablet Chew 81 mg by mouth daily.   . Calcium Carb-Cholecalciferol (CALCIUM 600 + D PO) Take 1 tablet by mouth daily.  . Multiple Vitamins-Minerals (CENTRUM SILVER ULTRA WOMENS) TABS Take 1 tablet by mouth daily.    PHQ 2/9 Scores 01/09/2020 12/15/2019 06/27/2019 06/01/2019  PHQ - 2 Score 0 0 0 0  PHQ- 9 Score 0 0 0 0    GAD 7 : Generalized Anxiety Score 01/09/2020 12/15/2019 06/27/2019 06/01/2019  Nervous, Anxious, on Edge 0 0 0 0  Control/stop worrying 0 0 0 0  Worry too much - different things 0 0 0 0  Trouble relaxing 0 0 0 0  Restless 0 0 0 0  Easily annoyed or irritable 0 0 0 0  Afraid - awful might happen 0 0 0 0  Total GAD 7 Score 0 0 0 0    BP Readings from Last 3 Encounters:  01/09/20 112/76  12/15/19 100/70  07/25/19 130/90    Physical Exam Vitals and nursing note reviewed.  Constitutional:      Appearance: She is well-developed.  HENT:     Head:  Normocephalic.     Right Ear: External ear normal. Tympanic membrane is retracted.     Left Ear: External ear normal. Tympanic membrane is retracted.     Nose:     Right Turbinates: Swollen.     Left Turbinates: Swollen.     Right Sinus: Maxillary sinus tenderness and frontal sinus tenderness present.     Left Sinus: Maxillary sinus tenderness and frontal sinus tenderness present.     Mouth/Throat:     Lips: Pink.     Mouth: Mucous membranes are moist.     Dentition: Normal dentition.     Tongue: No lesions.     Palate: No mass.     Pharynx: Oropharynx is clear. Uvula midline.  Eyes:     General: Lids are everted, no foreign bodies appreciated. No scleral icterus.       Left eye: No foreign body or hordeolum.     Extraocular Movements: Extraocular movements intact.     Conjunctiva/sclera: Conjunctivae normal.     Right eye: Right conjunctiva  is not injected.     Left eye: Left conjunctiva is not injected.     Pupils: Pupils are equal, round, and reactive to light.  Neck:     Thyroid: No thyroid mass, thyromegaly or thyroid tenderness.     Vascular: Normal carotid pulses. No carotid bruit, hepatojugular reflux or JVD.     Trachea: Trachea normal. No tracheal deviation.  Cardiovascular:     Rate and Rhythm: Normal rate and regular rhythm.     Chest Wall: PMI is not displaced. No thrill.     Pulses: Normal pulses.     Heart sounds: Normal heart sounds. No murmur heard.  No systolic murmur is present.  No diastolic murmur is present.  No friction rub. No gallop. No S3 or S4 sounds.   Pulmonary:     Effort: Pulmonary effort is normal. No respiratory distress.     Breath sounds: Normal breath sounds. No wheezing or rales.  Abdominal:     General: Bowel sounds are normal.     Palpations: Abdomen is soft. There is no mass.     Tenderness: There is no abdominal tenderness. There is no guarding or rebound.  Musculoskeletal:        General: No tenderness. Normal range of motion.      Cervical back: Full passive range of motion without pain, normal range of motion and neck supple.     Right lower leg: No edema.     Left lower leg: No edema.  Lymphadenopathy:     Cervical: No cervical adenopathy.     Right cervical: No superficial or deep cervical adenopathy.    Left cervical: No superficial or deep cervical adenopathy.  Skin:    General: Skin is warm.     Findings: No rash.  Neurological:     Mental Status: She is alert and oriented to person, place, and time.     Cranial Nerves: No cranial nerve deficit.     Deep Tendon Reflexes: Reflexes normal.  Psychiatric:        Mood and Affect: Mood is not anxious or depressed.     Wt Readings from Last 3 Encounters:  01/09/20 232 lb (105.2 kg)  12/15/19 237 lb (107.5 kg)  07/25/19 238 lb (108 kg)    BP 112/76   Pulse 75   Temp 98.5 F (36.9 C) (Oral)   Ht 5\' 2"  (1.575 m)   Wt 232 lb (105.2 kg)   SpO2 99%   BMI 42.43 kg/m   Assessment and Plan: 1. Acute maxillary sinusitis, recurrence not specified Acute. Persistent. Relatively stable. Exam with tenderness over the maxillary and frontal sinuses along with nasal congestion and purulent nasal discharge consistent with acute sinusitis. Will treat with amoxicillin 500 mg 3 times a day. - amoxicillin (AMOXIL) 500 MG capsule; Take 1 capsule (500 mg total) by mouth 3 (three) times daily.  Dispense: 30 capsule; Refill: 0  2. Cough Persistent. Acute. Nonproductive. Will treat with Robitussin-AC 1 teaspoon every 6 hours as needed cough. - guaiFENesin-codeine (ROBITUSSIN AC) 100-10 MG/5ML syrup; Take 5 mLs by mouth 4 (four) times daily as needed for cough.  Dispense: 118 mL; Refill: 0  3. Seasonal allergic rhinitis due to pollen Episodic. Seasonal. Mild. We will treat with Sudafed 30 mg every 6 to 8 hours as needed nasal sinus congestion.

## 2020-06-21 ENCOUNTER — Encounter: Payer: Self-pay | Admitting: Family Medicine

## 2020-06-21 ENCOUNTER — Ambulatory Visit (INDEPENDENT_AMBULATORY_CARE_PROVIDER_SITE_OTHER): Payer: Medicare HMO | Admitting: Family Medicine

## 2020-06-21 ENCOUNTER — Other Ambulatory Visit: Payer: Self-pay

## 2020-06-21 VITALS — BP 122/82 | HR 80 | Ht 62.0 in | Wt 230.0 lb

## 2020-06-21 DIAGNOSIS — Z23 Encounter for immunization: Secondary | ICD-10-CM

## 2020-06-21 DIAGNOSIS — I10 Essential (primary) hypertension: Secondary | ICD-10-CM | POA: Diagnosis not present

## 2020-06-21 MED ORDER — AMLODIPINE BESY-BENAZEPRIL HCL 5-10 MG PO CAPS: ORAL_CAPSULE | ORAL | 5 refills | Status: DC

## 2020-06-21 NOTE — Progress Notes (Signed)
Date:  06/21/2020   Name:  Shannon Blake   DOB:  [birth year]   MRN:  [id number]   Chief Complaint: Hypertension  Hypertension This is a chronic problem. The current episode started more than 1 year ago. The problem has been gradually improving since onset. The problem is controlled. Pertinent negatives include no anxiety, blurred vision, chest pain, headaches, malaise/fatigue, neck pain, orthopnea, palpitations, peripheral edema, PND, shortness of breath or sweats. There are no associated agents to hypertension. There are no known risk factors for coronary artery disease. Past treatments include ACE inhibitors and calcium channel blockers. The current treatment provides moderate improvement. There are no compliance problems.  There is no history of angina, kidney disease, CAD/MI, CVA, heart failure, left ventricular hypertrophy, PVD or retinopathy. There is no history of chronic renal disease, a hypertension causing med or renovascular disease.    Lab Results  Component Value Date   CREATININE 0.78 12/15/2019   BUN 14 12/15/2019   NA 143 12/15/2019   K 4.9 12/15/2019   CL 106 12/15/2019   CO2 22 12/15/2019   Lab Results  Component Value Date   CHOL 192 06/27/2019   HDL 60 06/27/2019   LDLCALC 119 (H) 06/27/2019   TRIG 70 06/27/2019   CHOLHDL 3.7 01/03/2019   Lab Results  Component Value Date   TSH 2.30 12/28/2013   Lab Results  Component Value Date   HGBA1C 5.8 12/28/2013   Lab Results  Component Value Date   WBC 8.4 01/05/2017   HGB 13.9 01/05/2017   HCT 42.1 01/05/2017   MCV 81.8 01/05/2017   PLT 431 01/05/2017   Lab Results  Component Value Date   ALT 23 01/05/2017   AST 23 01/05/2017   ALKPHOS 79 01/05/2017   BILITOT 0.5 01/05/2017     Review of Systems  Constitutional: Negative.  Negative for chills, fatigue, fever, malaise/fatigue and unexpected weight change.  HENT: Negative for congestion, ear discharge, ear pain, rhinorrhea, sinus pressure, sneezing and  sore throat.   Eyes: Negative for blurred vision, double vision, photophobia, pain, discharge, redness and itching.  Respiratory: Negative for cough, shortness of breath, wheezing and stridor.   Cardiovascular: Negative for chest pain, palpitations, orthopnea and PND.  Gastrointestinal: Negative for abdominal pain, blood in stool, constipation, diarrhea, nausea and vomiting.  Endocrine: Negative for cold intolerance, heat intolerance, polydipsia, polyphagia and polyuria.  Genitourinary: Negative for dysuria, flank pain, frequency, hematuria, menstrual problem, pelvic pain, urgency, vaginal bleeding and vaginal discharge.  Musculoskeletal: Negative for arthralgias, back pain, myalgias and neck pain.  Skin: Negative for rash.  Allergic/Immunologic: Negative for environmental allergies and food allergies.  Neurological: Negative for dizziness, weakness, light-headedness, numbness and headaches.  Hematological: Negative for adenopathy. Does not bruise/bleed easily.  Psychiatric/Behavioral: Negative for dysphoric mood. The patient is not nervous/anxious.     Patient Active Problem List   Diagnosis Date Noted  . BMI 40.0-44.9, adult ([place]) 06/27/2019  . Familial hypercholesterolemia 06/27/2019  . Special screening for malignant neoplasms, colon   . Polyp of sigmoid colon   . Family history of peritoneal cancer 07/20/2017  . S/P total knee arthroplasty 08/27/2015  . Calculus of kidney 10/17/2014  . Alimentary obesity 10/17/2014  . Cough 10/17/2014  . Essential (primary) hypertension 10/17/2014  . Combined fat and carbohydrate induced hyperlipemia 10/17/2014  . Derangement of knee 12/01/2013  . Arthritis of knee, degenerative 12/01/2013    No Known Allergies  Past Surgical History:  Procedure Laterality Date  . CHOLECYSTECTOMY    .  COLONOSCOPY  2007   Dr Candace Cruise  . COLONOSCOPY WITH PROPOFOL N/A 08/11/2017   Procedure: COLONOSCOPY WITH PROPOFOL;  Surgeon: Lucilla Lame, MD;  Location: [facility]  ENDOSCOPY;  Service: Endoscopy;  Laterality: N/A;  . GALLBLADDER SURGERY    . KNEE ARTHROPLASTY Left 08/27/2015   Procedure: COMPUTER ASSISTED TOTAL KNEE ARTHROPLASTY;  Surgeon: Dereck Leep, MD;  Location: [facility] ORS;  Service: Orthopedics;  Laterality: Left;  . meniscal surgery Left     Social History   Tobacco Use  . Smoking status: Never Smoker  . Smokeless tobacco: Never Used  Vaping Use  . Vaping Use: Never used  Substance Use Topics  . Alcohol use: No    Alcohol/week: 0.0 standard drinks  . Drug use: No     Medication list has been reviewed and updated.  Current Meds  Medication Sig  . amLODipine-benazepril (LOTREL) 5-10 MG capsule TAKE 1 CAPSULE BY MOUTH EVERY DAY  . aspirin 81 MG chewable tablet Chew 81 mg by mouth daily.   . Calcium Carb-Cholecalciferol (CALCIUM 600 + D PO) Take 1 tablet by mouth daily.  . Multiple Vitamins-Minerals (CENTRUM SILVER ULTRA WOMENS) TABS Take 1 tablet by mouth daily.    PHQ 2/9 Scores 06/21/2020 01/09/2020 12/15/2019 06/27/2019  PHQ - 2 Score 0 0 0 0  PHQ- 9 Score 0 0 0 0    GAD 7 : Generalized Anxiety Score 01/09/2020 12/15/2019 06/27/2019 06/01/2019  Nervous, Anxious, on Edge 0 0 0 0  Control/stop worrying 0 0 0 0  Worry too much - different things 0 0 0 0  Trouble relaxing 0 0 0 0  Restless 0 0 0 0  Easily annoyed or irritable 0 0 0 0  Afraid - awful might happen 0 0 0 0  Total GAD 7 Score 0 0 0 0    BP Readings from Last 3 Encounters:  06/21/20 122/82  01/09/20 112/76  12/15/19 100/70    Physical Exam Vitals and nursing note reviewed.  Constitutional:      Appearance: She is well-developed and well-nourished.  HENT:     Head: Normocephalic.     Right Ear: Tympanic membrane, ear canal and external ear normal.     Left Ear: Tympanic membrane, ear canal and external ear normal.     Nose: Nose normal.     Mouth/Throat:     Mouth: Oropharynx is clear and moist. Mucous membranes are moist.  Eyes:     General: Lids are everted,  no foreign bodies appreciated. No scleral icterus.       Left eye: No foreign body or hordeolum.     Extraocular Movements: EOM normal.     Conjunctiva/sclera: Conjunctivae normal.     Right eye: Right conjunctiva is not injected.     Left eye: Left conjunctiva is not injected.     Pupils: Pupils are equal, round, and reactive to light.  Neck:     Thyroid: No thyromegaly.     Vascular: No carotid bruit or JVD.     Trachea: No tracheal deviation.  Cardiovascular:     Rate and Rhythm: Normal rate and regular rhythm.     Pulses: Normal pulses and intact distal pulses.     Heart sounds: Normal heart sounds. No murmur heard. No friction rub. No gallop.   Pulmonary:     Effort: Pulmonary effort is normal. No respiratory distress.     Breath sounds: Normal breath sounds. No wheezing, rhonchi or rales.  Abdominal:  General: Bowel sounds are normal.     Palpations: Abdomen is soft. There is no hepatosplenomegaly or mass.     Tenderness: There is no abdominal tenderness. There is no guarding or rebound.  Musculoskeletal:        General: No tenderness or edema. Normal range of motion.     Cervical back: Normal range of motion and neck supple.  Lymphadenopathy:     Cervical: No cervical adenopathy.  Skin:    General: Skin is warm.     Findings: No rash.  Neurological:     Mental Status: She is alert and oriented to person, place, and time.     Cranial Nerves: No cranial nerve deficit.     Deep Tendon Reflexes: Strength normal. Reflexes normal.  Psychiatric:        Mood and Affect: Mood and affect normal. Mood is not anxious or depressed.     Wt Readings from Last 3 Encounters:  06/21/20 230 lb (104.3 kg)  01/09/20 232 lb (105.2 kg)  12/15/19 237 lb (107.5 kg)    BP 122/82   Pulse 80   Ht 5\' 2"  (1.575 m)   Wt 230 lb (104.3 kg)   BMI 42.07 kg/m   Assessment and Plan: 1. Essential (primary) hypertension Chronic. Controlled. Stable. Blood pressure today 122/82. We will  continue combination amlodipine benazepril 5-10 mg daily. Reviewed with previous renal function panel is acceptable and will recheck in 6 months along with patient. - amLODipine-benazepril (LOTREL) 5-10 MG capsule; TAKE 1 CAPSULE BY MOUTH EVERY DAY  Dispense: 30 capsule; Refill: 5  2. Need for immunization against influenza Discussion with patient concerning pneumococcal and influenza vaccines. Patient never had an influenza but she works in the medical setting and it would be prudent that she is covered for this as been suggested by her work in the past. I have been educated that it would be better to get one and we have decided on influenza and we'll do the pneumococcal in 6 months when she returns for her blood pressure recheck. - Flu Vaccine QUAD High Dose(Fluad)

## 2020-07-11 ENCOUNTER — Encounter: Payer: Self-pay | Admitting: Family Medicine

## 2020-07-11 ENCOUNTER — Other Ambulatory Visit: Payer: Self-pay

## 2020-07-11 ENCOUNTER — Other Ambulatory Visit: Payer: Medicare HMO

## 2020-07-11 ENCOUNTER — Telehealth: Payer: Self-pay

## 2020-07-11 ENCOUNTER — Ambulatory Visit (INDEPENDENT_AMBULATORY_CARE_PROVIDER_SITE_OTHER): Payer: Medicare HMO | Admitting: Family Medicine

## 2020-07-11 VITALS — Temp 98.0°F

## 2020-07-11 DIAGNOSIS — U071 COVID-19: Secondary | ICD-10-CM

## 2020-07-11 DIAGNOSIS — R059 Cough, unspecified: Secondary | ICD-10-CM | POA: Diagnosis not present

## 2020-07-11 DIAGNOSIS — Z20822 Contact with and (suspected) exposure to covid-19: Secondary | ICD-10-CM | POA: Diagnosis not present

## 2020-07-11 DIAGNOSIS — J0100 Acute maxillary sinusitis, unspecified: Secondary | ICD-10-CM | POA: Diagnosis not present

## 2020-07-11 MED ORDER — GUAIFENESIN-CODEINE 100-10 MG/5ML PO SYRP: 5.0000 mL | ORAL_SOLUTION | Freq: Four times a day (QID) | ORAL | 0 refills | Status: DC | PRN

## 2020-07-11 MED ORDER — AMOXICILLIN 500 MG PO CAPS: 500.0000 mg | ORAL_CAPSULE | Freq: Three times a day (TID) | ORAL | 0 refills | Status: DC

## 2020-07-11 NOTE — Telephone Encounter (Signed)
Copied from [place] [id number]. Topic: General - Other >> Jul 11, 2020  8:22 AM Celene Kras wrote: Reason for CRM: Pt called stating that she tested positive for covid last night. She states that she has a sore throat and a cough. Pt is declining appt and is requesting to speak with Baxter Flattery. Please advise.

## 2020-07-11 NOTE — Progress Notes (Addendum)
Date:  07/11/2020   Name:  Shannon Blake   DOB:  07/19/1955   MRN:  [id number]   Chief Complaint: vein problems  I connected withthis patient, Shannon Blake, by telephoneat the patient's home.  I verified that I/Physician am speaking with the correct person using two identifiers. This visit was conducted via telephone due to the Covid-19 outbreak from my office at [facility] in [place], [place]. I discussed the limitations, risks, security and privacy concerns of performing an evaluation and management service by telephone. I also discussed with the patient that there may be a patient responsible charge related to this service. The patient expressed understanding and agreed to proceed.  Sore Throat  This is a new problem. The current episode started in the past 7 days. The problem has been gradually worsening. Neither side of throat is experiencing more pain than the other. There has been no fever. The pain is moderate. Associated symptoms include congestion and coughing. Pertinent negatives include no abdominal pain, diarrhea, drooling, ear discharge, ear pain, headaches, hoarse voice, plugged ear sensation, neck pain, shortness of breath, stridor, swollen glands, trouble swallowing or vomiting. She has tried nothing for the symptoms.  Cough This is a new problem. The current episode started in the past 7 days. The problem has been gradually worsening. The cough is productive of purulent sputum. Associated symptoms include nasal congestion, postnasal drip, rhinorrhea and a sore throat. Pertinent negatives include no chills, ear congestion, ear pain, eye redness, fever, headaches, myalgias, rash, shortness of breath or wheezing. The treatment provided moderate relief. There is no history of environmental allergies.  Sinusitis This is a new problem. The current episode started in the past 7 days. The problem has been gradually worsening since onset. There has been no fever. The pain is moderate.  Associated symptoms include congestion, coughing, sinus pressure and a sore throat. Pertinent negatives include no chills, ear pain, headaches, hoarse voice, neck pain, shortness of breath, sneezing or swollen glands. Past treatments include acetaminophen. The treatment provided mild relief.    Lab Results  Component Value Date   CREATININE 0.78 12/15/2019   BUN 14 12/15/2019   NA 143 12/15/2019   K 4.9 12/15/2019   CL 106 12/15/2019   CO2 22 12/15/2019   Lab Results  Component Value Date   CHOL 192 06/27/2019   HDL 60 06/27/2019   LDLCALC 119 (H) 06/27/2019   TRIG 70 06/27/2019   CHOLHDL 3.7 01/03/2019   Lab Results  Component Value Date   TSH 2.30 12/28/2013   Lab Results  Component Value Date   HGBA1C 5.8 12/28/2013   Lab Results  Component Value Date   WBC 8.4 01/05/2017   HGB 13.9 01/05/2017   HCT 42.1 01/05/2017   MCV 81.8 01/05/2017   PLT 431 01/05/2017   Lab Results  Component Value Date   ALT 23 01/05/2017   AST 23 01/05/2017   ALKPHOS 79 01/05/2017   BILITOT 0.5 01/05/2017     Review of Systems  Constitutional: Negative.  Negative for chills, fatigue, fever and unexpected weight change.  HENT: Positive for congestion, postnasal drip, rhinorrhea, sinus pressure and sore throat. Negative for drooling, ear discharge, ear pain, hoarse voice, sneezing and trouble swallowing.   Eyes: Negative for double vision, photophobia, pain, discharge, redness and itching.  Respiratory: Positive for cough. Negative for shortness of breath, wheezing and stridor.   Gastrointestinal: Negative for abdominal pain, blood in stool, constipation, diarrhea, nausea and vomiting.  Endocrine: Negative for cold intolerance, heat intolerance, polydipsia, polyphagia and polyuria.  Genitourinary: Negative for dysuria, flank pain, frequency, hematuria, menstrual problem, pelvic pain, urgency, vaginal bleeding and vaginal discharge.  Musculoskeletal: Negative for arthralgias, back pain,  myalgias and neck pain.  Skin: Negative for rash.  Allergic/Immunologic: Negative for environmental allergies and food allergies.  Neurological: Negative for dizziness, weakness, light-headedness, numbness and headaches.  Hematological: Negative for adenopathy. Does not bruise/bleed easily.  Psychiatric/Behavioral: Negative for dysphoric mood. The patient is not nervous/anxious.     Patient Active Problem List   Diagnosis Date Noted  . BMI 40.0-44.9, adult ([place]) 06/27/2019  . Familial hypercholesterolemia 06/27/2019  . Special screening for malignant neoplasms, colon   . Polyp of sigmoid colon   . Family history of peritoneal cancer 07/20/2017  . S/P total knee arthroplasty 08/27/2015  . Calculus of kidney 10/17/2014  . Alimentary obesity 10/17/2014  . Cough 10/17/2014  . Essential (primary) hypertension 10/17/2014  . Combined fat and carbohydrate induced hyperlipemia 10/17/2014  . Derangement of knee 12/01/2013  . Arthritis of knee, degenerative 12/01/2013    No Known Allergies  Past Surgical History:  Procedure Laterality Date  . CHOLECYSTECTOMY    . COLONOSCOPY  2007   Dr Candace Cruise  . COLONOSCOPY WITH PROPOFOL N/A 08/11/2017   Procedure: COLONOSCOPY WITH PROPOFOL;  Surgeon: Lucilla Lame, MD;  Location: [facility] ENDOSCOPY;  Service: Endoscopy;  Laterality: N/A;  . GALLBLADDER SURGERY    . KNEE ARTHROPLASTY Left 08/27/2015   Procedure: COMPUTER ASSISTED TOTAL KNEE ARTHROPLASTY;  Surgeon: Dereck Leep, MD;  Location: [facility] ORS;  Service: Orthopedics;  Laterality: Left;  . meniscal surgery Left     Social History   Tobacco Use  . Smoking status: Never Smoker  . Smokeless tobacco: Never Used  Vaping Use  . Vaping Use: Never used  Substance Use Topics  . Alcohol use: No    Alcohol/week: 0.0 standard drinks  . Drug use: No     Medication list has been reviewed and updated.  No outpatient medications have been marked as taking for the 07/11/20 encounter (Office Visit) with Juline Patch, MD.    [facility] 2/9 Scores 06/21/2020 01/09/2020 12/15/2019 06/27/2019  PHQ - 2 Score 0 0 0 0  PHQ- 9 Score 0 0 0 0    GAD 7 : Generalized Anxiety Score 01/09/2020 12/15/2019 06/27/2019 06/01/2019  Nervous, Anxious, on Edge 0 0 0 0  Control/stop worrying 0 0 0 0  Worry too much - different things 0 0 0 0  Trouble relaxing 0 0 0 0  Restless 0 0 0 0  Easily annoyed or irritable 0 0 0 0  Afraid - awful might happen 0 0 0 0  Total GAD 7 Score 0 0 0 0    BP Readings from Last 3 Encounters:  06/21/20 122/82  01/09/20 112/76  12/15/19 100/70    Physical Exam Nursing note reviewed.  HENT:     Head: Normocephalic.     Nose:     Right Sinus: Maxillary sinus tenderness and frontal sinus tenderness present.     Left Sinus: Maxillary sinus tenderness and frontal sinus tenderness present.     Wt Readings from Last 3 Encounters:  06/21/20 230 lb (104.3 kg)  01/09/20 232 lb (105.2 kg)  12/15/19 237 lb (107.5 kg)    There were no vitals taken for this visit.  Assessment and Plan: I spent 10 minutes with this patient, More than 50% of that time was spent in  face to face education, counseling and care coordination.  During this time we discussed pertinent symptoms and signs of Covid infection.  We went over circumstances that would require further evaluation if symptoms were to worsen.  It was discussed with patient that she may need to have a verification of her Covid and information was given and telephone number given for her to call for an appointment for Covid testing at the designated site.  Questions were encouraged and answered.  1. Acute COVID-19 New onset.  Persistent.  Stable.  Symptoms at work were presented with cough and sore throat after which she was checked at work for [employer] and she tested positive.  After completing her shift she was sent home for 7 days.  It was discussed with patient her symptoms and concerns to watch for if this should be worsening as well as maintaining  fluid status and resting and staying isolated and circumstances pertaining to this.  2. Acute maxillary sinusitis, recurrence not specified New onset.  Persistent.  Patient does have tenderness over her frontal and maxillary sinuses.  We will treat with amoxicillin 500 mg 3 times a day for 10 days. - amoxicillin (AMOXIL) 500 MG capsule; Take 1 capsule (500 mg total) by mouth 3 (three) times daily.  Dispense: 30 capsule; Refill: 0  3. Cough She has a persistent cough that is affecting her sleep pattern.  We will prescribe Robitussin-AC 1 teaspoon every 6 hours as needed cough. - guaiFENesin-codeine (ROBITUSSIN AC) 100-10 MG/5ML syrup; Take 5 mLs by mouth 4 (four) times daily as needed for cough.  Dispense: 118 mL; Refill: 0

## 2020-07-11 NOTE — Telephone Encounter (Signed)
Saw pt today V V

## 2020-07-11 NOTE — Patient Instructions (Addendum)

## 2020-07-12 LAB — NOVEL CORONAVIRUS, NAA: SARS-CoV-2, NAA: DETECTED — AB

## 2020-07-12 LAB — SARS-COV-2, NAA 2 DAY TAT

## 2020-07-13 ENCOUNTER — Telehealth: Payer: Self-pay | Admitting: Nurse Practitioner

## 2020-07-13 ENCOUNTER — Telehealth (HOSPITAL_COMMUNITY): Payer: Self-pay | Admitting: Family

## 2020-07-13 NOTE — Telephone Encounter (Signed)
Called to Discuss with patient about Covid symptoms and the use of the monoclonal antibody/antiviral infusion or oral treatment for those with mild to moderate Covid symptoms and at a high risk of hospitalization.     Pt appears to qualify for this infusion due to co-morbid conditions and/or a member of an at-risk group in accordance with the FDA Emergency Use Authorization.    Patient declines treatment.   Alda Lea, NP [facility]  [phone]

## 2020-07-13 NOTE — Telephone Encounter (Signed)
Called to discuss with patient about COVID-19 symptoms and the use of one of the available treatments for those with mild to moderate Covid symptoms and at a high risk of hospitalization.  Pt appears to qualify for outpatient treatment due to co-morbid conditions and/or a member of an at-risk group in accordance with the FDA Emergency Use Authorization.    Unable to reach pt - VM left  Lorik Guo   

## 2020-07-25 NOTE — Progress Notes (Signed)
PCP: Juline Patch, MD   Chief Complaint  Patient presents with  . Gynecologic Exam    No concerns    HPI:      [name] is a 65 y.o. G1P1001 who LMP was No LMP recorded. Patient is postmenopausal., presents today for her Medicare annual examination.  Her menses are absent due to menopause.  She does not have PMB. She does not have vasomotor sx.   Sex activity: occas sexually active. She does not have vaginal dryness.  Last Pap: 07/22/18  Results were: no abnormalities /neg HPV DNA.  Hx of STDs: none  Last mammogram: 10/28/19 Results were: normal--routine follow-up in 12 months There is a FH of breast cancer in her mat aunt, genetic testing not indicated. There is a FH of ovarian/peritoneal cancer in her sister. Sister was [facility] neg through [facility] 6/16. The patient does do self-breast exams.  Colonoscopy: colonoscopy 2019 without abnormalities.  Repeat due after 10 years.  DEXA: due, PCP asked that we sched with mammo  Tobacco use: The patient denies current or previous tobacco use. Alcohol use: none  No drug use Exercise: moderately active  She does get adequate calcium and Vitamin D in her diet.  Labs with PCP.   Past Medical History:  Diagnosis Date  . Arthritis   . Family history of ovarian cancer    Pt's affected sister is [facility] neg  . Hypertension     Past Surgical History:  Procedure Laterality Date  . CHOLECYSTECTOMY    . COLONOSCOPY  2007   Dr Candace Cruise  . COLONOSCOPY WITH PROPOFOL N/A 08/11/2017   Procedure: COLONOSCOPY WITH PROPOFOL;  Surgeon: Lucilla Lame, MD;  Location: [facility] ENDOSCOPY;  Service: Endoscopy;  Laterality: N/A;  . GALLBLADDER SURGERY    . KNEE ARTHROPLASTY Left 08/27/2015   Procedure: COMPUTER ASSISTED TOTAL KNEE ARTHROPLASTY;  Surgeon: Dereck Leep, MD;  Location: [facility] ORS;  Service: Orthopedics;  Laterality: Left;  . meniscal surgery Left     Family History  Problem Relation Age of Onset  . Hypertension Mother   .  Hypertension Father   . Cancer Sister 82       peritoneal/ovarian cancer; [facility] neg at [facility] oncology 6/16  . Breast cancer Maternal Aunt 88  . Thyroid cancer Maternal Aunt        gland    Social History   Socioeconomic History  . Marital status: Single    Spouse name: Not on file  . Number of children: Not on file  . Years of education: Not on file  . Highest education level: Not on file  Occupational History  . Occupation: Optician, dispensing  Tobacco Use  . Smoking status: Never Smoker  . Smokeless tobacco: Never Used  Vaping Use  . Vaping Use: Never used  Substance and Sexual Activity  . Alcohol use: No    Alcohol/week: 0.0 standard drinks  . Drug use: No  . Sexual activity: Yes    Birth control/protection: Post-menopausal  Other Topics Concern  . Not on file  Social History Narrative  . Not on file   Social Determinants of Health   Financial Resource Strain: Not on file  Food Insecurity: Not on file  Transportation Needs: Not on file  Physical Activity: Not on file  Stress: Not on file  Social Connections: Not on file  Intimate Partner Violence: Not on file    Current Meds  Medication Sig  . amLODipine-benazepril (LOTREL) 5-10 MG capsule TAKE 1  CAPSULE BY MOUTH EVERY DAY  . aspirin 81 MG chewable tablet Chew 81 mg by mouth daily.   . Calcium Carb-Cholecalciferol (CALCIUM 600 + D PO) Take 1 tablet by mouth daily.  . Multiple Vitamins-Minerals (CENTRUM SILVER ULTRA WOMENS) TABS Take 1 tablet by mouth daily.      ROS:  Review of Systems  Constitutional: Negative for fatigue, fever and unexpected weight change.  Respiratory: Negative for cough, shortness of breath and wheezing.   Cardiovascular: Negative for chest pain, palpitations and leg swelling.  Gastrointestinal: Negative for blood in stool, constipation, diarrhea, nausea and vomiting.  Endocrine: Negative for cold intolerance, heat intolerance and polyuria.  Genitourinary: Negative for dyspareunia,  dysuria, flank pain, frequency, genital sores, hematuria, menstrual problem, pelvic pain, urgency, vaginal bleeding, vaginal discharge and vaginal pain.  Musculoskeletal: Negative for back pain, joint swelling and myalgias.  Skin: Negative for rash.  Neurological: Negative for dizziness, syncope, light-headedness, numbness and headaches.  Hematological: Negative for adenopathy.  Psychiatric/Behavioral: Negative for agitation, confusion, sleep disturbance and suicidal ideas. The patient is not nervous/anxious.      Objective: BP (!) 140/100   Ht 5\' 2"  (1.575 m)   Wt 233 lb (105.7 kg)   BMI 42.62 kg/m    Physical Exam Constitutional:      Appearance: She is well-developed.  Genitourinary:     Vulva normal.     Right Labia: No rash, tenderness or lesions.    Left Labia: No tenderness, lesions or rash.    No vaginal discharge, erythema or tenderness.      Right Adnexa: not tender and no mass present.    Left Adnexa: not tender and no mass present.    No cervical motion tenderness, friability or polyp.     Uterus is not enlarged or tender.  Breasts:     Right: No mass, nipple discharge, skin change or tenderness.     Left: No mass, nipple discharge, skin change or tenderness.    Neck:     Thyroid: No thyromegaly.  Cardiovascular:     Rate and Rhythm: Normal rate and regular rhythm.     Heart sounds: Normal heart sounds. No murmur heard.   Pulmonary:     Effort: Pulmonary effort is normal.     Breath sounds: Normal breath sounds.  Abdominal:     Palpations: Abdomen is soft.     Tenderness: There is no abdominal tenderness. There is no guarding or rebound.  Musculoskeletal:        General: Normal range of motion.     Cervical back: Normal range of motion.  Lymphadenopathy:     Cervical: No cervical adenopathy.  Neurological:     General: No focal deficit present.     Mental Status: She is alert and oriented to person, place, and time.     Cranial Nerves: No cranial  nerve deficit.  Skin:    General: Skin is warm and dry.  Psychiatric:        Mood and Affect: Mood normal.        Behavior: Behavior normal.        Thought Content: Thought content normal.        Judgment: Judgment normal.  Vitals reviewed.     Assessment/Plan: Encounter for annual routine gynecological examination  Encounter for screening mammogram for malignant neoplasm of breast - Plan: MM 3D SCREEN BREAST BILATERAL; we'll sched mammo with DEXA  Screening for osteoporosis - Plan: DG Bone Density  GYN counsel breast self exam, mammography screening, menopause, adequate intake of calcium and vitamin D, diet and exercise    F/U  Return in about 2 years (around 07/26/2022).  Eileene Kisling B. Analese Sovine, PA-C 07/26/2020 10:23 AM

## 2020-07-26 ENCOUNTER — Other Ambulatory Visit: Payer: Self-pay

## 2020-07-26 ENCOUNTER — Encounter: Payer: Self-pay | Admitting: Obstetrics and Gynecology

## 2020-07-26 ENCOUNTER — Ambulatory Visit (INDEPENDENT_AMBULATORY_CARE_PROVIDER_SITE_OTHER): Payer: Medicare HMO | Admitting: Obstetrics and Gynecology

## 2020-07-26 VITALS — BP 140/90 | Ht 62.0 in | Wt 233.0 lb

## 2020-07-26 DIAGNOSIS — Z13820 Encounter for screening for osteoporosis: Secondary | ICD-10-CM

## 2020-07-26 DIAGNOSIS — Z01419 Encounter for gynecological examination (general) (routine) without abnormal findings: Secondary | ICD-10-CM | POA: Diagnosis not present

## 2020-07-26 DIAGNOSIS — Z1231 Encounter for screening mammogram for malignant neoplasm of breast: Secondary | ICD-10-CM

## 2020-07-26 NOTE — Patient Instructions (Addendum)
I value your feedback and you entrusting us with your care. If you get a [facility] patient survey, I would appreciate you taking the time to let us know about your experience today. Thank you!  [facility]: [phone]      

## 2020-09-06 DIAGNOSIS — Z6841 Body Mass Index (BMI) 40.0 and over, adult: Secondary | ICD-10-CM | POA: Diagnosis not present

## 2020-09-06 DIAGNOSIS — Z96652 Presence of left artificial knee joint: Secondary | ICD-10-CM | POA: Diagnosis not present

## 2020-10-29 ENCOUNTER — Other Ambulatory Visit: Payer: Self-pay

## 2020-10-29 ENCOUNTER — Ambulatory Visit
Admission: RE | Admit: 2020-10-29 | Discharge: 2020-10-29 | Disposition: A | Discharge status: Home / Self Care | Payer: Medicare HMO | Source: Ambulatory Visit | Attending: Obstetrics and Gynecology | Admitting: Obstetrics and Gynecology

## 2020-10-29 DIAGNOSIS — M85851 Other specified disorders of bone density and structure, right thigh: Secondary | ICD-10-CM | POA: Diagnosis not present

## 2020-10-29 DIAGNOSIS — Z13820 Encounter for screening for osteoporosis: Secondary | ICD-10-CM

## 2020-10-29 DIAGNOSIS — Z1231 Encounter for screening mammogram for malignant neoplasm of breast: Secondary | ICD-10-CM | POA: Insufficient documentation

## 2020-10-29 DIAGNOSIS — Z780 Asymptomatic menopausal state: Secondary | ICD-10-CM | POA: Insufficient documentation

## 2020-12-06 ENCOUNTER — Encounter: Payer: Self-pay | Admitting: Family Medicine

## 2020-12-06 ENCOUNTER — Other Ambulatory Visit: Payer: Self-pay

## 2020-12-06 ENCOUNTER — Ambulatory Visit (INDEPENDENT_AMBULATORY_CARE_PROVIDER_SITE_OTHER): Payer: Medicare HMO | Admitting: Family Medicine

## 2020-12-06 VITALS — BP 110/64 | HR 80 | Ht 62.0 in | Wt 228.0 lb

## 2020-12-06 DIAGNOSIS — E7801 Familial hypercholesterolemia: Secondary | ICD-10-CM

## 2020-12-06 DIAGNOSIS — I10 Essential (primary) hypertension: Secondary | ICD-10-CM

## 2020-12-06 MED ORDER — AMLODIPINE BESY-BENAZEPRIL HCL 5-10 MG PO CAPS: ORAL_CAPSULE | ORAL | 1 refills | Status: DC

## 2020-12-06 NOTE — Progress Notes (Signed)
Date:  12/06/2020   Name:  Shannon Blake   DOB:  03-03-1956   MRN:  [id number]   Chief Complaint: Hypertension  Hypertension This is a chronic problem. The current episode started more than 1 year ago. The problem has been gradually improving since onset. The problem is controlled. Pertinent negatives include no anxiety, blurred vision, chest pain, headaches, malaise/fatigue, neck pain, orthopnea, palpitations, peripheral edema, PND, shortness of breath or sweats. There are no associated agents to hypertension. There are no known risk factors for coronary artery disease. Past treatments include ACE inhibitors and calcium channel blockers. The current treatment provides moderate improvement. There is no history of angina, kidney disease, CAD/MI, CVA, heart failure, left ventricular hypertrophy, PVD or retinopathy. There is no history of chronic renal disease, a hypertension causing med or renovascular disease.   Lab Results  Component Value Date   CREATININE 0.78 12/15/2019   BUN 14 12/15/2019   NA 143 12/15/2019   K 4.9 12/15/2019   CL 106 12/15/2019   CO2 22 12/15/2019   Lab Results  Component Value Date   CHOL 192 06/27/2019   HDL 60 06/27/2019   LDLCALC 119 (H) 06/27/2019   TRIG 70 06/27/2019   CHOLHDL 3.7 01/03/2019   Lab Results  Component Value Date   TSH 2.30 12/28/2013   Lab Results  Component Value Date   HGBA1C 5.8 12/28/2013   Lab Results  Component Value Date   WBC 8.4 01/05/2017   HGB 13.9 01/05/2017   HCT 42.1 01/05/2017   MCV 81.8 01/05/2017   PLT 431 01/05/2017   Lab Results  Component Value Date   ALT 23 01/05/2017   AST 23 01/05/2017   ALKPHOS 79 01/05/2017   BILITOT 0.5 01/05/2017     Review of Systems  Constitutional:  Negative for chills, fever and malaise/fatigue.  HENT:  Negative for drooling, ear discharge, ear pain and sore throat.   Eyes:  Negative for blurred vision.  Respiratory:  Negative for cough, shortness of breath and  wheezing.   Cardiovascular:  Negative for chest pain, palpitations, orthopnea, leg swelling and PND.  Gastrointestinal:  Negative for abdominal pain, blood in stool, constipation, diarrhea and nausea.  Endocrine: Negative for polydipsia.  Genitourinary:  Negative for dysuria, frequency, hematuria and urgency.  Musculoskeletal:  Negative for back pain, myalgias and neck pain.  Skin:  Negative for rash.  Allergic/Immunologic: Negative for environmental allergies.  Neurological:  Negative for dizziness and headaches.  Hematological:  Does not bruise/bleed easily.  Psychiatric/Behavioral:  Negative for suicidal ideas. The patient is not nervous/anxious.    Patient Active Problem List   Diagnosis Date Noted   BMI 40.0-44.9, adult ([place]) 06/27/2019   Familial hypercholesterolemia 06/27/2019   Special screening for malignant neoplasms, colon    Polyp of sigmoid colon    Family history of peritoneal cancer 07/20/2017   S/P total knee arthroplasty 08/27/2015   Calculus of kidney 10/17/2014   Alimentary obesity 10/17/2014   Cough 10/17/2014   Essential (primary) hypertension 10/17/2014   Combined fat and carbohydrate induced hyperlipemia 10/17/2014   Derangement of knee 12/01/2013   Arthritis of knee, degenerative 12/01/2013    No Known Allergies  Past Surgical History:  Procedure Laterality Date   CHOLECYSTECTOMY     COLONOSCOPY  2007   Dr Candace Cruise   COLONOSCOPY WITH PROPOFOL N/A 08/11/2017   Procedure: COLONOSCOPY WITH PROPOFOL;  Surgeon: Lucilla Lame, MD;  Location: [facility] END[facility]COPY;  Service: Endoscopy;  Laterality: N/A;  GALLBLADDER SURGERY     KNEE ARTHROPLASTY Left 08/27/2015   Procedure: COMPUTER ASSISTED TOTAL KNEE ARTHROPLASTY;  Surgeon: Dereck Leep, MD;  Location: [facility] ORS;  Service: Orthopedics;  Laterality: Left;   meniscal surgery Left     Social History   Tobacco Use   Smoking status: Never   Smokeless tobacco: Never  Vaping Use   Vaping Use: Never used  Substance Use  Topics   Alcohol use: No    Alcohol/week: 0.0 standard drinks   Drug use: No     Medication list has been reviewed and updated.  Current Meds  Medication Sig   amLODipine-benazepril (LOTREL) 5-10 MG capsule TAKE 1 CAPSULE BY MOUTH EVERY DAY   aspirin 81 MG chewable tablet Chew 81 mg by mouth daily.    Calcium Carb-Cholecalciferol (CALCIUM 600 + D PO) Take 1 tablet by mouth daily.   Multiple Vitamins-Minerals (CENTRUM SILVER ULTRA WOMENS) TABS Take 1 tablet by mouth daily.    PHQ 2/9 Scores 12/06/2020 07/11/2020 06/21/2020 01/09/2020  PHQ - 2 Score 0 0 0 0  PHQ- 9 Score 0 0 0 0    GAD 7 : Generalized Anxiety Score 12/06/2020 07/11/2020 01/09/2020 12/15/2019  Nervous, Anxious, on Edge 0 0 0 0  Control/stop worrying 0 0 0 0  Worry too much - different things 0 0 0 0  Trouble relaxing 0 0 0 0  Restless 0 0 0 0  Easily annoyed or irritable 0 0 0 0  Afraid - awful might happen 0 0 0 0  Total GAD 7 Score 0 0 0 0    BP Readings from Last 3 Encounters:  12/06/20 110/64  07/26/20 140/90  06/21/20 122/82    Physical Exam Vitals reviewed.  Constitutional:      Appearance: She is well-developed.  HENT:     Head: Normocephalic.     Right Ear: Tympanic membrane, ear canal and external ear normal. There is no impacted cerumen.     Left Ear: Tympanic membrane, ear canal and external ear normal. There is no impacted cerumen.     Nose: Nose normal.  Eyes:     General: Lids are everted, no foreign bodies appreciated. No scleral icterus.       Left eye: No foreign body or hordeolum.     Conjunctiva/sclera: Conjunctivae normal.     Right eye: Right conjunctiva is not injected.     Left eye: Left conjunctiva is not injected.     Pupils: Pupils are equal, round, and reactive to light.  Neck:     Thyroid: No thyromegaly.     Vascular: No JVD.     Trachea: No tracheal deviation.  Cardiovascular:     Rate and Rhythm: Normal rate and regular rhythm.     Pulses: Normal pulses.     Heart sounds:  Normal heart sounds. No murmur heard.   No friction rub. No gallop.  Pulmonary:     Effort: Pulmonary effort is normal. No respiratory distress.     Breath sounds: Normal breath sounds. No wheezing, rhonchi or rales.  Abdominal:     General: Bowel sounds are normal.     Palpations: Abdomen is soft. There is no mass.     Tenderness: There is no abdominal tenderness. There is no guarding or rebound.  Musculoskeletal:        General: No tenderness. Normal range of motion.     Cervical back: Normal range of motion and neck supple.  Lymphadenopathy:  Cervical: No cervical adenopathy.  Skin:    General: Skin is warm.     Findings: No rash.  Neurological:     Mental Status: She is alert and oriented to person, place, and time.     Cranial Nerves: No cranial nerve deficit.     Deep Tendon Reflexes: Reflexes normal.  Psychiatric:        Mood and Affect: Mood is not anxious or depressed.    Wt Readings from Last 3 Encounters:  12/06/20 228 lb (103.4 kg)  07/26/20 233 lb (105.7 kg)  06/21/20 230 lb (104.3 kg)    BP 110/64   Pulse 80   Ht 5\' 2"  (1.575 m)   Wt 228 lb (103.4 kg)   BMI 41.70 kg/m   Assessment and Plan:  1. Essential (primary) hypertension Chronic.  Controlled.  Stable.  Blood pressure 110/64.  We will continue amlodipine benazepril 5-10 mg once a day.  Will check renal function panel for electrolytes. - amLODipine-benazepril (LOTREL) 5-10 MG capsule; TAKE 1 CAPSULE BY MOUTH EVERY DAY  Dispense: 90 capsule; Refill: 1 - Renal Function Panel - Lipid Panel With LDL/HDL Ratio  2. Familial hypercholesterolemia Chronic.  Controlled.  Stable.  Patient is doing well with weight loss having lost 5 pounds over the past 4 months.  I have encouraged her to continue to do so and patient is very excited to be able to limit her calories and see results.  We will check a lipid panel for current status - Lipid Panel With LDL/HDL Ratio

## 2020-12-07 LAB — LIPID PANEL WITH LDL/HDL RATIO
Cholesterol, Total: 203 mg/dL — ABNORMAL HIGH (ref 100–199)
HDL: 56 mg/dL (ref >39)
LDL Chol Calc (NIH): 133 mg/dL — ABNORMAL HIGH (ref 0–99)
LDL/HDL Ratio: 2.4 ratio (ref 0.0–3.2)
Triglycerides: 79 mg/dL (ref 0–149)
VLDL Cholesterol Cal: 14 mg/dL (ref 5–40)

## 2020-12-07 LAB — RENAL FUNCTION PANEL
Albumin: 4.6 g/dL (ref 3.8–4.8)
BUN/Creatinine Ratio: 23 (ref 12–28)
BUN: 18 mg/dL (ref 8–27)
CO2: 22 mmol/L (ref 20–29)
Calcium: 10.3 mg/dL (ref 8.7–10.3)
Chloride: 104 mmol/L (ref 96–106)
Creatinine, Ser: 0.8 mg/dL (ref 0.57–1.00)
Glucose: 87 mg/dL (ref 65–99)
Phosphorus: 4.2 mg/dL (ref 3.0–4.3)
Potassium: 4.7 mmol/L (ref 3.5–5.2)
Sodium: 141 mmol/L (ref 134–144)
eGFR: 82 mL/min/{1.73_m2} (ref >59)

## 2021-06-11 ENCOUNTER — Other Ambulatory Visit: Payer: Self-pay

## 2021-06-11 ENCOUNTER — Encounter: Payer: Self-pay | Admitting: Family Medicine

## 2021-06-11 ENCOUNTER — Ambulatory Visit (INDEPENDENT_AMBULATORY_CARE_PROVIDER_SITE_OTHER): Payer: Medicare HMO | Admitting: Family Medicine

## 2021-06-11 VITALS — BP 120/80 | HR 60 | Ht 62.0 in | Wt 232.0 lb

## 2021-06-11 DIAGNOSIS — I10 Essential (primary) hypertension: Secondary | ICD-10-CM

## 2021-06-11 DIAGNOSIS — Z6841 Body Mass Index (BMI) 40.0 and over, adult: Secondary | ICD-10-CM | POA: Diagnosis not present

## 2021-06-11 DIAGNOSIS — E7801 Familial hypercholesterolemia: Secondary | ICD-10-CM

## 2021-06-11 DIAGNOSIS — Z23 Encounter for immunization: Secondary | ICD-10-CM

## 2021-06-11 MED ORDER — AMLODIPINE BESY-BENAZEPRIL HCL 5-10 MG PO CAPS: ORAL_CAPSULE | ORAL | 1 refills | Status: DC

## 2021-06-11 NOTE — Patient Instructions (Signed)

## 2021-06-11 NOTE — Progress Notes (Signed)
Date:  06/11/2021   Name:  Shannon Blake   DOB:  1955/06/20   MRN:  [id number]   Chief Complaint: Hypertension and pneumonia vacc need  Hypertension This is a chronic problem. The current episode started more than 1 year ago. The problem has been gradually improving since onset. The problem is controlled. Pertinent negatives include no anxiety, blurred vision, chest pain, headaches, malaise/fatigue, neck pain, orthopnea, palpitations, peripheral edema, PND, shortness of breath or sweats. There are no associated agents to hypertension. There are no known risk factors for coronary artery disease. Past treatments include ACE inhibitors and calcium channel blockers. The current treatment provides moderate improvement. There are no compliance problems.   Hyperlipidemia This is a chronic problem. The current episode started more than 1 year ago. The problem is uncontrolled. Recent lipid tests were reviewed and are variable. Exacerbating diseases include obesity. Pertinent negatives include no chest pain, focal sensory loss, focal weakness, leg pain, myalgias or shortness of breath. Current antihyperlipidemic treatment includes diet change. The current treatment provides moderate improvement of lipids. There are no compliance problems.  Risk factors for coronary artery disease include dyslipidemia and hypertension.   Lab Results  Component Value Date   NA 141 12/06/2020   K 4.7 12/06/2020   CO2 22 12/06/2020   GLUCOSE 87 12/06/2020   BUN 18 12/06/2020   CREATININE 0.80 12/06/2020   CALCIUM 10.3 12/06/2020   EGFR 82 12/06/2020   GFRNONAA 81 12/15/2019   Lab Results  Component Value Date   CHOL 203 (H) 12/06/2020   HDL 56 12/06/2020   LDLCALC 133 (H) 12/06/2020   TRIG 79 12/06/2020   CHOLHDL 3.7 01/03/2019   Lab Results  Component Value Date   TSH 2.30 12/28/2013   Lab Results  Component Value Date   HGBA1C 5.8 12/28/2013   Lab Results  Component Value Date   WBC 8.4 01/05/2017    HGB 13.9 01/05/2017   HCT 42.1 01/05/2017   MCV 81.8 01/05/2017   PLT 431 01/05/2017   Lab Results  Component Value Date   ALT 23 01/05/2017   AST 23 01/05/2017   ALKPHOS 79 01/05/2017   BILITOT 0.5 01/05/2017   No results found for: 25OHVITD2, 25OHVITD3, VD25OH   Review of Systems  Constitutional:  Negative for chills, fever and malaise/fatigue.  HENT:  Negative for drooling, ear discharge, ear pain and sore throat.   Eyes:  Negative for blurred vision.  Respiratory:  Negative for cough, shortness of breath and wheezing.   Cardiovascular:  Negative for chest pain, palpitations, orthopnea, leg swelling and PND.  Gastrointestinal:  Negative for abdominal pain, blood in stool, constipation, diarrhea and nausea.  Endocrine: Negative for polydipsia.  Genitourinary:  Negative for dysuria, frequency, hematuria and urgency.  Musculoskeletal:  Negative for back pain, myalgias and neck pain.  Skin:  Negative for rash.  Allergic/Immunologic: Negative for environmental allergies.  Neurological:  Negative for dizziness, focal weakness and headaches.  Hematological:  Does not bruise/bleed easily.  Psychiatric/Behavioral:  Negative for suicidal ideas. The patient is not nervous/anxious.    Patient Active Problem List   Diagnosis Date Noted   BMI 40.0-44.9, adult ([place]) 06/27/2019   Familial hypercholesterolemia 06/27/2019   Special screening for malignant neoplasms, colon    Polyp of sigmoid colon    Family history of peritoneal cancer 07/20/2017   S/P total knee arthroplasty 08/27/2015   Calculus of kidney 10/17/2014   Alimentary obesity 10/17/2014   Cough 10/17/2014   Essential (primary)  hypertension 10/17/2014   Combined fat and carbohydrate induced hyperlipemia 10/17/2014   Derangement of knee 12/01/2013   Arthritis of knee, degenerative 12/01/2013    No Known Allergies  Past Surgical History:  Procedure Laterality Date   CHOLECYSTECTOMY     COLONOSCOPY  2007   Dr Candace Cruise    COLONOSCOPY WITH PROPOFOL N/A 08/11/2017   Procedure: COLONOSCOPY WITH PROPOFOL;  Surgeon: Lucilla Lame, MD;  Location: [facility] ENDOSCOPY;  Service: Endoscopy;  Laterality: N/A;   GALLBLADDER SURGERY     KNEE ARTHROPLASTY Left 08/27/2015   Procedure: COMPUTER ASSISTED TOTAL KNEE ARTHROPLASTY;  Surgeon: Dereck Leep, MD;  Location: [facility] ORS;  Service: Orthopedics;  Laterality: Left;   meniscal surgery Left     Social History   Tobacco Use   Smoking status: Never   Smokeless tobacco: Never  Vaping Use   Vaping Use: Never used  Substance Use Topics   Alcohol use: No    Alcohol/week: 0.0 standard drinks   Drug use: No     Medication list has been reviewed and updated.  Current Meds  Medication Sig   amLODipine-benazepril (LOTREL) 5-10 MG capsule TAKE 1 CAPSULE BY MOUTH EVERY DAY   aspirin 81 MG chewable tablet Chew 81 mg by mouth daily.    Calcium Carb-Cholecalciferol (CALCIUM 600 + D PO) Take 1 tablet by mouth daily.   Multiple Vitamins-Minerals (CENTRUM SILVER ULTRA WOMENS) TABS Take 1 tablet by mouth daily.    PHQ 2/9 Scores 06/11/2021 12/06/2020 07/11/2020 06/21/2020  PHQ - 2 Score 0 0 0 0  PHQ- 9 Score 0 0 0 0    GAD 7 : Generalized Anxiety Score 06/11/2021 12/06/2020 07/11/2020 01/09/2020  Nervous, Anxious, on Edge 0 0 0 0  Control/stop worrying 0 0 0 0  Worry too much - different things 0 0 0 0  Trouble relaxing 0 0 0 0  Restless 0 0 0 0  Easily annoyed or irritable 0 0 0 0  Afraid - awful might happen 0 0 0 0  Total GAD 7 Score 0 0 0 0  Anxiety Difficulty Not difficult at all - - -    BP Readings from Last 3 Encounters:  06/11/21 120/80  12/06/20 110/64  07/26/20 140/90    Physical Exam Vitals and nursing note reviewed.  Constitutional:      Appearance: She is well-developed.  HENT:     Head: Normocephalic.     Right Ear: External ear normal.     Left Ear: External ear normal.  Eyes:     General: Lids are everted, no foreign bodies appreciated. No scleral icterus.        Left eye: No foreign body or hordeolum.     Conjunctiva/sclera: Conjunctivae normal.     Right eye: Right conjunctiva is not injected.     Left eye: Left conjunctiva is not injected.     Pupils: Pupils are equal, round, and reactive to light.  Neck:     Thyroid: No thyromegaly.     Vascular: No JVD.     Trachea: No tracheal deviation.  Cardiovascular:     Rate and Rhythm: Normal rate and regular rhythm.     Heart sounds: Normal heart sounds. No murmur heard.   No friction rub. No gallop.  Pulmonary:     Effort: Pulmonary effort is normal. No respiratory distress.     Breath sounds: Normal breath sounds. No wheezing, rhonchi or rales.  Abdominal:     General: Bowel sounds are normal.  Palpations: Abdomen is soft. There is no mass.     Tenderness: There is no abdominal tenderness. There is no guarding or rebound.  Musculoskeletal:        General: No tenderness. Normal range of motion.     Cervical back: Normal range of motion and neck supple.  Lymphadenopathy:     Cervical: No cervical adenopathy.  Skin:    General: Skin is warm.     Findings: No rash.  Neurological:     Mental Status: She is alert and oriented to person, place, and time.     Cranial Nerves: No cranial nerve deficit.     Deep Tendon Reflexes: Reflexes normal.  Psychiatric:        Mood and Affect: Mood is not anxious or depressed.    Wt Readings from Last 3 Encounters:  06/11/21 232 lb (105.2 kg)  12/06/20 228 lb (103.4 kg)  07/26/20 233 lb (105.7 kg)    BP 120/80    Pulse 60    Ht '5\' 2"'  (1.575 m)    Wt 232 lb (105.2 kg)    BMI 42.43 kg/m   Assessment and Plan:  1. Essential (primary) hypertension Chronic.  Controlled.  Stable.  Blood pressure today is 120/80.  Continue amlodipine benazepril combination 5-10 mg once a day.  We will check lipid panel and renal function panel for GFR and electrolytes.  We will recheck patient in 6 months. - amLODipine-benazepril (LOTREL) 5-10 MG capsule; TAKE 1  CAPSULE BY MOUTH EVERY DAY  Dispense: 90 capsule; Refill: 1 - Renal Function Panel  2. Familial hypercholesterolemia Chronic.  Controlled.  Stable.  On review of last LDL it was 133 patient is fasting this morning and we will recheck.  We will check lipid panel to see what her current status is and given problem #3 we have reissued cholesterol lowering guidelines. - Lipid Panel With LDL/HDL Ratio  3. BMI 40.0-44.9, adult [facility]) Health risks of being over weight were discussed and patient was counseled on weight loss options and exercise.  Patient given cholesterol lowering diet with hopes that this will enable her to lose weight prior to recheck in 6 months.

## 2021-06-12 LAB — RENAL FUNCTION PANEL
Albumin: 4.5 g/dL (ref 3.8–4.8)
BUN/Creatinine Ratio: 22 (ref 12–28)
BUN: 18 mg/dL (ref 8–27)
CO2: 23 mmol/L (ref 20–29)
Calcium: 9.6 mg/dL (ref 8.7–10.3)
Chloride: 105 mmol/L (ref 96–106)
Creatinine, Ser: 0.82 mg/dL (ref 0.57–1.00)
Glucose: 87 mg/dL (ref 70–99)
Phosphorus: 3.9 mg/dL (ref 3.0–4.3)
Potassium: 4.7 mmol/L (ref 3.5–5.2)
Sodium: 143 mmol/L (ref 134–144)
eGFR: 79 mL/min/{1.73_m2} (ref >59)

## 2021-06-12 LAB — LIPID PANEL WITH LDL/HDL RATIO
Cholesterol, Total: 200 mg/dL — ABNORMAL HIGH (ref 100–199)
HDL: 54 mg/dL (ref >39)
LDL Chol Calc (NIH): 130 mg/dL — ABNORMAL HIGH (ref 0–99)
LDL/HDL Ratio: 2.4 ratio (ref 0.0–3.2)
Triglycerides: 90 mg/dL (ref 0–149)
VLDL Cholesterol Cal: 16 mg/dL (ref 5–40)

## 2021-08-11 ENCOUNTER — Emergency Department
Admission: EM | Admit: 2021-08-11 | Discharge: 2021-08-11 | Disposition: A | Discharge status: Home / Self Care | Payer: Medicare HMO | Attending: Emergency Medicine | Admitting: Emergency Medicine

## 2021-08-11 ENCOUNTER — Other Ambulatory Visit: Payer: Self-pay

## 2021-08-11 DIAGNOSIS — R112 Nausea with vomiting, unspecified: Secondary | ICD-10-CM | POA: Diagnosis not present

## 2021-08-11 DIAGNOSIS — R000 Tachycardia, unspecified: Secondary | ICD-10-CM | POA: Diagnosis not present

## 2021-08-11 DIAGNOSIS — R197 Diarrhea, unspecified: Secondary | ICD-10-CM | POA: Insufficient documentation

## 2021-08-11 DIAGNOSIS — I10 Essential (primary) hypertension: Secondary | ICD-10-CM | POA: Diagnosis not present

## 2021-08-11 DIAGNOSIS — Z20822 Contact with and (suspected) exposure to covid-19: Secondary | ICD-10-CM | POA: Diagnosis not present

## 2021-08-11 DIAGNOSIS — E860 Dehydration: Secondary | ICD-10-CM

## 2021-08-11 LAB — RESP PANEL BY RT-PCR (FLU A&B, COVID) ARPGX2
Influenza A by PCR: NEGATIVE
Influenza B by PCR: NEGATIVE
SARS Coronavirus 2 by RT PCR: NEGATIVE

## 2021-08-11 LAB — COMPREHENSIVE METABOLIC PANEL
ALT: 27 U/L (ref 0–44)
AST: 30 U/L (ref 15–41)
Albumin: 4 g/dL (ref 3.5–5.0)
Alkaline Phosphatase: 67 U/L (ref 38–126)
Anion gap: 14 (ref 5–15)
BUN: 19 mg/dL (ref 8–23)
CO2: 17 mmol/L — ABNORMAL LOW (ref 22–32)
Calcium: 9.4 mg/dL (ref 8.9–10.3)
Chloride: 104 mmol/L (ref 98–111)
Creatinine, Ser: 1.29 mg/dL — ABNORMAL HIGH (ref 0.44–1.00)
GFR, Estimated: 46 mL/min — ABNORMAL LOW (ref >60)
Glucose, Bld: 118 mg/dL — ABNORMAL HIGH (ref 70–99)
Potassium: 3.7 mmol/L (ref 3.5–5.1)
Sodium: 135 mmol/L (ref 135–145)
Total Bilirubin: 0.4 mg/dL (ref 0.3–1.2)
Total Protein: 7.6 g/dL (ref 6.5–8.1)

## 2021-08-11 LAB — CBC
HCT: 44.8 % (ref 36.0–46.0)
Hemoglobin: 14.5 g/dL (ref 12.0–15.0)
MCH: 26.8 pg (ref 26.0–34.0)
MCHC: 32.4 g/dL (ref 30.0–36.0)
MCV: 82.7 fL (ref 80.0–100.0)
Platelets: 403 10*3/uL — ABNORMAL HIGH (ref 150–400)
RBC: 5.42 MIL/uL — ABNORMAL HIGH (ref 3.87–5.11)
RDW: 13 % (ref 11.5–15.5)
WBC: 6.9 10*3/uL (ref 4.0–10.5)
nRBC: 0 % (ref 0.0–0.2)

## 2021-08-11 LAB — LIPASE, BLOOD: Lipase: 32 U/L (ref 11–51)

## 2021-08-11 MED ORDER — LACTATED RINGERS IV BOLUS
1000.0000 mL | Freq: Once | INTRAVENOUS | Status: AC
  Administered 2021-08-11: 1000 mL via INTRAVENOUS

## 2021-08-11 MED ORDER — ONDANSETRON HCL 4 MG/2ML IJ SOLN
4.0000 mg | Freq: Once | INTRAMUSCULAR | Status: AC
  Administered 2021-08-11: 4 mg via INTRAVENOUS
  Filled 2021-08-11: qty 2

## 2021-08-11 MED ORDER — ONDANSETRON HCL 4 MG PO TABS: 4.0000 mg | ORAL_TABLET | Freq: Three times a day (TID) | ORAL | 0 refills | Status: DC | PRN

## 2021-08-11 NOTE — ED Notes (Signed)
Follow up pcp info provided all questions answered, rx [facility] info provided  ?

## 2021-08-11 NOTE — ED Provider Notes (Signed)
? ?[facility] ?Provider Note ? ? ? Event Date/Time  ? First MD Initiated Contact with Patient 08/11/21 1047   ?  (approximate) ? ? ?History  ? ?Emesis and Dizziness ? ? ?HPI ? ?Shannon Blake is a 66 y.o. female with a past medical history of arthritis and hypertension who presents for evaluation of 3 days of persistent nonbloody nonbilious vomiting, nausea, and nonbloody diarrhea.  She states she may have gotten some stomach bug from a coworker.  She denies any recent travel outside [place] or suspicious food intake.  No sick contacts at home.  She denies any fevers, cough, shortness of breath, chest pain, back pain, abdominal pain, blood in her stool, urinary symptoms, rash or extremity pain.  He states she has been feeling a little dizzy and thinks it is because she is getting dehydrated.  Denies any EtOH use or illicit drug use.  No other acute concerns at this time.  ? ?  ?Past Medical History:  ?Diagnosis Date  ? Arthritis   ? Family history of ovarian cancer   ? Pt's affected sister is MyRisk neg  ? Hypertension   ? ? ? ?Physical Exam  ?Triage Vital Signs: ?ED Triage Vitals  ?Enc Vitals Group  ?   BP 08/11/21 1046 107/85  ?   Pulse Rate 08/11/21 1046 (!) 121  ?   Resp 08/11/21 1046 (!) 22  ?   Temp 08/11/21 1046 99.2 ?F (37.3 ?C)  ?   Temp Source 08/11/21 1046 Oral  ?   SpO2 08/11/21 1046 93 %  ?   Weight 08/11/21 1044 233 lb (105.7 kg)  ?   Height 08/11/21 1044 '5\' 2"'$  (1.575 m)  ?   Head Circumference --   ?   Peak Flow --   ?   Pain Score 08/11/21 1044 0  ?   Pain Loc --   ?   Pain Edu? --   ?   Excl. in [place]? --   ? ? ?Most recent vital signs: ?Vitals:  ? 08/11/21 1046  ?BP: 107/85  ?Pulse: (!) 121  ?Resp: (!) 22  ?Temp: 99.2 ?F (37.3 ?C)  ?SpO2: 93%  ? ? ?General: Awake, no distress.  ?CV:  2+ radial pulses.  Prolonged capillary refill.  No murmur. ?Resp:  Normal effort.  Clear bilaterally. ?Abd:  No distention.  Soft throughout. ?Other:  No CVA tenderness. ? ? ?ED Results /  Procedures / Treatments  ?Labs ?(all labs ordered are listed, but only abnormal results are displayed) ?Labs Reviewed  ?COMPREHENSIVE METABOLIC PANEL - Abnormal; Notable for the following components:  ?    Result Value  ? CO2 17 (*)   ? Glucose, Bld 118 (*)   ? Creatinine, Ser 1.29 (*)   ? GFR, Estimated 46 (*)   ? All other components within normal limits  ?CBC - Abnormal; Notable for the following components:  ? RBC 5.42 (*)   ? Platelets 403 (*)   ? All other components within normal limits  ?RESP PANEL BY RT-PCR (FLU A&B, COVID) ARPGX2  ?LIPASE, BLOOD  ?URINALYSIS, ROUTINE W REFLEX MICROSCOPIC  ? ? ? ?EKG ? ?ECG is remarkable sinus tachycardia with a ventricular rate of 127, PVCs, unremarkable intervals with some nonspecific ST changes in lead I and lead II without other clear evidence of acute ischemia or significant arrhythmia. ? ? ?RADIOLOGY ? ? ? ?PROCEDURES: ? ?Critical Care performed: No ? ?.1-3 Lead EKG Interpretation ?Performed by: Tamala Julian,  Ida Rogue, MD ?Authorized by: Lucrezia Starch, MD  ? ?  Interpretation: non-specific   ?  ECG rate assessment: tachycardic   ?  Rhythm: sinus rhythm   ?  Ectopy: none   ?  Conduction: normal   ? ?The patient is on the cardiac monitor to evaluate for evidence of arrhythmia and/or significant heart rate changes. ? ? ?MEDICATIONS ORDERED IN ED: ?Medications  ?lactated ringers bolus 1,000 mL (0 mLs Intravenous Stopped 08/11/21 1249)  ?ondansetron [facility]) injection 4 mg (4 mg Intravenous Given 08/11/21 1132)  ?lactated ringers bolus 1,000 mL (1,000 mLs Intravenous New Bag/Given 08/11/21 1248)  ? ? ? ?IMPRESSION / MDM / ASSESSMENT AND PLAN / ED COURSE  ?I reviewed the triage vital signs and the nursing notes. ?             ?               ? ?Differential diagnosis includes, but is not limited to some dizziness and possible metabolic derangements and kidney injury from GI losses.  This may be secondary to an acute infectious process which I think is most likely.  She is denying  any cough, fever, shortness of breath, chest pain or abdominal pain and has no abdominal tenderness to suggest pneumonia, ACS, PE, diverticulitis, appendicitis or other serious invasive abdominal process.  In addition she has no leukocytosis and CBC otherwise shows no evidence of acute anemia and platelets of 403.  CMP shows bicarb of 17 but normal anion gap and no other significant electrolyte derangements.  Mild AKI with a creatinine of 1.29 compared to 0.8 to 2 months ago without any evidence of hepatitis or cholestatic process.  COVID influenza PCR is negative.  Lipase not suggestive of pancreatitis. ? ?Overall presentation and work-up is most consistent with dehydration secondary to acute infectious gastroenteritis.  Patient was hydrated with IV fluids and given antiemetics.  On reassessment she is feeling much better and able to orally rehydrate.  I have a low suspicion for sepsis or other me life-threatening process.  Her tachycardia resolved with IV fluids.  She was initially charted to have an SPO2 of 93% on several rechecks and is in the mid 90s between 94 and 99%.  In addition she is continuing to deny any shortness of breath chest pain cough or other associated sick symptoms.  At this point I think she is stable for discharge with outpatient follow-up.  Discharged in stable condition.  Strict and precautions advised and discussed. ? ? ? ?  ? ? ?FINAL CLINICAL IMPRESSION(S) / ED DIAGNOSES  ? ?Final diagnoses:  ?Nausea vomiting and diarrhea  ?Dehydration  ? ? ? ?Rx / DC Orders  ? ?ED Discharge Orders   ? ?      Ordered  ?  ondansetron (ZOFRAN) 4 MG tablet  Every 8 hours PRN       ? 08/11/21 1301  ? ?  ?  ? ?  ? ? ? ?Note:  This document was prepared using Dragon voice recognition software and may include unintentional dictation errors. ?  ?Lucrezia Starch, MD ?08/11/21 1305 ? ?

## 2021-08-11 NOTE — ED Triage Notes (Signed)
Pt comes pov with n/v/d since Thursday and dizziness. Thinks she is dehydrated.  ?

## 2021-08-13 ENCOUNTER — Ambulatory Visit (INDEPENDENT_AMBULATORY_CARE_PROVIDER_SITE_OTHER): Payer: Medicare HMO | Admitting: Family Medicine

## 2021-08-13 ENCOUNTER — Ambulatory Visit: Payer: Self-pay | Admitting: *Deleted

## 2021-08-13 ENCOUNTER — Encounter: Payer: Self-pay | Admitting: Family Medicine

## 2021-08-13 ENCOUNTER — Other Ambulatory Visit: Payer: Self-pay

## 2021-08-13 ENCOUNTER — Encounter: Payer: Self-pay | Admitting: Emergency Medicine

## 2021-08-13 ENCOUNTER — Other Ambulatory Visit
Admission: RE | Admit: 2021-08-13 | Discharge: 2021-08-13 | Disposition: A | Discharge status: Home / Self Care | Payer: Medicare HMO | Source: Home / Self Care | Attending: Family Medicine | Admitting: Family Medicine

## 2021-08-13 ENCOUNTER — Emergency Department
Admission: EM | Admit: 2021-08-13 | Discharge: 2021-08-13 | Disposition: A | Discharge status: Home / Self Care | Payer: Medicare HMO | Attending: Student in an Organized Health Care Education/Training Program | Admitting: Student in an Organized Health Care Education/Training Program

## 2021-08-13 VITALS — BP 130/80 | HR 68 | Ht 62.0 in | Wt 226.0 lb

## 2021-08-13 DIAGNOSIS — N179 Acute kidney failure, unspecified: Secondary | ICD-10-CM | POA: Diagnosis not present

## 2021-08-13 DIAGNOSIS — R112 Nausea with vomiting, unspecified: Secondary | ICD-10-CM | POA: Insufficient documentation

## 2021-08-13 DIAGNOSIS — R34 Anuria and oliguria: Secondary | ICD-10-CM | POA: Diagnosis not present

## 2021-08-13 DIAGNOSIS — E119 Type 2 diabetes mellitus without complications: Secondary | ICD-10-CM | POA: Insufficient documentation

## 2021-08-13 DIAGNOSIS — E860 Dehydration: Secondary | ICD-10-CM | POA: Insufficient documentation

## 2021-08-13 DIAGNOSIS — A084 Viral intestinal infection, unspecified: Secondary | ICD-10-CM | POA: Diagnosis not present

## 2021-08-13 DIAGNOSIS — R197 Diarrhea, unspecified: Secondary | ICD-10-CM | POA: Diagnosis not present

## 2021-08-13 LAB — POCT URINALYSIS DIPSTICK
Bilirubin, UA: POSITIVE
Blood, UA: NEGATIVE
Glucose, UA: NEGATIVE
Ketones, UA: POSITIVE
Nitrite, UA: NEGATIVE
Protein, UA: POSITIVE — AB
Spec Grav, UA: 1.02 (ref 1.010–1.025)
Urobilinogen, UA: 0.2 E.U./dL
pH, UA: 5 (ref 5.0–8.0)

## 2021-08-13 LAB — CBC WITH DIFFERENTIAL/PLATELET
Abs Immature Granulocytes: 0.02 10*3/uL (ref 0.00–0.07)
Basophils Absolute: 0 10*3/uL (ref 0.0–0.1)
Basophils Relative: 0 %
Eosinophils Absolute: 0.1 10*3/uL (ref 0.0–0.5)
Eosinophils Relative: 2 %
HCT: 38.5 % (ref 36.0–46.0)
Hemoglobin: 12.9 g/dL (ref 12.0–15.0)
Immature Granulocytes: 0 %
Lymphocytes Relative: 41 %
Lymphs Abs: 3 10*3/uL (ref 0.7–4.0)
MCH: 27.5 pg (ref 26.0–34.0)
MCHC: 33.5 g/dL (ref 30.0–36.0)
MCV: 82.1 fL (ref 80.0–100.0)
Monocytes Absolute: 0.8 10*3/uL (ref 0.1–1.0)
Monocytes Relative: 11 %
Neutro Abs: 3.4 10*3/uL (ref 1.7–7.7)
Neutrophils Relative %: 46 %
Platelets: 417 10*3/uL — ABNORMAL HIGH (ref 150–400)
RBC: 4.69 MIL/uL (ref 3.87–5.11)
RDW: 13.2 % (ref 11.5–15.5)
WBC: 7.4 10*3/uL (ref 4.0–10.5)
nRBC: 0 % (ref 0.0–0.2)

## 2021-08-13 LAB — URINALYSIS, COMPLETE (UACMP) WITH MICROSCOPIC
Bilirubin Urine: NEGATIVE
Glucose, UA: NEGATIVE mg/dL
Hgb urine dipstick: NEGATIVE
Ketones, ur: NEGATIVE mg/dL
Nitrite: NEGATIVE
Protein, ur: NEGATIVE mg/dL
Specific Gravity, Urine: 1.013 (ref 1.005–1.030)
pH: 5 (ref 5.0–8.0)

## 2021-08-13 LAB — CBC
HCT: 37.6 % (ref 36.0–46.0)
Hemoglobin: 12.2 g/dL (ref 12.0–15.0)
MCH: 26.9 pg (ref 26.0–34.0)
MCHC: 32.4 g/dL (ref 30.0–36.0)
MCV: 83 fL (ref 80.0–100.0)
Platelets: 374 10*3/uL (ref 150–400)
RBC: 4.53 MIL/uL (ref 3.87–5.11)
RDW: 13.1 % (ref 11.5–15.5)
WBC: 5.4 10*3/uL (ref 4.0–10.5)
nRBC: 0 % (ref 0.0–0.2)

## 2021-08-13 LAB — COMPREHENSIVE METABOLIC PANEL
ALT: 24 U/L (ref 0–44)
AST: 22 U/L (ref 15–41)
Albumin: 3.8 g/dL (ref 3.5–5.0)
Alkaline Phosphatase: 60 U/L (ref 38–126)
Anion gap: 10 (ref 5–15)
BUN: 21 mg/dL (ref 8–23)
CO2: 21 mmol/L — ABNORMAL LOW (ref 22–32)
Calcium: 9.3 mg/dL (ref 8.9–10.3)
Chloride: 106 mmol/L (ref 98–111)
Creatinine, Ser: 1.43 mg/dL — ABNORMAL HIGH (ref 0.44–1.00)
GFR, Estimated: 40 mL/min — ABNORMAL LOW (ref >60)
Glucose, Bld: 102 mg/dL — ABNORMAL HIGH (ref 70–99)
Potassium: 4 mmol/L (ref 3.5–5.1)
Sodium: 137 mmol/L (ref 135–145)
Total Bilirubin: 0.2 mg/dL — ABNORMAL LOW (ref 0.3–1.2)
Total Protein: 6.9 g/dL (ref 6.5–8.1)

## 2021-08-13 LAB — RENAL FUNCTION PANEL
Albumin: 4.1 g/dL (ref 3.5–5.0)
Anion gap: 10 (ref 5–15)
BUN: 21 mg/dL (ref 8–23)
CO2: 21 mmol/L — ABNORMAL LOW (ref 22–32)
Calcium: 9.2 mg/dL (ref 8.9–10.3)
Chloride: 104 mmol/L (ref 98–111)
Creatinine, Ser: 1.5 mg/dL — ABNORMAL HIGH (ref 0.44–1.00)
GFR, Estimated: 38 mL/min — ABNORMAL LOW (ref >60)
Glucose, Bld: 113 mg/dL — ABNORMAL HIGH (ref 70–99)
Phosphorus: 3.7 mg/dL (ref 2.5–4.6)
Potassium: 3.8 mmol/L (ref 3.5–5.1)
Sodium: 135 mmol/L (ref 135–145)

## 2021-08-13 LAB — LACTIC ACID, PLASMA: Lactic Acid, Venous: 1.3 mmol/L (ref 0.5–1.9)

## 2021-08-13 MED ORDER — LACTATED RINGERS IV BOLUS
1000.0000 mL | Freq: Once | INTRAVENOUS | Status: AC
  Administered 2021-08-13: 1000 mL via INTRAVENOUS

## 2021-08-13 MED ORDER — SODIUM CHLORIDE 0.9 % IV BOLUS
1000.0000 mL | Freq: Once | INTRAVENOUS | Status: AC
  Administered 2021-08-13: 1000 mL via INTRAVENOUS

## 2021-08-13 MED ORDER — LACTATED RINGERS IV BOLUS: 1000.0000 mL | Freq: Once | INTRAVENOUS | Status: DC

## 2021-08-13 NOTE — Telephone Encounter (Signed)
?Chief Complaint: diarrhea ,wants appt. ?Symptoms: diarrhea, watery, drinking fluids and gatorade. Able to eat egg sandwich today and keep down. C/o weakness, able to stand , dry mouth, unsure of last time to urinate due to watery diarrhea.  ?Frequency: last Thursday  ?Pertinent Negatives: Patient denies chest pain , difficulty breathing, no fever, no dizziness, no lightheadedness no headache ?Disposition: '[x]'$ ED /'[]'$ Urgent Care (no appt availability in office) / '[x]'$ Appointment(In office/virtual)/ '[]'$  [place] [facility]/ '[]'$ Home Care/ '[x]'$ Refused Recommended Disposition /'[]'$ [place] [facility]/ '[]'$  Follow-up with PCP ?Additional Notes:  ? ?Patient would like to check with PCP prior to going back to ED if possible . Appt not until this afternoon at 3:20. Instructed patient to increase water/ fluid intake. If no urination by 11:30 go to ED. Please advise if patient can keep appt this afternoon. Patient was last seen in ED Sunday for IV fluids d/t dehydration.  ? ? Reason for Disposition ? [1] Drinking very little AND [2] dehydration suspected (e.g., no urine > 12 hours, very dry mouth, very lightheaded) ? ?Answer Assessment - Initial Assessment Questions ?1. VOMITING SEVERITY: "How many times have you vomited in the past 24 hours?"  ?   - MILD:  1 - 2 times/day ?   - MODERATE: 3 - 5 times/day, decreased oral intake without significant weight loss or symptoms of dehydration ?   - SEVERE: 6 or more times/day, vomits everything or nearly everything, with significant weight loss, symptoms of dehydration  ?    None  ?2. ONSET: "When did the vomiting begin?"  ?    Last Thursday  ?3. FLUIDS: "What fluids or food have you vomited up today?" "Have you been able to keep any fluids down?" ?    None able to eat eggs this am  ?4. ABDOMINAL PAIN: "Are your having any abdominal pain?" If yes : "How bad is it and what does it feel like?" (e.g., crampy, dull, intermittent, constant)  ?    no ?5. DIARRHEA: "Is there any diarrhea?"  If Yes, ask: "How many times today?"  ?    Yes , x 2 watery  ?6. CONTACTS: "Is there anyone else in the family with the same symptoms?"  ?    No  ?7. CAUSE: "What do you think is causing your vomiting?" ?    Not sure  ?8. HYDRATION STATUS: "Any signs of dehydration?" (e.g., dry mouth [not only dry lips], too weak to stand) "When did you last urinate?" ?    Last time to urinate not sure  ?9. OTHER SYMPTOMS: "Do you have any other symptoms?" (e.g., fever, headache, vertigo, vomiting blood or coffee grounds, recent head injury) ?    Low grade fever Sunday none today , diarrhea x 2 today  ?10. PREGNANCY: "Is there any chance you are pregnant?" "When was your last menstrual period?" ?      na ? ?Answer Assessment - Initial Assessment Questions ?1. DIARRHEA SEVERITY: "How bad is the diarrhea?" "How many more stools have you had in the past 24 hours than normal?"  ?  - NO DIARRHEA (SCALE 0) ?  - MILD (SCALE 1-3): Few loose or mushy BMs; increase of 1-3 stools over normal daily number of stools; mild increase in ostomy output. ?  -  MODERATE (SCALE 4-7): Increase of 4-6 stools daily over normal; moderate increase in ostomy output. ?* SEVERE (SCALE 8-10; OR 'WORST POSSIBLE'): Increase of 7 or more stools daily over normal; moderate increase in ostomy output; incontinence. ?  X 2 watery  ?2. ONSET: "When did the diarrhea begin?"  ?    Last Thursday  ?3. BM CONSISTENCY: "How loose or watery is the diarrhea?"  ?    Watery  ?4. VOMITING: "Are you also vomiting?" If Yes, ask: "How many times in the past 24 hours?"  ?   Not in the last 24 hours  ?5. ABDOMINAL PAIN: "Are you having any abdominal pain?" If Yes, ask: "What does it feel like?" (e.g., crampy, dull, intermittent, constant)  ?    denies ?6. ABDOMINAL PAIN SEVERITY: If present, ask: "How bad is the pain?"  (e.g., Scale 1-10; mild, moderate, or severe) ?  - MILD (1-3): doesn't interfere with normal activities, abdomen soft and not tender to touch  ?  - MODERATE (4-7):  interferes with normal activities or awakens from sleep, abdomen tender to touch  ?  - SEVERE (8-10): excruciating pain, doubled over, unable to do any normal activities   ?    na ?7. ORAL INTAKE: If vomiting, "Have you been able to drink liquids?" "How much liquids have you had in the past 24 hours?" ?    Yes , small amounts  ?8. HYDRATION: "Any signs of dehydration?" (e.g., dry mouth [not just dry lips], too weak to stand, dizziness, new weight loss) "When did you last urinate?" ?    Dry mouth, feels weak can stand , unsure of last time to urinate ?9. EXPOSURE: "Have you traveled to a foreign country recently?" "Have you been exposed to anyone with diarrhea?" "Could you have eaten any food that was spoiled?" ?    na ?10. ANTIBIOTIC USE: "Are you taking antibiotics now or have you taken antibiotics in the past 2 months?" ?      na ?11. OTHER SYMPTOMS: "Do you have any other symptoms?" (e.g., fever, blood in stool) ?      na ?12. PREGNANCY: "Is there any chance you are pregnant?" "When was your last menstrual period?" ?      na ? ?Protocols used: Vomiting-A-AH, Diarrhea-A-AH ? ?

## 2021-08-13 NOTE — ED Triage Notes (Signed)
Seen through ED on 3/5 for N/D/V  followed up with PCP and labs redrawn, CR elevated.  SEnt to ED. ?

## 2021-08-13 NOTE — ED Provider Notes (Signed)
? ?[facility] ?Provider Note ? ? ? Event Date/Time  ? First MD Initiated Contact with Patient 08/13/21 1759   ?  (approximate) ? ? ?History  ? ?Abnormal Lab ? ? ?HPI ? ?Shannon Blake is a 66 y.o. female   no significant past medical history presents to the ER for evaluation of dehydration and concern for AKI in the setting of recent GI illness.  Was having nausea vomiting watery diarrhea several days ago was actually seen in the ER given IV fluids.  Her symptoms have significantly improved but still feeling somewhat dehydrated and has had decreased urine output over the past 24 hours.  She denies any abdominal pain no flank pain.  Has not had any diarrhea since early this morning.  No recent antibiotics.  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 ? ?  ? ? ?Physical Exam  ? ?Triage Vital Signs: ?ED Triage Vitals  ?Enc Vitals Group  ?   BP 08/13/21 1801 114/67  ?   Pulse Rate 08/13/21 1801 71  ?   Resp 08/13/21 1801 17  ?   Temp 08/13/21 1801 97.8 ?F (36.6 ?C)  ?   Temp Source 08/13/21 1801 Oral  ?   SpO2 08/13/21 1801 99 %  ?   Weight 08/13/21 1755 225 lb 15.5 oz (102.5 kg)  ?   Height 08/13/21 1755 '5\' 2"'$  (1.575 m)  ?   Head Circumference --   ?   Peak Flow --   ?   Pain Score --   ?   Pain Loc --   ?   Pain Edu? --   ?   Excl. in [place]? --   ? ? ?Most recent vital signs: ?Vitals:  ? 08/13/21 2019 08/13/21 2100  ?BP: 120/64 133/67  ?Pulse: 86 80  ?Resp: 18 18  ?Temp: 98.5 ?F (36.9 ?C)   ?SpO2: 99% 100%  ? ? ? ?Constitutional: Alert  ?Eyes: Conjunctivae are normal.  ?Head: Atraumatic. ?Nose: No congestion/rhinnorhea. ?Mouth/Throat: Mucous membranes are moist.   ?Neck: Painless ROM.  ?Cardiovascular:   Good peripheral circulation. ?Respiratory: Normal respiratory effort.  No retractions.  ?Gastrointestinal: Soft and nontender.  ?Musculoskeletal:  no deformity ?Neurologic:  MAE spontaneously. No gross focal neurologic deficits are appreciated.  ?Skin:  Skin  is warm, dry and intact. No rash noted. ?Psychiatric: Mood and affect are normal. Speech and behavior are normal. ? ? ? ?ED Results / Procedures / Treatments  ? ?Labs ?(all labs ordered are listed, but only abnormal results are displayed) ?Labs Reviewed  ?COMPREHENSIVE METABOLIC PANEL - Abnormal; Notable for the following components:  ?    Result Value  ? CO2 21 (*)   ? Glucose, Bld 102 (*)   ? Creatinine, Ser 1.43 (*)   ? Total Bilirubin 0.2 (*)   ? GFR, Estimated 40 (*)   ? All other components within normal limits  ?URINALYSIS, COMPLETE (UACMP) WITH MICROSCOPIC - Abnormal; Notable for the following components:  ? Color, Urine YELLOW (*)   ? APPearance HAZY (*)   ? Leukocytes,Ua LARGE (*)   ? Bacteria, UA RARE (*)   ? All other components within normal limits  ?CBC  ?LACTIC ACID, PLASMA  ? ? ? ?EKG ? ? ? ? ?RADIOLOGY ? ? ? ?PROCEDURES: ? ?Critical Care performed:  ? ?Procedures ? ? ?MEDICATIONS ORDERED IN ED: ?Medications  ?lactated ringers bolus 1,000 mL (has no administration in time range)  ?  sodium chloride 0.9 % bolus 1,000 mL (0 mLs Intravenous Stopped 08/13/21 1935)  ?lactated ringers bolus 1,000 mL (0 mLs Intravenous Stopped 08/13/21 2132)  ? ? ? ?IMPRESSION / MDM / ASSESSMENT AND PLAN / ED COURSE  ?I reviewed the triage vital signs and the nursing notes. ?             ?               ? ?Differential diagnosis includes, but is not limited to, aki, dehydration, colitis, electrolyte abn, sepsis, cdif ? ?Patient clinically very well-appearing afebrile hemodynamically stable no white count no anemia.  Blood work from outpatient clinic does show that she has AKI will give IV fluids.  She is denying any nausea at this time.  Will give IV fluid resuscitation until patient able to provide urine sample. ? ? ?Clinical Course as of 08/13/21 2230  ?Tue Aug 13, 2021  ?1912 Renal function is already improving as compared to earlier today.  No lactic acidemia. [PR]  ?2229 Patient reassessed.  Feels significantly improved.   She is not having any nausea.  Repeat exam is reassuring.  Discussed option for additional observation the hospital for IV fluids patient tolerating p.o. and feels well as she is making urine with not specifically concentrated spec graph I do believe she stable and appropriate for outpatient follow-up. [PR]  ?  ?Clinical Course User Index ?[PR] Merlyn Lot, MD  ? ? ? ?FINAL CLINICAL IMPRESSION(S) / ED DIAGNOSES  ? ?Final diagnoses:  ?Dehydration  ?AKI (acute kidney injury) ([employer])  ? ? ? ?Rx / DC Orders  ? ?ED Discharge Orders   ? ? None  ? ?  ? ? ? ?Note:  This document was prepared using Dragon voice recognition software and may include unintentional dictation errors. ? ?  ?Merlyn Lot, MD ?08/13/21 2230 ? ?

## 2021-08-13 NOTE — Progress Notes (Signed)
? ? ?Date:  08/13/2021  ? ?Name:  Shannon Blake   DOB:  08-Jan-1956   MRN:  [id number] ? ? ?Chief Complaint: Diarrhea (Having nausea, diarrhea and vomiting x 3 days, cannot urinate but a little at a time. ) ? ?Diarrhea  ?This is a new problem. The current episode started in the past 7 days. The problem occurs 2 to 4 times per day. The problem has been gradually improving. The stool consistency is described as Watery. Associated symptoms include vomiting. Pertinent negatives include no abdominal pain, arthralgias, chills, coughing, fever, headaches, increased  flatus, myalgias, sweats, URI or weight loss. She has tried anti-motility drug for the symptoms. The treatment provided no relief.  ?Emesis  ?This is a new problem. The current episode started in the past 7 days. The problem occurs intermittently. The problem has been waxing and waning. There has been no fever. Associated symptoms include diarrhea. Pertinent negatives include no abdominal pain, arthralgias, chest pain, chills, coughing, dizziness, fever, headaches, myalgias, sweats, URI or weight loss.  ? ?Lab Results  ?Component Value Date  ? NA 135 08/11/2021  ? K 3.7 08/11/2021  ? CO2 17 (L) 08/11/2021  ? GLUCOSE 118 (H) 08/11/2021  ? BUN 19 08/11/2021  ? CREATININE 1.29 (H) 08/11/2021  ? CALCIUM 9.4 08/11/2021  ? EGFR 79 06/11/2021  ? GFRNONAA 46 (L) 08/11/2021  ? ?Lab Results  ?Component Value Date  ? CHOL 200 (H) 06/11/2021  ? HDL 54 06/11/2021  ? LDLCALC 130 (H) 06/11/2021  ? TRIG 90 06/11/2021  ? CHOLHDL 3.7 01/03/2019  ? ?Lab Results  ?Component Value Date  ? TSH 2.30 12/28/2013  ? ?Lab Results  ?Component Value Date  ? HGBA1C 5.8 12/28/2013  ? ?Lab Results  ?Component Value Date  ? WBC 6.9 08/11/2021  ? HGB 14.5 08/11/2021  ? HCT 44.8 08/11/2021  ? MCV 82.7 08/11/2021  ? PLT 403 (H) 08/11/2021  ? ?Lab Results  ?Component Value Date  ? ALT 27 08/11/2021  ? AST 30 08/11/2021  ? ALKPHOS 67 08/11/2021  ? BILITOT 0.4 08/11/2021  ? ?No results found for:  25OHVITD2, [place], VD25OH  ? ?Review of Systems  ?Constitutional:  Negative for chills, fever and weight loss.  ?HENT:  Negative for drooling, ear discharge, ear pain and sore throat.   ?Respiratory:  Negative for cough, shortness of breath and wheezing.   ?Cardiovascular:  Negative for chest pain, palpitations and leg swelling.  ?Gastrointestinal:  Positive for diarrhea and vomiting. Negative for abdominal pain, blood in stool, constipation, flatus and nausea.  ?Endocrine: Negative for polydipsia.  ?Genitourinary:  Negative for dysuria, frequency, hematuria and urgency.  ?Musculoskeletal:  Negative for arthralgias, back pain, myalgias and neck pain.  ?Skin:  Negative for rash.  ?Allergic/Immunologic: Negative for environmental allergies.  ?Neurological:  Negative for dizziness and headaches.  ?Hematological:  Does not bruise/bleed easily.  ?Psychiatric/Behavioral:  Negative for suicidal ideas. The patient is not nervous/anxious.   ? ?Patient Active Problem List  ? Diagnosis Date Noted  ? BMI 40.0-44.9, adult ([place]) 06/27/2019  ? Familial hypercholesterolemia 06/27/2019  ? Special screening for malignant neoplasms, colon   ? Polyp of sigmoid colon   ? Family history of peritoneal cancer 07/20/2017  ? S/P total knee arthroplasty 08/27/2015  ? Calculus of kidney 10/17/2014  ? Alimentary obesity 10/17/2014  ? Cough 10/17/2014  ? Essential (primary) hypertension 10/17/2014  ? Combined fat and carbohydrate induced hyperlipemia 10/17/2014  ? Derangement of knee 12/01/2013  ? Arthritis of knee,  degenerative 12/01/2013  ? ? ?No Known Allergies ? ?Past Surgical History:  ?Procedure Laterality Date  ? CHOLECYSTECTOMY    ? COLONOSCOPY  2007  ? Dr Candace Cruise  ? COLONOSCOPY WITH PROPOFOL N/A 08/11/2017  ? Procedure: COLONOSCOPY WITH PROPOFOL;  Surgeon: Lucilla Lame, MD;  [facility] END[facility]COPY;  Service: Endoscopy;  Laterality: N/A;  ? GALLBLADDER SURGERY    ? KNEE ARTHROPLASTY Left 08/27/2015  ? Procedure: COMPUTER ASSISTED TOTAL KNEE  ARTHROPLASTY;  Surgeon: Dereck Leep, MD;  Location: [facility] ORS;  Service: Orthopedics;  Laterality: Left;  ? meniscal surgery Left   ? ? ?Social History  ? ?Tobacco Use  ? Smoking status: Never  ? Smokeless tobacco: Never  ?Vaping Use  ? Vaping Use: Never used  ?Substance Use Topics  ? Alcohol use: No  ?  Alcohol/week: 0.0 standard drinks  ? Drug use: No  ? ? ? ?Medication list has been reviewed and updated. ? ?Current Meds  ?Medication Sig  ? amLODipine-benazepril (LOTREL) 5-10 MG capsule TAKE 1 CAPSULE BY MOUTH EVERY DAY  ? aspirin 81 MG chewable tablet Chew 81 mg by mouth daily.   ? Calcium Carb-Cholecalciferol (CALCIUM 600 + D PO) Take 1 tablet by mouth daily.  ? Multiple Vitamins-Minerals (CENTRUM SILVER ULTRA WOMENS) TABS Take 1 tablet by mouth daily.  ? ondansetron (ZOFRAN) 4 MG tablet Take 1 tablet (4 mg total) by mouth every 8 (eight) hours as needed for up to 5 doses for nausea or vomiting.  ? ? ?PHQ 2/9 Scores 06/11/2021 12/06/2020 07/11/2020 06/21/2020  ?PHQ - 2 Score 0 0 0 0  ?PHQ- 9 Score 0 0 0 0  ? ? ?GAD 7 : Generalized Anxiety Score 06/11/2021 12/06/2020 07/11/2020 01/09/2020  ?Nervous, Anxious, on Edge 0 0 0 0  ?Control/stop worrying 0 0 0 0  ?Worry too much - different things 0 0 0 0  ?Trouble relaxing 0 0 0 0  ?Restless 0 0 0 0  ?Easily annoyed or irritable 0 0 0 0  ?Afraid - awful might happen 0 0 0 0  ?Total GAD 7 Score 0 0 0 0  ?Anxiety Difficulty Not difficult at all - - -  ? ? ?BP Readings from Last 3 Encounters:  ?08/13/21 130/80  ?08/11/21 110/87  ?06/11/21 120/80  ? ? ?Physical Exam ?Vitals and nursing note reviewed. Exam conducted with a chaperone present.  ?Constitutional:   ?   General: She is not in acute distress. ?   Appearance: She is not diaphoretic.  ?HENT:  ?   Head: Normocephalic and atraumatic.  ?   Right Ear: Tympanic membrane, ear canal and external ear normal.  ?   Left Ear: Tympanic membrane, ear canal and external ear normal.  ?   Nose: Nose normal. No congestion or rhinorrhea.  ?    Mouth/Throat:  ?   Mouth: Mucous membranes are moist.  ?Eyes:  ?   General:     ?   Right eye: No discharge.     ?   Left eye: No discharge.  ?   Conjunctiva/sclera: Conjunctivae normal.  ?   Pupils: Pupils are equal, round, and reactive to light.  ?Neck:  ?   Thyroid: No thyromegaly.  ?   Vascular: No JVD.  ?Cardiovascular:  ?   Rate and Rhythm: Normal rate and regular rhythm.  ?   Heart sounds: Normal heart sounds. No murmur heard. ?  No friction rub. No gallop.  ?Pulmonary:  ?   Effort: Pulmonary effort  is normal.  ?   Breath sounds: Normal breath sounds. No wheezing, rhonchi or rales.  ?Abdominal:  ?   General: Bowel sounds are normal.  ?   Palpations: Abdomen is soft. There is no hepatomegaly, splenomegaly or mass.  ?   Tenderness: There is no abdominal tenderness. There is no guarding.  ?Musculoskeletal:     ?   General: Normal range of motion.  ?   Cervical back: Normal range of motion and neck supple.  ?Lymphadenopathy:  ?   Cervical: No cervical adenopathy.  ?Skin: ?   General: Skin is warm and dry.  ?Neurological:  ?   Mental Status: She is alert.  ?   Deep Tendon Reflexes: Reflexes are normal and symmetric.  ? ? ?Wt Readings from Last 3 Encounters:  ?08/13/21 226 lb (102.5 kg)  ?08/11/21 233 lb (105.7 kg)  ?06/11/21 232 lb (105.2 kg)  ? ? ?BP 130/80   Pulse 68   Ht '5\' 2"'  (1.575 m)   Wt 226 lb (102.5 kg)   BMI 41.34 kg/m?  ? ?Assessment and Plan: ? ?1. Oliguria ?New onset.  Patient is only able to urinate a minimal amount throughout the day which is suggesting that she is under perfusing her kidneys.  Repeating of the creatinine and GFR suggest a worsening of the renal function.  Patient likely needs more IV hydration for a longer period of time before oral intake is likely to be effective ? ?2. AKI (acute kidney injury) ([place]) ?AKI secondary to decreased perfusion secondary to dehydration. ? ?3. Dehydration ?Dehydration secondary to the nausea and vomiting likely due to viral gastroenteritis. ?- POCT  urinalysis dipstick ? ?4. Viral gastroenteritis ?New onset.  Day 3 of emesis and diarrhea.  Patient has Zofran but is unable to keep up with oral intake to can tell further loss ?Fluids by diarrhea/emesis. ? ?

## 2021-09-02 ENCOUNTER — Ambulatory Visit (INDEPENDENT_AMBULATORY_CARE_PROVIDER_SITE_OTHER): Payer: Medicare HMO

## 2021-09-02 DIAGNOSIS — Z0000 Encounter for general adult medical examination without abnormal findings: Secondary | ICD-10-CM | POA: Diagnosis not present

## 2021-09-02 NOTE — Progress Notes (Signed)
? ?Subjective:  ? Shannon Blake is a 66 y.o. female who presents for an Initial Medicare Annual Wellness Visit. ? ?Virtual Visit via Telephone Note ? ?I connected with  Shannon Blake on 09/02/21 at  8:30 AM EDT by telephone and verified that I am speaking with the correct person using two identifiers. ? ?Location: ?Patient: home ?Provider: [facility] ?Persons participating in the virtual visit: patient/Nurse Health Advisor ?  ?I discussed the limitations, risks, security and privacy concerns of performing an evaluation and management service by telephone and the availability of in person appointments. The patient expressed understanding and agreed to proceed. ? ?Interactive audio and video telecommunications were attempted between this nurse and patient, however failed, due to patient having technical difficulties OR patient did not have access to video capability.  We continued and completed visit with audio only. ? ?Some vital signs may be absent or patient reported.  ? ?Shannon Marker, LPN ? ? ?Review of Systems    ? ?Cardiac Risk Factors include: advanced age (>56mn, >>35women);obesity (BMI >30kg/m2);hypertension;dyslipidemia ? ?   ?Objective:  ?  ?There were no vitals filed for this visit. ?There is no height or weight on file to calculate BMI. ? ? ?  09/02/2021  ?  8:35 AM 08/13/2021  ?  5:56 PM 08/11/2021  ? 10:45 AM 08/11/2017  ?  7:39 AM 01/05/2017  ?  2:48 PM 08/27/2015  ? 12:36 PM 08/27/2015  ?  6:05 AM  ?Advanced Directives  ?Does Patient Have a Medical Advance Directive? No No No No No No No  ?Would patient like information on creating a medical advance directive? Yes (MAU/Ambulatory/Procedural Areas - Information given) No - Patient declined  No - Patient declined No - Patient declined  No - patient declined information  ? ? ?Current Medications (verified) ?Outpatient Encounter Medications as of 09/02/2021  ?Medication Sig  ? amLODipine-benazepril (LOTREL) 5-10 MG capsule TAKE 1 CAPSULE BY MOUTH EVERY DAY  ? aspirin 81 MG  chewable tablet Chew 81 mg by mouth daily.   ? Calcium Carb-Cholecalciferol (CALCIUM 600 + D PO) Take 1 tablet by mouth daily.  ? Multiple Vitamins-Minerals (CENTRUM SILVER ULTRA WOMENS) TABS Take 1 tablet by mouth daily.  ? [DISCONTINUED] ondansetron (ZOFRAN) 4 MG tablet Take 1 tablet (4 mg total) by mouth every 8 (eight) hours as needed for up to 5 doses for nausea or vomiting.  ? ?No facility-administered encounter medications on file as of 09/02/2021.  ? ? ?Allergies (verified) ?Patient has no known allergies.  ? ?History: ?Past Medical History:  ?Diagnosis Date  ? Arthritis   ? Family history of ovarian cancer   ? Pt's affected sister is MyRisk neg  ? Hypertension   ? ?Past Surgical History:  ?Procedure Laterality Date  ? CHOLECYSTECTOMY    ? COLONOSCOPY  2007  ? Dr OCandace Cruise ? COLONOSCOPY WITH PROPOFOL N/A 08/11/2017  ? Procedure: COLONOSCOPY WITH PROPOFOL;  Surgeon: WLucilla Lame MD;  [facility];  Service: Endoscopy;  Laterality: N/A;  ? GALLBLADDER SURGERY    ? KNEE ARTHROPLASTY Left 08/27/2015  ? Procedure: COMPUTER ASSISTED TOTAL KNEE ARTHROPLASTY;  Surgeon: JDereck Leep MD;  Location: [facility] ORS;  Service: Orthopedics;  Laterality: Left;  ? meniscal surgery Left   ? ?Family History  ?Problem Relation Age of Onset  ? Hypertension Mother   ? Hypertension Father   ? Cancer Sister [phone] ?     [facility]  ? Breast  cancer Maternal Aunt 88  ? Thyroid cancer Maternal Aunt   ?     gland  ? ?Social History  ? ?Socioeconomic History  ? Marital status: Single  ?  Spouse name: Not on file  ? Number of children: 1  ? Years of education: Not on file  ? Highest education level: Not on file  ?Occupational History  ? Occupation: Optician, dispensing  ?Tobacco Use  ? Smoking status: Never  ? Smokeless tobacco: Never  ?Vaping Use  ? Vaping Use: Never used  ?Substance and Sexual Activity  ? Alcohol use: No  ?  Alcohol/week: 0.0 standard drinks  ? Drug use: No  ? Sexual activity: Yes   ?  Birth control/protection: Post-menopausal  ?Other Topics Concern  ? Not on file  ?Social History Narrative  ? Pt lives with Raynelle Dick - significant other  ? ?Social Determinants of Health  ? ?Financial Resource Strain: Low Risk   ? Difficulty of Paying Living Expenses: Not hard at all  ?Food Insecurity: No Food Insecurity  ? Worried About Charity fundraiser in the Last Year: Never true  ? Ran Out of Food in the Last Year: Never true  ?Transportation Needs: No Transportation Needs  ? Lack of Transportation (Medical): No  ? Lack of Transportation (Non-Medical): No  ?Physical Activity: Inactive  ? Days of Exercise per Week: 0 days  ? Minutes of Exercise per Session: 0 min  ?Stress: No Stress Concern Present  ? Feeling of Stress : Not at all  ?Social Connections: Moderately Isolated  ? Frequency of Communication with Friends and Family: More than three times a week  ? Frequency of Social Gatherings with Friends and Family: Twice a week  ? Attends Religious Services: Never  ? Active Member of Clubs or Organizations: No  ? Attends Archivist Meetings: Never  ? Marital Status: Living with partner  ? ? ?Tobacco Counseling ?Counseling given: Not Answered ? ? ?Clinical Intake: ? ?Pre-visit preparation completed: Yes ? ?Pain : No/denies pain ? ?  ? ?Nutritional Risks: None ?Diabetes: No ? ?How often do you need to have someone help you when you read instructions, pamphlets, or other written materials from your doctor or pharmacy?: 1 - Never ? ? ? ?Interpreter Needed?: No ? ?Information entered by :: Shannon Marker LPN ? ? ?Activities of Daily Living ? ?  09/02/2021  ?  8:36 AM 06/11/2021  ?  7:59 AM  ?In your present state of health, do you have any difficulty performing the following activities:  ?Hearing? 0 0  ?Vision? 0 0  ?Difficulty concentrating or making decisions? 0 0  ?Walking or climbing stairs? 0 0  ?Dressing or bathing? 0 0  ?Doing errands, shopping? 0 0  ?Preparing Food and eating ? N   ?Using the Toilet?  N   ?In the past six months, have you accidently leaked urine? N   ?Do you have problems with loss of bowel control? N   ?Managing your Medications? N   ?Managing your Finances? N   ?Housekeeping or managing your Housekeeping? N   ? ? ?Patient Care Team: ?Juline Patch, MD as PCP - General (Family Medicine) ?Copland, Ginette Otto as Referring Physician (Obstetrics and Gynecology) ?Hooten, Laurice Record, MD (Orthopedic Surgery) ? ?Indicate any recent Medical Services you may have received from other than [facility] providers in the past year (date may be approximate). ? ?   ?Assessment:  ? This is a routine wellness examination for  Shannon Blake. ? ?Hearing/Vision screen ?Hearing Screening - Comments:: Pt denies hearing difficulty ?Vision Screening - Comments:: Annual vision screenings done at [facility] Dr. Sandra Cockayne ? ?Dietary issues and exercise activities discussed: ?Current Exercise Habits: The patient does not participate in regular exercise at present, Exercise limited by: None identified ? ? Goals Addressed   ? ?  ?  ?  ?  ? This Visit's Progress  ?  Increase physical activity     ?  Recommend increasing physical activity to at least 3 days per week  ?  ? ?  ? ?Depression Screen ? ?  09/02/2021  ?  8:34 AM 06/11/2021  ?  8:02 AM 12/06/2020  ?  7:57 AM 07/11/2020  ? 10:19 AM 06/21/2020  ?  8:11 AM 01/09/2020  ? 11:22 AM 12/15/2019  ?  7:59 AM  ?PHQ 2/9 Scores  ?PHQ - 2 Score 0 0 0 0 0 0 0  ?PHQ- 9 Score  0 0 0 0 0 0  ?  ?Fall Risk ? ?  09/02/2021  ?  8:36 AM 06/11/2021  ?  7:59 AM 01/09/2020  ? 11:22 AM 12/15/2019  ?  7:58 AM 06/27/2019  ?  8:05 AM  ?Fall Risk   ?Falls in the past year? 0 0 0 0 0  ?Number falls in past yr: 0 0     ?Injury with Fall? 0 0     ?Risk for fall due to : No Fall Risks No Fall Risks No Fall Risks    ?Follow up Falls prevention discussed Falls evaluation completed Falls evaluation completed Falls evaluation completed Falls evaluation completed  ? ? ?FALL RISK PREVENTION PERTAINING TO THE HOME: ? ?Any stairs in  or around the home? Yes  ?If so, are there any without handrails? No  ?Home free of loose throw rugs in walkways, pet beds, electrical cords, etc? Yes  ?Adequate lighting in your home to reduce risk of

## 2021-09-02 NOTE — Patient Instructions (Signed)
Ms. Harting , ?Thank you for taking time to come for your Medicare Wellness Visit. I appreciate your ongoing commitment to your health goals. Please review the following plan we discussed and let me know if I can assist you in the future.  ? ?Screening recommendations/referrals: ?Colonoscopy: done 08/11/17. Repeat 08/2027 ?Mammogram: done 10/29/20 ?Bone Density: done 10/29/20 ?Recommended yearly ophthalmology/optometry visit for glaucoma screening and checkup ?Recommended yearly dental visit for hygiene and checkup ? ?Vaccinations: ?Influenza vaccine: done 05/04/21  ?Pneumococcal vaccine: done 06/11/21 ?Tdap vaccine: done 07/02/16 ?Shingles vaccine: Shingrix discussed. Please contact your pharmacy for coverage information.  ?Covid-19: done 06/23/19, 07/21/19, 04/19/20 & 03/18/21 ? ?Advanced directives: Advance directive discussed with you today. I have provided a copy for you to complete at home and have notarized. Once this is complete please bring a copy in to our office so we can scan it into your chart.  ? ?Conditions/risks identified: Recommend increasing physical activity to at least 3 days per week  ? ?Next appointment: Follow up in one year for your annual wellness visit  ? ? ?Preventive Care 75 Years and Older, Female ?Preventive care refers to lifestyle choices and visits with your health care provider that can promote health and wellness. ?What does preventive care include? ?A yearly physical exam. This is also called an annual well check. ?Dental exams once or twice a year. ?Routine eye exams. Ask your health care provider how often you should have your eyes checked. ?Personal lifestyle choices, including: ?Daily care of your teeth and gums. ?Regular physical activity. ?Eating a healthy diet. ?Avoiding tobacco and drug use. ?Limiting alcohol use. ?Practicing safe sex. ?Taking low-dose aspirin every day. ?Taking vitamin and mineral supplements as recommended by your health care provider. ?What happens during an annual  well check? ?The services and screenings done by your health care provider during your annual well check will depend on your age, overall health, lifestyle risk factors, and family history of disease. ?Counseling  ?Your health care provider may ask you questions about your: ?Alcohol use. ?Tobacco use. ?Drug use. ?Emotional well-being. ?Home and relationship well-being. ?Sexual activity. ?Eating habits. ?History of falls. ?Memory and ability to understand (cognition). ?Work and work Statistician. ?Reproductive health. ?Screening  ?You may have the following tests or measurements: ?Height, weight, and BMI. ?Blood pressure. ?Lipid and cholesterol levels. These may be checked every 5 years, or more frequently if you are over 26 years old. ?Skin check. ?Lung cancer screening. You may have this screening every year starting at age 46 if you have a 30-pack-year history of smoking and currently smoke or have quit within the past 15 years. ?Fecal occult blood test (FOBT) of the stool. You may have this test every year starting at age 33. ?Flexible sigmoidoscopy or colonoscopy. You may have a sigmoidoscopy every 5 years or a colonoscopy every 10 years starting at age 74. ?Hepatitis C blood test. ?Hepatitis B blood test. ?Sexually transmitted disease (STD) testing. ?Diabetes screening. This is done by checking your blood sugar (glucose) after you have not eaten for a while (fasting). You may have this done every 1-3 years. ?Bone density scan. This is done to screen for osteoporosis. You may have this done starting at age 29. ?Mammogram. This may be done every 1-2 years. Talk to your health care provider about how often you should have regular mammograms. ?Talk with your health care provider about your test results, treatment options, and if necessary, the need for more tests. ?Vaccines  ?Your health care provider may  recommend certain vaccines, such as: ?Influenza vaccine. This is recommended every year. ?Tetanus, diphtheria,  and acellular pertussis (Tdap, Td) vaccine. You may need a Td booster every 10 years. ?Zoster vaccine. You may need this after age 36. ?Pneumococcal 13-valent conjugate (PCV13) vaccine. One dose is recommended after age 45. ?Pneumococcal polysaccharide (PPSV23) vaccine. One dose is recommended after age 68. ?Talk to your health care provider about which screenings and vaccines you need and how often you need them. ?This information is not intended to replace advice given to you by your health care provider. Make sure you discuss any questions you have with your health care provider. ?Document Released: 06/22/2015 Document Revised: 02/13/2016 Document Reviewed: 03/27/2015 ?Elsevier Interactive Patient Education ? 2017 [employer]. ? ?Fall Prevention in the Home ?Falls can cause injuries. They can happen to people of all ages. There are many things you can do to make your home safe and to help prevent falls. ?What can I do on the outside of my home? ?Regularly fix the edges of walkways and driveways and fix any cracks. ?Remove anything that might make you trip as you walk through a door, such as a raised step or threshold. ?Trim any bushes or trees on the path to your home. ?Use bright outdoor lighting. ?Clear any walking paths of anything that might make someone trip, such as rocks or tools. ?Regularly check to see if handrails are loose or broken. Make sure that both sides of any steps have handrails. ?Any raised decks and porches should have guardrails on the edges. ?Have any leaves, snow, or ice cleared regularly. ?Use sand or salt on walking paths during winter. ?Clean up any spills in your garage right away. This includes oil or grease spills. ?What can I do in the bathroom? ?Use night lights. ?Install grab bars by the toilet and in the tub and shower. Do not use towel bars as grab bars. ?Use non-skid mats or decals in the tub or shower. ?If you need to sit down in the shower, use a plastic, non-slip  stool. ?Keep the floor dry. Clean up any water that spills on the floor as soon as it happens. ?Remove soap buildup in the tub or shower regularly. ?Attach bath mats securely with double-sided non-slip rug tape. ?Do not have throw rugs and other things on the floor that can make you trip. ?What can I do in the bedroom? ?Use night lights. ?Make sure that you have a light by your bed that is easy to reach. ?Do not use any sheets or blankets that are too big for your bed. They should not hang down onto the floor. ?Have a firm chair that has side arms. You can use this for support while you get dressed. ?Do not have throw rugs and other things on the floor that can make you trip. ?What can I do in the kitchen? ?Clean up any spills right away. ?Avoid walking on wet floors. ?Keep items that you use a lot in easy-to-reach places. ?If you need to reach something above you, use a strong step stool that has a grab bar. ?Keep electrical cords out of the way. ?Do not use floor polish or wax that makes floors slippery. If you must use wax, use non-skid floor wax. ?Do not have throw rugs and other things on the floor that can make you trip. ?What can I do with my stairs? ?Do not leave any items on the stairs. ?Make sure that there are handrails on both sides of  the stairs and use them. Fix handrails that are broken or loose. Make sure that handrails are as long as the stairways. ?Check any carpeting to make sure that it is firmly attached to the stairs. Fix any carpet that is loose or worn. ?Avoid having throw rugs at the top or bottom of the stairs. If you do have throw rugs, attach them to the floor with carpet tape. ?Make sure that you have a light switch at the top of the stairs and the bottom of the stairs. If you do not have them, ask someone to add them for you. ?What else can I do to help prevent falls? ?Wear shoes that: ?Do not have high heels. ?Have rubber bottoms. ?Are comfortable and fit you well. ?Are closed at the  toe. Do not wear sandals. ?If you use a stepladder: ?Make sure that it is fully opened. Do not climb a closed stepladder. ?Make sure that both sides of the stepladder are locked into place. ?Ask someone to hold

## 2021-09-10 DIAGNOSIS — Z96652 Presence of left artificial knee joint: Secondary | ICD-10-CM | POA: Diagnosis not present

## 2021-09-27 ENCOUNTER — Other Ambulatory Visit: Payer: Self-pay | Admitting: Family Medicine

## 2021-09-27 DIAGNOSIS — Z1231 Encounter for screening mammogram for malignant neoplasm of breast: Secondary | ICD-10-CM

## 2021-10-30 ENCOUNTER — Ambulatory Visit
Admission: RE | Admit: 2021-10-30 | Discharge: 2021-10-30 | Disposition: A | Discharge status: Home / Self Care | Payer: Medicare HMO | Source: Ambulatory Visit | Attending: Family Medicine | Admitting: Family Medicine

## 2021-10-30 DIAGNOSIS — Z1231 Encounter for screening mammogram for malignant neoplasm of breast: Secondary | ICD-10-CM | POA: Diagnosis not present

## 2021-10-31 ENCOUNTER — Other Ambulatory Visit: Payer: Self-pay | Admitting: Family Medicine

## 2021-10-31 DIAGNOSIS — R928 Other abnormal and inconclusive findings on diagnostic imaging of breast: Secondary | ICD-10-CM

## 2021-10-31 DIAGNOSIS — N6489 Other specified disorders of breast: Secondary | ICD-10-CM

## 2021-11-27 ENCOUNTER — Ambulatory Visit
Admission: RE | Admit: 2021-11-27 | Discharge: 2021-11-27 | Disposition: A | Discharge status: Home / Self Care | Payer: Medicare HMO | Source: Ambulatory Visit | Attending: Family Medicine | Admitting: Family Medicine

## 2021-11-27 DIAGNOSIS — R928 Other abnormal and inconclusive findings on diagnostic imaging of breast: Secondary | ICD-10-CM | POA: Diagnosis not present

## 2021-11-27 DIAGNOSIS — N6489 Other specified disorders of breast: Secondary | ICD-10-CM | POA: Insufficient documentation

## 2021-12-02 ENCOUNTER — Ambulatory Visit (INDEPENDENT_AMBULATORY_CARE_PROVIDER_SITE_OTHER): Payer: Medicare HMO | Admitting: Family Medicine

## 2021-12-02 ENCOUNTER — Encounter: Payer: Self-pay | Admitting: Family Medicine

## 2021-12-02 VITALS — BP 130/78 | HR 59 | Ht 62.0 in | Wt 227.0 lb

## 2021-12-02 DIAGNOSIS — I10 Essential (primary) hypertension: Secondary | ICD-10-CM

## 2021-12-02 MED ORDER — AMLODIPINE BESY-BENAZEPRIL HCL 5-10 MG PO CAPS: ORAL_CAPSULE | ORAL | 1 refills | Status: DC

## 2021-12-03 LAB — RENAL FUNCTION PANEL
Albumin: 4.5 g/dL (ref 3.8–4.8)
BUN/Creatinine Ratio: 17 (ref 12–28)
BUN: 13 mg/dL (ref 8–27)
CO2: 21 mmol/L (ref 20–29)
Calcium: 9 mg/dL (ref 8.7–10.3)
Chloride: 106 mmol/L (ref 96–106)
Creatinine, Ser: 0.78 mg/dL (ref 0.57–1.00)
Glucose: 94 mg/dL (ref 70–99)
Phosphorus: 4 mg/dL (ref 3.0–4.3)
Potassium: 4.8 mmol/L (ref 3.5–5.2)
Sodium: 144 mmol/L (ref 134–144)
eGFR: 84 mL/min/{1.73_m2} (ref >59)

## 2021-12-09 ENCOUNTER — Ambulatory Visit: Payer: Medicare HMO | Admitting: Family Medicine

## 2022-01-17 ENCOUNTER — Other Ambulatory Visit: Payer: Self-pay

## 2022-01-17 ENCOUNTER — Ambulatory Visit: Payer: Self-pay | Admitting: *Deleted

## 2022-01-17 ENCOUNTER — Encounter: Payer: Self-pay | Admitting: Family Medicine

## 2022-01-17 ENCOUNTER — Ambulatory Visit (INDEPENDENT_AMBULATORY_CARE_PROVIDER_SITE_OTHER): Payer: Medicare HMO | Admitting: Family Medicine

## 2022-01-17 VITALS — BP 124/76 | HR 96 | Ht 62.0 in | Wt 229.0 lb

## 2022-01-17 DIAGNOSIS — K5900 Constipation, unspecified: Secondary | ICD-10-CM | POA: Diagnosis not present

## 2022-01-17 DIAGNOSIS — K602 Anal fissure, unspecified: Secondary | ICD-10-CM

## 2022-01-17 MED ORDER — HYDROCORTISONE (PERIANAL) 2.5 % EX CREA: 1.0000 | TOPICAL_CREAM | Freq: Two times a day (BID) | CUTANEOUS | 0 refills | Status: DC

## 2022-01-17 MED ORDER — HYDROCORTISONE ACETATE 25 MG RE SUPP: 25.0000 mg | Freq: Two times a day (BID) | RECTAL | 0 refills | Status: DC

## 2022-01-17 NOTE — Progress Notes (Unsigned)
Date:  01/17/2022   Name:  Shannon Blake   DOB:  Jun 13, 1955   MRN:  [id number]   Chief Complaint: hemmorrhoids (New Problem. Painful. Hurts to have a BM. Started 3 days ago.)  Patient is a 66 year old female who presents for a rectal pain exam. The patient reports the following problems: ? hemorrhoid. Health maintenance has been reviewed up to date.      Lab Results  Component Value Date   NA 144 12/02/2021   K 4.8 12/02/2021   CO2 21 12/02/2021   GLUCOSE 94 12/02/2021   BUN 13 12/02/2021   CREATININE 0.78 12/02/2021   CALCIUM 9.0 12/02/2021   EGFR 84 12/02/2021   GFRNONAA 40 (L) 08/13/2021   Lab Results  Component Value Date   CHOL 200 (H) 06/11/2021   HDL 54 06/11/2021   LDLCALC 130 (H) 06/11/2021   TRIG 90 06/11/2021   CHOLHDL 3.7 01/03/2019   Lab Results  Component Value Date   TSH 2.30 12/28/2013   Lab Results  Component Value Date   HGBA1C 5.8 12/28/2013   Lab Results  Component Value Date   WBC 5.4 08/13/2021   HGB 12.2 08/13/2021   HCT 37.6 08/13/2021   MCV 83.0 08/13/2021   PLT 374 08/13/2021   Lab Results  Component Value Date   ALT 24 08/13/2021   AST 22 08/13/2021   ALKPHOS 60 08/13/2021   BILITOT 0.2 (L) 08/13/2021   No results found for: "25OHVITD2", "25OHVITD3", "VD25OH"   Review of Systems  Constitutional:  Negative for chills and fever.  HENT:  Negative for drooling, ear discharge, ear pain and sore throat.   Respiratory:  Negative for cough, shortness of breath and wheezing.   Cardiovascular:  Negative for chest pain, palpitations and leg swelling.  Gastrointestinal:  Positive for rectal pain. Negative for abdominal pain, anal bleeding, blood in stool, constipation, diarrhea and nausea.  Endocrine: Negative for polydipsia.  Genitourinary:  Negative for dysuria, frequency, hematuria and urgency.  Musculoskeletal:  Negative for back pain, myalgias and neck pain.  Skin:  Negative for rash.  Allergic/Immunologic: Negative for  environmental allergies.  Neurological:  Negative for dizziness and headaches.  Hematological:  Does not bruise/bleed easily.  Psychiatric/Behavioral:  Negative for suicidal ideas. The patient is not nervous/anxious.     Patient Active Problem List   Diagnosis Date Noted   BMI 40.0-44.9, adult ([place]) 06/27/2019   Familial hypercholesterolemia 06/27/2019   Special screening for malignant neoplasms, colon    Polyp of sigmoid colon    Family history of peritoneal cancer 07/20/2017   Status post total left knee replacement 08/27/2015   Calculus of kidney 10/17/2014   Alimentary obesity 10/17/2014   Cough 10/17/2014   Essential (primary) hypertension 10/17/2014   Combined fat and carbohydrate induced hyperlipemia 10/17/2014   Derangement of knee 12/01/2013   Arthritis of knee, degenerative 12/01/2013    No Known Allergies  Past Surgical History:  Procedure Laterality Date   CHOLECYSTECTOMY     COLONOSCOPY  2007   Dr Candace Cruise   COLONOSCOPY WITH PROPOFOL N/A 08/11/2017   Procedure: COLONOSCOPY WITH PROPOFOL;  Surgeon: Lucilla Lame, MD;  [facility];  Service: Endoscopy;  Laterality: N/A;   GALLBLADDER SURGERY     KNEE ARTHROPLASTY Left 08/27/2015   Procedure: COMPUTER ASSISTED TOTAL KNEE ARTHROPLASTY;  Surgeon: Dereck Leep, MD;  Location: [facility] ORS;  Service: Orthopedics;  Laterality: Left;   meniscal surgery Left     Social History  Tobacco Use   Smoking status: Never   Smokeless tobacco: Never  Vaping Use   Vaping Use: Never used  Substance Use Topics   Alcohol use: No    Alcohol/week: 0.0 standard drinks of alcohol   Drug use: No     Medication list has been reviewed and updated.  Current Meds  Medication Sig   amLODipine-benazepril (LOTREL) 5-10 MG capsule TAKE 1 CAPSULE BY MOUTH EVERY DAY   aspirin 81 MG chewable tablet Chew 81 mg by mouth daily.    Calcium Carb-Cholecalciferol (CALCIUM 600 + D PO) Take 1 tablet by mouth daily.   Multiple  Vitamins-Minerals (CENTRUM SILVER ULTRA WOMENS) TABS Take 1 tablet by mouth daily.       12/02/2021    8:24 AM 06/11/2021    8:02 AM 12/06/2020    7:57 AM 07/11/2020   10:19 AM  GAD 7 : Generalized Anxiety Score  Nervous, Anxious, on Edge 0 0 0 0  Control/stop worrying 0 0 0 0  Worry too much - different things 0 0 0 0  Trouble relaxing 0 0 0 0  Restless 0 0 0 0  Easily annoyed or irritable 0 0 0 0  Afraid - awful might happen 0 0 0 0  Total GAD 7 Score 0 0 0 0  Anxiety Difficulty Not difficult at all Not difficult at all         12/02/2021    8:24 AM 09/02/2021    8:34 AM 06/11/2021    8:02 AM  Depression screen PHQ 2/9  Decreased Interest 0 0 0  Down, Depressed, Hopeless 0 0 0  PHQ - 2 Score 0 0 0  Altered sleeping 0  0  Tired, decreased energy 0  0  Change in appetite 0  0  Feeling bad or failure about yourself  0  0  Trouble concentrating 0  0  Moving slowly or fidgety/restless 0  0  Suicidal thoughts 0  0  PHQ-9 Score 0  0  Difficult doing work/chores Not difficult at all  Not difficult at all    BP Readings from Last 3 Encounters:  01/17/22 124/76  12/02/21 130/78  08/13/21 114/74    Physical Exam Vitals and nursing note reviewed.  HENT:     Right Ear: Tympanic membrane normal.     Left Ear: Tympanic membrane normal.     Nose: Nose normal.  Cardiovascular:     Heart sounds: No murmur heard.    No friction rub. No gallop.  Pulmonary:     Breath sounds: No wheezing or rhonchi.  Abdominal:     Tenderness: There is no abdominal tenderness. There is no guarding.  Genitourinary:    Rectum: Guaiac result negative. Tenderness present. No mass.  Musculoskeletal:     Cervical back: Normal range of motion.  Neurological:     Mental Status: She is alert.     Wt Readings from Last 3 Encounters:  01/17/22 229 lb (103.9 kg)  12/02/21 227 lb (103 kg)  08/13/21 225 lb 15.5 oz (102.5 kg)    BP 124/76   Pulse 96   Ht '5\' 2"'  (1.575 m)   Wt 229 lb (103.9 kg)    SpO2 97%   BMI 41.88 kg/m   Assessment and Plan:     Otilio Miu, MD

## 2022-01-17 NOTE — Telephone Encounter (Signed)
  Chief Complaint: severe rectal pain from hemorrhoid Symptoms: severe pain difficulty having bm LBM 01/16/22 Frequency: 3 days ago  Pertinent Negatives: Patient denies rectal bleeding or protruding out of anus  Disposition: '[]'$ ED /'[]'$ Urgent Care (no appt availability in office) / '[x]'$ Appointment(In office/virtual)/ '[]'$  [place] [facility]/ '[]'$ Home Care/ '[]'$ Refused Recommended Disposition /'[]'$ [place] [facility]/ '[]'$  Follow-up with PCP Additional Notes:   Na   Reason for Disposition  SEVERE rectal pain (e.g., excruciating, unable to have a bowel movement)  Answer Assessment - Initial Assessment Questions 1. SYMPTOM:  "What's the main symptom you're concerned about?" (e.g., pain, itching, swelling, rash)     Pain from hemorrhoid 2. ONSET: "When did the pain  start?"     3 days ago  3. RECTAL PAIN: "Do you have any pain around your rectum?" "How bad is the pain?"  (Scale 0-10; or mild, moderate, severe)   - NONE (0): no pain   - MILD (1-3): doesn't interfere with normal activities    - MODERATE (4-7): interferes with normal activities or awakens from sleep, limping    - SEVERE (8-10): excruciating pain, unable to have a bowel movement      severe 4. RECTAL ITCHING: "Do you have any itching in this area?" "How bad is the itching?"  (Scale 0-10; or mild, moderate, severe)   - NONE: no itching   - MILD: doesn't interfere with normal activities    - MODERATE-SEVERE: interferes with normal activities or awakens from sleep     na 5. CONSTIPATION: "Do you have constipation?" If Yes, ask: "How bad is it?"     no 6. CAUSE: "What do you think is causing the anus symptoms?"     hemorrhoid 7. OTHER SYMPTOMS: "Do you have any other symptoms?"  (e.g., abdomen pain, fever, rectal bleeding, vomiting)     Rectal pain  8. PREGNANCY: "Is there any chance you are pregnant?" "When was your last menstrual period?"     na  Protocols used: Rectal Symptoms-A-AH

## 2022-01-17 NOTE — Patient Instructions (Signed)
Anal Fissure, Adult  An anal fissure is a small tear or crack in the tissue of the anus. Bleeding from a fissure usually stops on its own within a few minutes. However, bleeding will often occur again with each bowel movement until the fissure heals. What are the causes? This condition is usually caused by passing a large or hard stool (feces). Other causes include: Constipation. Frequent diarrhea. Inflammatory bowel disease (Crohn's disease or ulcerative colitis). Childbirth. Infections. Anal sex. What are the signs or symptoms? Symptoms of this condition include: Bleeding from the rectum. Small amounts of blood seen on your stool, on the toilet paper, or in the toilet after a bowel movement. The blood coats the outside of the stool and is not mixed with the stool. Painful bowel movements. Itching or irritation around the anus. How is this diagnosed? A health care provider may diagnose this condition by closely examining the anal area. An anal fissure can usually be seen with careful inspection. In some cases, a rectal exam may be performed, or a short tube (anoscope) may be used to examine the anal canal. How is this treated? Initial treatment for this condition may include: Taking steps to avoid constipation. This may include making changes to your diet, such as increasing your intake of fiber or fluid. Taking fiber supplements. These supplements can soften your stool to help make bowel movements easier. Your health care provider may also prescribe a stool softener if your stool is hard. Taking sitz baths. This may help to heal the tear. Using medicated creams or ointments. These may be prescribed to lessen discomfort. Treatments that are sometimes used if initial treatments do not work well or if the condition is more severe may include: Botulinum injection. Surgery to repair the fissure. Follow these instructions at home: Eating and drinking  Avoid foods that may cause  constipation, such as bananas, milk, and other dairy products. Eat all fruits, except bananas. Drink enough fluid to keep your urine pale yellow. Eat foods that are high in fiber, such as beans, whole grains, and fresh fruits and vegetables. General instructions  Take over-the-counter and prescription medicines only as told by your health care provider. Use creams or ointments only as told by your health care provider. Keep the anal area clean and dry. Take sitz baths as told by your health care provider. Do not use soap in the sitz baths. Keep all follow-up visits as told by your health care provider. This is important. Contact a health care provider if you have: More bleeding. A fever. Diarrhea that is mixed with blood. Pain that continues. Ongoing problems that are getting worse rather than better. Summary An anal fissure is a small tear or crack in the tissue of the anus. This condition is usually caused by passing a large or hard stool (feces). Other causes include constipation and frequent diarrhea. Initial treatment for this condition may include taking steps to avoid constipation, such as increasing your intake of fiber or fluid. Follow instructions for care as told by your health care provider. Contact your health care provider if you have more bleeding or your problem is getting worse rather than better. Keep all follow-up visits as told by your health care provider. This is important. This information is not intended to replace advice given to you by your health care provider. Make sure you discuss any questions you have with your health care provider. Document Revised: 12/20/2020 Document Reviewed: 12/20/2020 Elsevier Patient Education  [place].

## 2022-03-14 DIAGNOSIS — H524 Presbyopia: Secondary | ICD-10-CM | POA: Diagnosis not present

## 2022-03-14 DIAGNOSIS — Z0100 Encounter for examination of eyes and vision without abnormal findings: Secondary | ICD-10-CM | POA: Diagnosis not present

## 2022-03-24 ENCOUNTER — Encounter: Payer: Self-pay | Admitting: Family Medicine

## 2022-03-24 ENCOUNTER — Ambulatory Visit (INDEPENDENT_AMBULATORY_CARE_PROVIDER_SITE_OTHER): Payer: Medicare HMO | Admitting: Family Medicine

## 2022-03-24 VITALS — BP 110/78 | HR 86 | Temp 98.5°F | Ht 62.0 in | Wt 229.0 lb

## 2022-03-24 DIAGNOSIS — J0100 Acute maxillary sinusitis, unspecified: Secondary | ICD-10-CM | POA: Diagnosis not present

## 2022-03-24 DIAGNOSIS — R051 Acute cough: Secondary | ICD-10-CM | POA: Diagnosis not present

## 2022-03-24 MED ORDER — GUAIFENESIN-CODEINE 100-10 MG/5ML PO SYRP: 5.0000 mL | ORAL_SOLUTION | Freq: Three times a day (TID) | ORAL | 0 refills | Status: DC | PRN

## 2022-03-24 MED ORDER — AMOXICILLIN 500 MG PO CAPS: 500.0000 mg | ORAL_CAPSULE | Freq: Three times a day (TID) | ORAL | 0 refills | Status: AC

## 2022-03-24 NOTE — Progress Notes (Signed)
Date:  03/24/2022   Name:  Shannon Blake   DOB:  07/18/1955   MRN:  [id number]   Chief Complaint: Cough (Congestion- yellow gets worse at night when laying down. No fever, facial pressure and frontal headache)  Cough This is a new problem. The current episode started in the past 7 days (thursday). The problem has been waxing and waning. The cough is Productive of purulent sputum (yellow). Associated symptoms include nasal congestion, postnasal drip and a sore throat. Pertinent negatives include no chest pain, chills, ear congestion, ear pain, fever, headaches, hemoptysis, myalgias, rash, rhinorrhea, shortness of breath or wheezing. She has tried OTC cough suppressant for the symptoms. The treatment provided mild relief.  Sinusitis This is a new problem. The current episode started in the past 7 days. The problem has been waxing and waning since onset. There has been no fever. The fever has been present for Less than 1 day. The pain is moderate. Associated symptoms include coughing and a sore throat. Pertinent negatives include no chills, congestion, ear pain, headaches, shortness of breath, sinus pressure or sneezing. Past treatments include nasal decongestants. The treatment provided no relief.    Lab Results  Component Value Date   NA 144 12/02/2021   K 4.8 12/02/2021   CO2 21 12/02/2021   GLUCOSE 94 12/02/2021   BUN 13 12/02/2021   CREATININE 0.78 12/02/2021   CALCIUM 9.0 12/02/2021   EGFR 84 12/02/2021   GFRNONAA 40 (L) 08/13/2021   Lab Results  Component Value Date   CHOL 200 (H) 06/11/2021   HDL 54 06/11/2021   LDLCALC 130 (H) 06/11/2021   TRIG 90 06/11/2021   CHOLHDL 3.7 01/03/2019   Lab Results  Component Value Date   TSH 2.30 12/28/2013   Lab Results  Component Value Date   HGBA1C 5.8 12/28/2013   Lab Results  Component Value Date   WBC 5.4 08/13/2021   HGB 12.2 08/13/2021   HCT 37.6 08/13/2021   MCV 83.0 08/13/2021   PLT 374 08/13/2021   Lab Results   Component Value Date   ALT 24 08/13/2021   AST 22 08/13/2021   ALKPHOS 60 08/13/2021   BILITOT 0.2 (L) 08/13/2021   No results found for: "25OHVITD2", "25OHVITD3", "VD25OH"   Review of Systems  Constitutional: Negative.  Negative for chills, fatigue, fever and unexpected weight change.  HENT:  Positive for postnasal drip and sore throat. Negative for congestion, ear discharge, ear pain, rhinorrhea, sinus pressure and sneezing.   Respiratory:  Positive for cough. Negative for hemoptysis, shortness of breath, wheezing and stridor.   Cardiovascular:  Negative for chest pain.  Gastrointestinal:  Negative for abdominal pain, blood in stool, constipation, diarrhea and nausea.  Genitourinary:  Negative for dysuria, flank pain, frequency, hematuria, urgency and vaginal discharge.  Musculoskeletal:  Negative for arthralgias, back pain and myalgias.  Skin:  Negative for rash.  Neurological:  Negative for dizziness, weakness and headaches.  Hematological:  Negative for adenopathy. Does not bruise/bleed easily.  Psychiatric/Behavioral:  Negative for dysphoric mood. The patient is not nervous/anxious.     Patient Active Problem List   Diagnosis Date Noted   BMI 40.0-44.9, adult ([place]) 06/27/2019   Familial hypercholesterolemia 06/27/2019   Special screening for malignant neoplasms, colon    Polyp of sigmoid colon    Family history of peritoneal cancer 07/20/2017   Status post total left knee replacement 08/27/2015   Calculus of kidney 10/17/2014   Alimentary obesity 10/17/2014   Cough 10/17/2014  Essential (primary) hypertension 10/17/2014   Combined fat and carbohydrate induced hyperlipemia 10/17/2014   Derangement of knee 12/01/2013   Arthritis of knee, degenerative 12/01/2013    No Known Allergies  Past Surgical History:  Procedure Laterality Date   CHOLECYSTECTOMY     COLONOSCOPY  2007   Dr Candace Cruise   COLONOSCOPY WITH PROPOFOL N/A 08/11/2017   Procedure: COLONOSCOPY WITH PROPOFOL;   Surgeon: Lucilla Lame, MD;  Location: [facility] ENDOSCOPY;  Service: Endoscopy;  Laterality: N/A;   GALLBLADDER SURGERY     KNEE ARTHROPLASTY Left 08/27/2015   Procedure: COMPUTER ASSISTED TOTAL KNEE ARTHROPLASTY;  Surgeon: Dereck Leep, MD;  Location: [facility] ORS;  Service: Orthopedics;  Laterality: Left;   meniscal surgery Left     Social History   Tobacco Use   Smoking status: Never   Smokeless tobacco: Never  Vaping Use   Vaping Use: Never used  Substance Use Topics   Alcohol use: No    Alcohol/week: 0.0 standard drinks of alcohol   Drug use: No     Medication list has been reviewed and updated.  Current Meds  Medication Sig   amLODipine-benazepril (LOTREL) 5-10 MG capsule TAKE 1 CAPSULE BY MOUTH EVERY DAY   aspirin 81 MG chewable tablet Chew 81 mg by mouth daily.    Calcium Carb-Cholecalciferol (CALCIUM 600 + D PO) Take 1 tablet by mouth daily.   Multiple Vitamins-Minerals (CENTRUM SILVER ULTRA WOMENS) TABS Take 1 tablet by mouth daily.       03/24/2022    3:54 PM 12/02/2021    8:24 AM 06/11/2021    8:02 AM 12/06/2020    7:57 AM  GAD 7 : Generalized Anxiety Score  Nervous, Anxious, on Edge 0 0 0 0  Control/stop worrying 0 0 0 0  Worry too much - different things 0 0 0 0  Trouble relaxing 0 0 0 0  Restless 0 0 0 0  Easily annoyed or irritable 0 0 0 0  Afraid - awful might happen 0 0 0 0  Total GAD 7 Score 0 0 0 0  Anxiety Difficulty Not difficult at all Not difficult at all Not difficult at all        03/24/2022    3:53 PM 12/02/2021    8:24 AM 09/02/2021    8:34 AM  Depression screen PHQ 2/9  Decreased Interest 0 0 0  Down, Depressed, Hopeless 0 0 0  PHQ - 2 Score 0 0 0  Altered sleeping 0 0   Tired, decreased energy 0 0   Change in appetite 0 0   Feeling bad or failure about yourself  0 0   Trouble concentrating 0 0   Moving slowly or fidgety/restless 0 0   Suicidal thoughts 0 0   PHQ-9 Score 0 0   Difficult doing work/chores Not difficult at all Not  difficult at all     BP Readings from Last 3 Encounters:  03/24/22 110/78  01/17/22 124/76  12/02/21 130/78    Physical Exam Vitals and nursing note reviewed. Exam conducted with a chaperone present.  Constitutional:      General: She is not in acute distress.    Appearance: She is not diaphoretic.  HENT:     Head: Normocephalic and atraumatic.     Right Ear: External ear normal.     Left Ear: External ear normal.     Nose:     Right Sinus: Maxillary sinus tenderness and frontal sinus tenderness present.  Left Sinus: Maxillary sinus tenderness and frontal sinus tenderness present.     Mouth/Throat:     Mouth: Mucous membranes are moist.  Eyes:     General:        Right eye: No discharge.        Left eye: No discharge.     Conjunctiva/sclera: Conjunctivae normal.     Pupils: Pupils are equal, round, and reactive to light.  Neck:     Thyroid: No thyromegaly.     Vascular: No JVD.  Cardiovascular:     Rate and Rhythm: Normal rate and regular rhythm.     Heart sounds: Normal heart sounds. No murmur heard.    No friction rub. No gallop.  Pulmonary:     Effort: Pulmonary effort is normal.     Breath sounds: Normal breath sounds. No decreased breath sounds, wheezing, rhonchi or rales.  Abdominal:     General: Bowel sounds are normal.     Palpations: Abdomen is soft. There is no mass.     Tenderness: There is no abdominal tenderness. There is no guarding.  Musculoskeletal:        General: Normal range of motion.     Cervical back: Normal range of motion and neck supple.  Lymphadenopathy:     Cervical: No cervical adenopathy.  Skin:    General: Skin is warm and dry.  Neurological:     Mental Status: She is alert.     Wt Readings from Last 3 Encounters:  03/24/22 229 lb (103.9 kg)  01/17/22 229 lb (103.9 kg)  12/02/21 227 lb (103 kg)    BP 110/78   Pulse 86   Temp 98.5 F (36.9 C) (Oral)   Ht '5\' 2"'  (1.575 m)   Wt 229 lb (103.9 kg)   BMI 41.88 kg/m    Assessment and Plan:     Otilio Miu, MD

## 2022-06-03 ENCOUNTER — Ambulatory Visit (INDEPENDENT_AMBULATORY_CARE_PROVIDER_SITE_OTHER): Payer: Medicare HMO | Admitting: Family Medicine

## 2022-06-03 ENCOUNTER — Encounter: Payer: Self-pay | Admitting: Family Medicine

## 2022-06-03 VITALS — BP 110/76 | HR 58 | Ht 62.0 in | Wt 231.0 lb

## 2022-06-03 DIAGNOSIS — E7801 Familial hypercholesterolemia: Secondary | ICD-10-CM | POA: Diagnosis not present

## 2022-06-03 DIAGNOSIS — Z23 Encounter for immunization: Secondary | ICD-10-CM | POA: Diagnosis not present

## 2022-06-03 DIAGNOSIS — I10 Essential (primary) hypertension: Secondary | ICD-10-CM | POA: Diagnosis not present

## 2022-06-03 MED ORDER — AMLODIPINE BESY-BENAZEPRIL HCL 5-10 MG PO CAPS: ORAL_CAPSULE | ORAL | 1 refills | Status: DC

## 2022-06-03 NOTE — Progress Notes (Signed)
Date:  06/03/2022   Name:  Shannon Blake   DOB:  May 30, 1956   MRN:  [id number]   Chief Complaint: Hypertension and Flu Vaccine  Hypertension This is a chronic problem. The current episode started more than 1 month ago. The problem has been gradually improving since onset. The problem is controlled. Pertinent negatives include no blurred vision, chest pain, headaches, orthopnea, palpitations, peripheral edema, PND or shortness of breath. There are no associated agents to hypertension. Risk factors for coronary artery disease include obesity. Past treatments include calcium channel blockers and ACE inhibitors. The current treatment provides moderate improvement. There are no compliance problems.  There is no history of angina, CAD/MI or CVA. There is no history of chronic renal disease, a hypertension causing med or renovascular disease.    Lab Results  Component Value Date   NA 144 12/02/2021   K 4.8 12/02/2021   CO2 21 12/02/2021   GLUCOSE 94 12/02/2021   BUN 13 12/02/2021   CREATININE 0.78 12/02/2021   CALCIUM 9.0 12/02/2021   EGFR 84 12/02/2021   GFRNONAA 40 (L) 08/13/2021   Lab Results  Component Value Date   CHOL 200 (H) 06/11/2021   HDL 54 06/11/2021   LDLCALC 130 (H) 06/11/2021   TRIG 90 06/11/2021   CHOLHDL 3.7 01/03/2019   Lab Results  Component Value Date   TSH 2.30 12/28/2013   Lab Results  Component Value Date   HGBA1C 5.8 12/28/2013   Lab Results  Component Value Date   WBC 5.4 08/13/2021   HGB 12.2 08/13/2021   HCT 37.6 08/13/2021   MCV 83.0 08/13/2021   PLT 374 08/13/2021   Lab Results  Component Value Date   ALT 24 08/13/2021   AST 22 08/13/2021   ALKPHOS 60 08/13/2021   BILITOT 0.2 (L) 08/13/2021   No results found for: "25OHVITD2", "25OHVITD3", "VD25OH"   Review of Systems  Constitutional: Negative.  Negative for chills, fatigue, fever and unexpected weight change.  HENT:  Negative for congestion, ear discharge, ear pain, rhinorrhea, sinus  pressure, sneezing and sore throat.   Eyes:  Negative for blurred vision.  Respiratory:  Negative for cough, shortness of breath, wheezing and stridor.   Cardiovascular:  Negative for chest pain, palpitations, orthopnea and PND.  Gastrointestinal:  Negative for abdominal pain, blood in stool, constipation, diarrhea and nausea.  Genitourinary:  Negative for dysuria, flank pain, frequency, hematuria, urgency and vaginal discharge.  Musculoskeletal:  Negative for arthralgias, back pain and myalgias.  Skin:  Negative for rash.  Neurological:  Negative for dizziness, weakness and headaches.  Hematological:  Negative for adenopathy. Does not bruise/bleed easily.  Psychiatric/Behavioral:  Negative for dysphoric mood. The patient is not nervous/anxious.     Patient Active Problem List   Diagnosis Date Noted   BMI 40.0-44.9, adult ([place]) 06/27/2019   Familial hypercholesterolemia 06/27/2019   Special screening for malignant neoplasms, colon    Polyp of sigmoid colon    Family history of peritoneal cancer 07/20/2017   Status post total left knee replacement 08/27/2015   Calculus of kidney 10/17/2014   Alimentary obesity 10/17/2014   Cough 10/17/2014   Essential (primary) hypertension 10/17/2014   Combined fat and carbohydrate induced hyperlipemia 10/17/2014   Derangement of knee 12/01/2013   Arthritis of knee, degenerative 12/01/2013    No Known Allergies  Past Surgical History:  Procedure Laterality Date   CHOLECYSTECTOMY     COLONOSCOPY  2007   Dr Candace Cruise   COLONOSCOPY WITH PROPOFOL  N/A 08/11/2017   Procedure: COLONOSCOPY WITH PROPOFOL;  Surgeon: Lucilla Lame, MD;  Location: [facility] ENDOSCOPY;  Service: Endoscopy;  Laterality: N/A;   GALLBLADDER SURGERY     KNEE ARTHROPLASTY Left 08/27/2015   Procedure: COMPUTER ASSISTED TOTAL KNEE ARTHROPLASTY;  Surgeon: Dereck Leep, MD;  Location: [facility] ORS;  Service: Orthopedics;  Laterality: Left;   meniscal surgery Left     Social History   Tobacco  Use   Smoking status: Never   Smokeless tobacco: Never  Vaping Use   Vaping Use: Never used  Substance Use Topics   Alcohol use: No    Alcohol/week: 0.0 standard drinks of alcohol   Drug use: No     Medication list has been reviewed and updated.  Current Meds  Medication Sig   amLODipine-benazepril (LOTREL) 5-10 MG capsule TAKE 1 CAPSULE BY MOUTH EVERY DAY   aspirin 81 MG chewable tablet Chew 81 mg by mouth daily.    Calcium Carb-Cholecalciferol (CALCIUM 600 + D PO) Take 1 tablet by mouth daily.   Multiple Vitamins-Minerals (CENTRUM SILVER ULTRA WOMENS) TABS Take 1 tablet by mouth daily.   [DISCONTINUED] hydrocortisone (ANUSOL-HC) 2.5 % rectal cream Place 1 Application rectally 2 (two) times daily.       06/03/2022    8:38 AM 03/24/2022    3:54 PM 12/02/2021    8:24 AM 06/11/2021    8:02 AM  GAD 7 : Generalized Anxiety Score  Nervous, Anxious, on Edge 0 0 0 0  Control/stop worrying 0 0 0 0  Worry too much - different things 0 0 0 0  Trouble relaxing 0 0 0 0  Restless 0 0 0 0  Easily annoyed or irritable 0 0 0 0  Afraid - awful might happen 0 0 0 0  Total GAD 7 Score 0 0 0 0  Anxiety Difficulty Not difficult at all Not difficult at all Not difficult at all Not difficult at all       06/03/2022    8:38 AM 03/24/2022    3:53 PM 12/02/2021    8:24 AM  Depression screen PHQ 2/9  Decreased Interest 0 0 0  Down, Depressed, Hopeless 0 0 0  PHQ - 2 Score 0 0 0  Altered sleeping 0 0 0  Tired, decreased energy 0 0 0  Change in appetite 0 0 0  Feeling bad or failure about yourself  0 0 0  Trouble concentrating 0 0 0  Moving slowly or fidgety/restless 0 0 0  Suicidal thoughts 0 0 0  PHQ-9 Score 0 0 0  Difficult doing work/chores Not difficult at all Not difficult at all Not difficult at all    BP Readings from Last 3 Encounters:  06/03/22 110/76  03/24/22 110/78  01/17/22 124/76    Physical Exam Vitals and nursing note reviewed. Exam conducted with a chaperone  present.  Constitutional:      General: She is not in acute distress.    Appearance: She is not diaphoretic.  HENT:     Head: Normocephalic and atraumatic.     Right Ear: Tympanic membrane and external ear normal.     Left Ear: Tympanic membrane and external ear normal.     Nose: Nose normal.     Mouth/Throat:     Mouth: Mucous membranes are dry.     Pharynx: No oropharyngeal exudate or posterior oropharyngeal erythema.  Eyes:     General:        Right eye: No discharge.  Left eye: No discharge.     Conjunctiva/sclera: Conjunctivae normal.     Pupils: Pupils are equal, round, and reactive to light.  Neck:     Thyroid: No thyromegaly.     Vascular: No JVD.  Cardiovascular:     Rate and Rhythm: Normal rate and regular rhythm.     Chest Wall: PMI is not displaced.     Heart sounds: Normal heart sounds, S1 normal and S2 normal. No murmur heard.    No systolic murmur is present.     No diastolic murmur is present.     No friction rub. No gallop. No S3 or S4 sounds.  Pulmonary:     Effort: Pulmonary effort is normal.     Breath sounds: Normal breath sounds. No wheezing, rhonchi or rales.  Abdominal:     General: Bowel sounds are normal.     Palpations: Abdomen is soft. There is no mass.     Tenderness: There is no abdominal tenderness. There is no guarding.  Musculoskeletal:        General: Normal range of motion.     Cervical back: Normal range of motion and neck supple.  Lymphadenopathy:     Cervical: No cervical adenopathy.  Skin:    General: Skin is warm and dry.  Neurological:     Mental Status: She is alert.     Deep Tendon Reflexes: Reflexes are normal and symmetric.     Wt Readings from Last 3 Encounters:  06/03/22 231 lb (104.8 kg)  03/24/22 229 lb (103.9 kg)  01/17/22 229 lb (103.9 kg)    BP 110/76   Pulse (!) 58   Ht _0  (1.575 m)   Wt 231 lb (104.8 kg)   SpO2 97%   BMI 42.25 kg/m   Assessment and Plan:  1. Essential (primary)  hypertension Chronic.  Controlled.  Stable.  Blood pressure 110/76.  A symptom medic.  Tolerating medication well.  Continue amlodipine 10 benazepril 5-10 mg 1 a day we will recheck in 6 months we will check comprehensive metabolic panel for electrolytes and GFR. - amLODipine-benazepril (LOTREL) 5-10 MG capsule; TAKE 1 CAPSULE BY MOUTH EVERY DAY  Dispense: 90 capsule; Refill: 1 - Comprehensive Metabolic Panel (CMET)  2. Familial hypercholesterolemia .  Diet controlled.  Stable.  Patient has had elevated LDLs in the 130 range for the past 2 readings and we have reemphasized the dietary approach in the meantime patient will check lipid panel and we have discussed the possibility of initiating statin. - Lipid Panel With LDL/HDL Ratio  3. Need for immunization against influenza Discussed and administered - Flu Vaccine QUAD High Dose(Fluad)    Otilio Miu, MD

## 2022-06-03 NOTE — Patient Instructions (Signed)

## 2022-06-04 ENCOUNTER — Other Ambulatory Visit: Payer: Self-pay

## 2022-06-04 DIAGNOSIS — E7801 Familial hypercholesterolemia: Secondary | ICD-10-CM

## 2022-06-04 LAB — LIPID PANEL WITH LDL/HDL RATIO
Cholesterol, Total: 202 mg/dL — ABNORMAL HIGH (ref 100–199)
HDL: 49 mg/dL (ref >39)
LDL Chol Calc (NIH): 133 mg/dL — ABNORMAL HIGH (ref 0–99)
LDL/HDL Ratio: 2.7 ratio (ref 0.0–3.2)
Triglycerides: 109 mg/dL (ref 0–149)
VLDL Cholesterol Cal: 20 mg/dL (ref 5–40)

## 2022-06-04 LAB — COMPREHENSIVE METABOLIC PANEL
ALT: 14 IU/L (ref 0–32)
AST: 15 IU/L (ref 0–40)
Albumin/Globulin Ratio: 1.8 (ref 1.2–2.2)
Albumin: 4.2 g/dL (ref 3.9–4.9)
Alkaline Phosphatase: 98 IU/L (ref 44–121)
BUN/Creatinine Ratio: 22 (ref 12–28)
BUN: 15 mg/dL (ref 8–27)
Bilirubin Total: 0.3 mg/dL (ref 0.0–1.2)
CO2: 22 mmol/L (ref 20–29)
Calcium: 9.8 mg/dL (ref 8.7–10.3)
Chloride: 105 mmol/L (ref 96–106)
Creatinine, Ser: 0.69 mg/dL (ref 0.57–1.00)
Globulin, Total: 2.4 g/dL (ref 1.5–4.5)
Glucose: 83 mg/dL (ref 70–99)
Potassium: 4.7 mmol/L (ref 3.5–5.2)
Sodium: 140 mmol/L (ref 134–144)
Total Protein: 6.6 g/dL (ref 6.0–8.5)
eGFR: 96 mL/min/{1.73_m2} (ref >59)

## 2022-06-04 MED ORDER — ATORVASTATIN CALCIUM 10 MG PO TABS: 10.0000 mg | ORAL_TABLET | Freq: Every day | ORAL | 0 refills | Status: DC

## 2022-06-12 ENCOUNTER — Ambulatory Visit: Payer: Self-pay | Admitting: *Deleted

## 2022-06-12 ENCOUNTER — Ambulatory Visit (INDEPENDENT_AMBULATORY_CARE_PROVIDER_SITE_OTHER): Payer: Medicare HMO | Admitting: Family Medicine

## 2022-06-12 ENCOUNTER — Encounter: Payer: Self-pay | Admitting: Family Medicine

## 2022-06-12 VITALS — BP 100/74 | HR 72 | Ht 62.0 in | Wt 229.0 lb

## 2022-06-12 DIAGNOSIS — R051 Acute cough: Secondary | ICD-10-CM

## 2022-06-12 DIAGNOSIS — Z6841 Body Mass Index (BMI) 40.0 and over, adult: Secondary | ICD-10-CM

## 2022-06-12 DIAGNOSIS — I10 Essential (primary) hypertension: Secondary | ICD-10-CM | POA: Diagnosis not present

## 2022-06-12 DIAGNOSIS — U071 COVID-19: Secondary | ICD-10-CM | POA: Diagnosis not present

## 2022-06-12 LAB — POC COVID19 BINAXNOW: SARS Coronavirus 2 Ag: POSITIVE — AB

## 2022-06-12 MED ORDER — MOLNUPIRAVIR EUA 200MG CAPSULE: 4.0000 | ORAL_CAPSULE | Freq: Two times a day (BID) | ORAL | 0 refills | Status: AC

## 2022-06-12 MED ORDER — PROMETHAZINE-DM 6.25-15 MG/5ML PO SYRP: 5.0000 mL | ORAL_SOLUTION | Freq: Four times a day (QID) | ORAL | 0 refills | Status: DC | PRN

## 2022-06-12 NOTE — Progress Notes (Signed)
Date:  06/12/2022   Name:  Shannon Blake   DOB:  [birth year]   MRN:  [id number]   Chief Complaint: Headache (Sx started Tuesday night -Dry cough, pressure in head, no fever- tested positive yesterday)  Sinusitis This is a new problem. The current episode started in the past 7 days. The problem has been waxing and waning since onset. There has been no fever. The pain is moderate. Associated symptoms include congestion, coughing, ear pain, sinus pressure and a sore throat. Pertinent negatives include no chills, diaphoresis, headaches, hoarse voice, neck pain, shortness of breath, sneezing or swollen glands. Treatments tried: robitussin/dayquil.    Lab Results  Component Value Date   NA 140 06/03/2022   K 4.7 06/03/2022   CO2 22 06/03/2022   GLUCOSE 83 06/03/2022   BUN 15 06/03/2022   CREATININE 0.69 06/03/2022   CALCIUM 9.8 06/03/2022   EGFR 96 06/03/2022   GFRNONAA 40 (L) 08/13/2021   Lab Results  Component Value Date   CHOL 202 (H) 06/03/2022   HDL 49 06/03/2022   LDLCALC 133 (H) 06/03/2022   TRIG 109 06/03/2022   CHOLHDL 3.7 01/03/2019   Lab Results  Component Value Date   TSH 2.30 12/28/2013   Lab Results  Component Value Date   HGBA1C 5.8 12/28/2013   Lab Results  Component Value Date   WBC 5.4 08/13/2021   HGB 12.2 08/13/2021   HCT 37.6 08/13/2021   MCV 83.0 08/13/2021   PLT 374 08/13/2021   Lab Results  Component Value Date   ALT 14 06/03/2022   AST 15 06/03/2022   ALKPHOS 98 06/03/2022   BILITOT 0.3 06/03/2022   No results found for: "25OHVITD2", "25OHVITD3", "VD25OH"   Review of Systems  Constitutional:  Negative for chills and diaphoresis.  HENT:  Positive for congestion, ear pain, sinus pressure and sore throat. Negative for hoarse voice, postnasal drip, rhinorrhea and sneezing.   Respiratory:  Positive for cough. Negative for shortness of breath.   Cardiovascular:  Negative for chest pain and palpitations.  Musculoskeletal:  Negative for neck  pain.  Neurological:  Negative for headaches.    Patient Active Problem List   Diagnosis Date Noted   BMI 40.0-44.9, adult ([place]) 06/27/2019   Familial hypercholesterolemia 06/27/2019   Special screening for malignant neoplasms, colon    Polyp of sigmoid colon    Family history of peritoneal cancer 07/20/2017   Status post total left knee replacement 08/27/2015   Calculus of kidney 10/17/2014   Alimentary obesity 10/17/2014   Cough 10/17/2014   Essential (primary) hypertension 10/17/2014   Combined fat and carbohydrate induced hyperlipemia 10/17/2014   Derangement of knee 12/01/2013   Arthritis of knee, degenerative 12/01/2013    No Known Allergies  Past Surgical History:  Procedure Laterality Date   CHOLECYSTECTOMY     COLONOSCOPY  2007   Dr Candace Cruise   COLONOSCOPY WITH PROPOFOL N/A 08/11/2017   Procedure: COLONOSCOPY WITH PROPOFOL;  Surgeon: Lucilla Lame, MD;  Location: [facility] ENDOSCOPY;  Service: Endoscopy;  Laterality: N/A;   GALLBLADDER SURGERY     KNEE ARTHROPLASTY Left 08/27/2015   Procedure: COMPUTER ASSISTED TOTAL KNEE ARTHROPLASTY;  Surgeon: Dereck Leep, MD;  Location: [facility] ORS;  Service: Orthopedics;  Laterality: Left;   meniscal surgery Left     Social History   Tobacco Use   Smoking status: Never   Smokeless tobacco: Never  Vaping Use   Vaping Use: Never used  Substance Use Topics   Alcohol use:  No    Alcohol/week: 0.0 standard drinks of alcohol   Drug use: No     Medication list has been reviewed and updated.  Current Meds  Medication Sig   amLODipine-benazepril (LOTREL) 5-10 MG capsule TAKE 1 CAPSULE BY MOUTH EVERY DAY   aspirin 81 MG chewable tablet Chew 81 mg by mouth daily.    atorvastatin (LIPITOR) 10 MG tablet Take 1 tablet (10 mg total) by mouth daily.   Calcium Carb-Cholecalciferol (CALCIUM 600 + D PO) Take 1 tablet by mouth daily.   Multiple Vitamins-Minerals (CENTRUM SILVER ULTRA WOMENS) TABS Take 1 tablet by mouth daily.       06/12/2022    11:26 AM 06/03/2022    8:38 AM 03/24/2022    3:54 PM 12/02/2021    8:24 AM  GAD 7 : Generalized Anxiety Score  Nervous, Anxious, on Edge 0 0 0 0  Control/stop worrying 0 0 0 0  Worry too much - different things 0 0 0 0  Trouble relaxing 0 0 0 0  Restless 0 0 0 0  Easily annoyed or irritable 0 0 0 0  Afraid - awful might happen 0 0 0 0  Total GAD 7 Score 0 0 0 0  Anxiety Difficulty Not difficult at all Not difficult at all Not difficult at all Not difficult at all       06/12/2022   11:26 AM 06/03/2022    8:38 AM 03/24/2022    3:53 PM  Depression screen PHQ 2/9  Decreased Interest 0 0 0  Down, Depressed, Hopeless 0 0 0  PHQ - 2 Score 0 0 0  Altered sleeping 0 0 0  Tired, decreased energy 0 0 0  Change in appetite 0 0 0  Feeling bad or failure about yourself  0 0 0  Trouble concentrating 0 0 0  Moving slowly or fidgety/restless 0 0 0  Suicidal thoughts 0 0 0  PHQ-9 Score 0 0 0  Difficult doing work/chores Not difficult at all Not difficult at all Not difficult at all    BP Readings from Last 3 Encounters:  06/12/22 100/74  06/03/22 110/76  03/24/22 110/78    Physical Exam HENT:     Head: Normocephalic.     Right Ear: Hearing, tympanic membrane and ear canal normal. Tympanic membrane is not erythematous, retracted or bulging.     Left Ear: Hearing, tympanic membrane and ear canal normal. Tympanic membrane is not erythematous, retracted or bulging.     Nose:     Right Turbinates: Swollen.     Left Turbinates: Swollen.     Right Sinus: Maxillary sinus tenderness present. No frontal sinus tenderness.     Left Sinus: Maxillary sinus tenderness present. No frontal sinus tenderness.     Mouth/Throat:     Lips: Pink.     Mouth: Mucous membranes are moist.     Pharynx: Oropharynx is clear. Uvula midline. No pharyngeal swelling or posterior oropharyngeal erythema.  Eyes:     General: Lids are normal. Vision grossly intact. Gaze aligned appropriately.     Conjunctiva/sclera:  Conjunctivae normal.  Cardiovascular:     Pulses: Normal pulses.     Heart sounds: S1 normal and S2 normal. No murmur heard.    No systolic murmur is present.     No diastolic murmur is present.     No S3 or S4 sounds.  Pulmonary:     Breath sounds: No decreased breath sounds, wheezing or rhonchi.  Abdominal:  General: Bowel sounds are normal.     Palpations: Abdomen is soft. There is no hepatomegaly or splenomegaly.  Musculoskeletal:     Cervical back: Neck supple.  Neurological:     Mental Status: She is alert.     Wt Readings from Last 3 Encounters:  06/12/22 229 lb (103.9 kg)  06/03/22 231 lb (104.8 kg)  03/24/22 229 lb (103.9 kg)    BP 100/74   Pulse 72   Ht _0  (1.575 m)   Wt 229 lb (103.9 kg)   SpO2 96%   BMI 41.88 kg/m   Assessment and Plan:  1. Acute COVID-19 New onset.  Persistent.  Patient presents with symptomatology of COVID with discrepancy of laboratory values from the day previous.  COVID test was done here for verification and it was positive.  Patient has 2 risk factors and that she is morbid obesity with an age of 37.  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  We will initiate molnupiravir 200 mg 4 capsules twice a day for 5 days and patient has been given information on isolation hydration and overall care and concerns to return to hospital setting if there is worsening of the infection. - molnupiravir EUA (LAGEVRIO) 200 mg CAPS capsule; Take 4 capsules (800 mg total) by mouth 2 (two) times daily for 5 days.  Dispense: 40 capsule; Refill: 0 - POC COVID-19  2. BMI 40.0-44.9, adult [facility]) Patient has a risk factor of BMI of 41.88.  3. Acute cough Acute cough for the most part nonproductive we will provide Promethazine DM teaspoon every 6 hours as needed for cough - promethazine-dextromethorphan (PROMETHAZINE-DM) 6.25-15 MG/5ML syrup; Take 5 mLs by mouth 4 (four) times daily as needed for cough.  Dispense: 118 mL; Refill: 0  4. Essential (primary)  hypertension Patient also hypertension she is on amlodipine with blood pressure reading today 100/74 but she is not presenting with hypertensive symptomatology.  Patient's been encouraged to increase fluid intake as that this may be due to decreased oral intake over the past 24 hours.     Otilio Miu, MD

## 2022-06-12 NOTE — Patient Instructions (Signed)

## 2022-06-12 NOTE — Telephone Encounter (Signed)
Summary: covid   Pt tested positive for covid on wednesday / pt has a cough, congestion and headache and asked if something can be called in / please advise     Chief Complaint: Covid Positive Symptoms: cough, sore throat,headache, body aches Frequency: Symptoms onset Tuesday night Pertinent Negatives: Patient denies fever, SOB Disposition: '[]'$ ED /'[]'$ Urgent Care (no appt availability in office) / '[x]'$ Appointment(In office/virtual)/ '[]'$  [facility]/ '[]'$ Home Care/ '[]'$ Refused Recommended Disposition /'[]'$  Mobile Bus/ '[]'$  Follow-up with PCP Additional Notes: Pt interested in Paxlovid, secured VV today. CAre advise provided, self isolation guidelines reviewed. Pt verbalizes understanding.   Reason for Disposition  [1] HIGH RISK patient (e.g., weak immune system, age > 78 years, obesity with BMI 30 or higher, pregnant, chronic lung disease or other chronic medical condition) AND [2] COVID symptoms (e.g., cough, fever)  (Exceptions: Already seen by PCP and no new or worsening symptoms.)  Answer Assessment - Initial Assessment Questions 1. COVID-19 DIAGNOSIS: "How do you know that you have COVID?" (e.g., positive lab test or self-test, diagnosed by doctor or NP/PA, symptoms after exposure).     Work place SNF 2. [place]: "Was there any known exposure to [place] before the symptoms began?" CDC Definition of close contact: within 6 feet (2 meters) for a total of 15 minutes or more over a 24-hour period.      At work 3. O[place]ET: "When did the COVID-19 symptoms start?"      Tuesday night 4. WORST SYMPTOM: "What is your worst symptom?" (e.g., cough, fever, shortness of breath, muscle aches)      5. COUGH: "Do you have a cough?" If Yes, ask: "How bad is the cough?"       Dry  cough 6. FEVER: "Do you have a fever?" If Yes, ask: "What is your temperature, how was it measured, and when did it start?"     no 7. RESPIRATORY STATUS: "Describe your breathing?" (e.g., normal; shortness of  breath, wheezing, unable to speak)      no 8. BETTER-SAME-WORSE: "Are you getting better, staying the same or getting worse compared to yesterday?"  If getting worse, ask, "In what way?"     Worse 9. OTHER SYMPTOMS: "Do you have any other symptoms?"  (e.g., chills, fatigue, headache, loss of smell or taste, muscle pain, sore throat)     Sore throat, headache, sinus pressure, body aches 10. HIGH RISK DISEASE: "Do you have any chronic medical problems?" (e.g., asthma, heart or lung disease, weak immune system, obesity, etc.)       *No Answer* 11. VACCINE: "Have you had the COVID-19 vaccine?" If Yes, ask: "Which one, how many shots, when did you get it?"       Not the newest one, all others  Protocols used: Coronavirus (COVID-19) Diagnosed or Suspected-A-AH

## 2022-08-05 ENCOUNTER — Encounter: Payer: Self-pay | Admitting: Emergency Medicine

## 2022-08-05 ENCOUNTER — Emergency Department
Admission: EM | Admit: 2022-08-05 | Discharge: 2022-08-05 | Disposition: A | Discharge status: Home / Self Care | Payer: Medicare HMO | Attending: Emergency Medicine | Admitting: Emergency Medicine

## 2022-08-05 DIAGNOSIS — R112 Nausea with vomiting, unspecified: Secondary | ICD-10-CM | POA: Insufficient documentation

## 2022-08-05 DIAGNOSIS — Z1152 Encounter for screening for COVID-19: Secondary | ICD-10-CM | POA: Diagnosis not present

## 2022-08-05 DIAGNOSIS — R197 Diarrhea, unspecified: Secondary | ICD-10-CM | POA: Insufficient documentation

## 2022-08-05 DIAGNOSIS — D72829 Elevated white blood cell count, unspecified: Secondary | ICD-10-CM | POA: Insufficient documentation

## 2022-08-05 DIAGNOSIS — N390 Urinary tract infection, site not specified: Secondary | ICD-10-CM | POA: Diagnosis not present

## 2022-08-05 LAB — URINALYSIS, ROUTINE W REFLEX MICROSCOPIC
Glucose, UA: NEGATIVE mg/dL
Hgb urine dipstick: NEGATIVE
Ketones, ur: 5 mg/dL — AB
Nitrite: NEGATIVE
Protein, ur: >=300 mg/dL — AB
Specific Gravity, Urine: 1.03 (ref 1.005–1.030)
pH: 5 (ref 5.0–8.0)

## 2022-08-05 LAB — COMPREHENSIVE METABOLIC PANEL
ALT: 16 U/L (ref 0–44)
AST: 20 U/L (ref 15–41)
Albumin: 4.2 g/dL (ref 3.5–5.0)
Alkaline Phosphatase: 82 U/L (ref 38–126)
Anion gap: 10 (ref 5–15)
BUN: 23 mg/dL (ref 8–23)
CO2: 24 mmol/L (ref 22–32)
Calcium: 9.6 mg/dL (ref 8.9–10.3)
Chloride: 105 mmol/L (ref 98–111)
Creatinine, Ser: 1.05 mg/dL — ABNORMAL HIGH (ref 0.44–1.00)
GFR, Estimated: 58 mL/min — ABNORMAL LOW (ref >60)
Glucose, Bld: 128 mg/dL — ABNORMAL HIGH (ref 70–99)
Potassium: 4 mmol/L (ref 3.5–5.1)
Sodium: 139 mmol/L (ref 135–145)
Total Bilirubin: 0.5 mg/dL (ref 0.3–1.2)
Total Protein: 7.5 g/dL (ref 6.5–8.1)

## 2022-08-05 LAB — CBC
HCT: 40.3 % (ref 36.0–46.0)
Hemoglobin: 13 g/dL (ref 12.0–15.0)
MCH: 26.7 pg (ref 26.0–34.0)
MCHC: 32.3 g/dL (ref 30.0–36.0)
MCV: 82.9 fL (ref 80.0–100.0)
Platelets: 366 10*3/uL (ref 150–400)
RBC: 4.86 MIL/uL (ref 3.87–5.11)
RDW: 13.3 % (ref 11.5–15.5)
WBC: 14.3 10*3/uL — ABNORMAL HIGH (ref 4.0–10.5)
nRBC: 0 % (ref 0.0–0.2)

## 2022-08-05 LAB — RESP PANEL BY RT-PCR (RSV, FLU A&B, COVID)  RVPGX2
Influenza A by PCR: NEGATIVE
Influenza B by PCR: NEGATIVE
Resp Syncytial Virus by PCR: NEGATIVE
SARS Coronavirus 2 by RT PCR: NEGATIVE

## 2022-08-05 LAB — LIPASE, BLOOD: Lipase: 33 U/L (ref 11–51)

## 2022-08-05 MED ORDER — SODIUM CHLORIDE 0.9 % IV SOLN
1.0000 g | Freq: Once | INTRAVENOUS | Status: AC
  Administered 2022-08-05: 1 g via INTRAVENOUS
  Filled 2022-08-05: qty 10

## 2022-08-05 MED ORDER — SODIUM CHLORIDE 0.9 % IV BOLUS
1000.0000 mL | Freq: Once | INTRAVENOUS | Status: AC
  Administered 2022-08-05: 1000 mL via INTRAVENOUS

## 2022-08-05 MED ORDER — ONDANSETRON HCL 4 MG/2ML IJ SOLN
4.0000 mg | Freq: Once | INTRAMUSCULAR | Status: AC
  Administered 2022-08-05: 4 mg via INTRAVENOUS
  Filled 2022-08-05: qty 2

## 2022-08-05 MED ORDER — ONDANSETRON 4 MG PO TBDP: 4.0000 mg | ORAL_TABLET | Freq: Three times a day (TID) | ORAL | 0 refills | Status: DC | PRN

## 2022-08-05 MED ORDER — CEPHALEXIN 500 MG PO CAPS: 500.0000 mg | ORAL_CAPSULE | Freq: Two times a day (BID) | ORAL | 0 refills | Status: AC

## 2022-08-05 NOTE — Discharge Instructions (Signed)
You were seen in the emergency department for nausea and vomiting, diagnosed with a urinary tract infection.  You were given your first dose of antibiotics in the emergency department and IV fluids.  Start your first dose of antibiotics tomorrow.  Your urine was sent for culture.  Stay hydrated and drink plenty of fluids.  Return to the emergency department for any worsening symptoms.  Follow-up closely with your primary care provider.

## 2022-08-05 NOTE — ED Provider Notes (Signed)
[facility] Provider Note    Event Date/Time   First MD Initiated Contact with Patient 08/05/22 2132     (approximate)   History   Abdominal Pain   HPI  Shannon Blake is a 67 y.o. female presents to the emergency department with 1 day of not feeling well with nausea vomiting and diarrhea.  Patient states that her symptoms started today.  Denies any significant abdominal pain and states that she only has abdominal pain when she goes to throw up.  Denies any blood in her stool.  Denies any back pain.  Denies dysuria but does endorse urinary frequency.  No falls or trauma.  No fever or chills.     Physical Exam   Triage Vital Signs: ED Triage Vitals [08/05/22 2127]  Enc Vitals Group     BP 134/70     Pulse Rate 100     Resp 18     Temp 97.7 F (36.5 C)     Temp Source Oral     SpO2 98 %     Weight 233 lb (105.7 kg)     Height '5\' 2"'$  (1.575 m)     Head Circumference      Peak Flow      Pain Score      Pain Loc      Pain Edu?      Excl. in [place]?     Most recent vital signs: Vitals:   08/05/22 2127 08/05/22 2153  BP: 134/70   Pulse: 100   Resp: 18   Temp: 97.7 F (36.5 C)   SpO2: 98% 99%    Physical Exam Constitutional:      Appearance: She is well-developed.  HENT:     Head: Atraumatic.     Comments: Dry mucous membranes Eyes:     Conjunctiva/sclera: Conjunctivae normal.  Cardiovascular:     Rate and Rhythm: Regular rhythm.  Pulmonary:     Effort: No respiratory distress.  Abdominal:     General: There is no distension.     Tenderness: There is no abdominal tenderness. There is no right CVA tenderness or left CVA tenderness.  Musculoskeletal:        General: Normal range of motion.     Cervical back: Normal range of motion.  Skin:    General: Skin is warm.  Neurological:     Mental Status: She is alert. Mental status is at baseline.     IMPRESSION / MDM / ASSESSMENT AND PLAN / ED COURSE  I reviewed the triage vital signs and  the nursing notes.  Differential diagnosis including viral illness including COVID/influenza, urinary tract infection, dehydration, gastroenteritis, low suspicion for ACS  EKG   No tachycardic or bradycardic dysrhythmias while on cardiac telemetry.  LABS (all labs ordered are listed, but only abnormal results are displayed) Labs interpreted as -    Labs Reviewed  COMPREHENSIVE METABOLIC PANEL - Abnormal; Notable for the following components:      Result Value   Glucose, Bld 128 (*)    Creatinine, Ser 1.05 (*)    GFR, Estimated 58 (*)    All other components within normal limits  CBC - Abnormal; Notable for the following components:   WBC 14.3 (*)    All other components within normal limits  URINALYSIS, ROUTINE W REFLEX MICROSCOPIC - Abnormal; Notable for the following components:   Color, Urine AMBER (*)    APPearance CLOUDY (*)    Bilirubin Urine SMALL (*)  Ketones, ur 5 (*)    Protein, ur >=300 (*)    Leukocytes,Ua SMALL (*)    Bacteria, UA RARE (*)    All other components within normal limits  RESP PANEL BY RT-PCR (RSV, FLU A&B, COVID)  [id number]  URINE CULTURE  LIPASE, BLOOD    TREATMENT  1 L of IV fluids, IV Zofran, IV Rocephin  MDM    Patient with leukocytosis.  Urine appears infected with leukocyte esterase, WBC and bacteria.  Urine culture was obtained.  On chart review no history of a resistant urinary tract infection.  Patient tolerating p.o.  No CVA tenderness, no abdominal tenderness to palpation.  Have a low suspicion for pyelonephritis or infected kidney stone.  No right lower quadrant abdominal tenderness have a low suspicion for acute appendicitis.  No left lower abdominal pain have a low suspicion for acute diverticulitis.  Will start the patient on antibiotics.  Given return precautions and discussed close follow-up with primary care physician.   PROCEDURES:  Critical Care performed: No  Procedures  Patient's presentation is most consistent  with acute presentation with potential threat to life or bodily function.   MEDICATIONS ORDERED IN ED: Medications  cefTRIAXone (ROCEPHIN) 1 g in sodium chloride 0.9 % 100 mL IVPB (1 g Intravenous New Bag/Given 08/05/22 2311)  sodium chloride 0.9 % bolus 1,000 mL (1,000 mLs Intravenous New Bag/Given 08/05/22 2234)  ondansetron (ZOFRAN) injection 4 mg (4 mg Intravenous Given 08/05/22 2230)    FINAL CLINICAL IMPRESSION(S) / ED DIAGNOSES   Final diagnoses:  Nausea vomiting and diarrhea  Urinary tract infection without hematuria, site unspecified     Rx / DC Orders   ED Discharge Orders          Ordered    cephALEXin (KEFLEX) 500 MG capsule  2 times daily        08/05/22 2321    ondansetron (ZOFRAN-ODT) 4 MG disintegrating tablet  Every 8 hours PRN        08/05/22 2321             Note:  This document was prepared using Dragon voice recognition software and may include unintentional dictation errors.   Nathaniel Man, MD 08/05/22 2324

## 2022-08-05 NOTE — ED Notes (Signed)
Pt Dc to home. Dc instructions reviewed with all questions answered. Pt voices understanding. IV removed, cath intact, pressure dressing applied. No bleeding noted at site. Pt ambulatory out of dept with steady gait

## 2022-08-05 NOTE — ED Triage Notes (Signed)
Pt presents via POV with complaints of lower abdominal pain with associated N/V/D that started tonight. Pt states having 5-6 episodes of vomiting in the last hour. Denies hematemesis, or hematochezia, CP or SOB.

## 2022-08-06 ENCOUNTER — Telehealth: Payer: Self-pay

## 2022-08-06 NOTE — Transitions of Care (Post Inpatient/ED Visit) (Signed)
   08/06/2022  Name: Shannon Blake MRN: [id number] DOB: [birth year]  Today's TOC FU Call Status: Today's TOC FU Call Status:: Successful TOC FU Call Competed TOC FU Call Complete Date: 08/06/22  Transition Care Management Follow-up Telephone Call Date of Discharge: 08/05/22 Discharge Facility: [facility]) Type of Discharge: Emergency Department Reason for ED Visit: Other: (GI concerns- had UTI) How have you been since you were released from the hospital?: Better Any questions or concerns?: No  Items Reviewed: Did you receive and understand the discharge instructions provided?: Yes Medications obtained and verified?: Yes (Medications Reviewed) Any new allergies since your discharge?: No Dietary orders reviewed?: NA Do you have support at home?: Yes People in Home: significant other Name of Support/Comfort Primary Source: [facility] and Equipment/Supplies: [place] Ordered?: No Any new equipment or medical supplies ordered?: No  Functional Questionnaire: Do you need assistance with bathing/showering or dressing?: No Do you need assistance with meal preparation?: No Do you need assistance with eating?: No Do you have difficulty maintaining continence: No Do you need assistance with getting out of bed/getting out of a chair/moving?: No Do you have difficulty managing or taking your medications?: No  Folllow up appointments reviewed: PCP Follow-up appointment confirmed?: Yes Date of PCP follow-up appointment?: 08/08/22 Follow-up Provider: [facility] Follow-up appointment confirmed?: NA Do you need transportation to your follow-up appointment?: No Do you understand care options if your condition(s) worsen?: Yes-patient verbalized understanding    SIGNATURE [place]on [place]

## 2022-08-08 ENCOUNTER — Ambulatory Visit (INDEPENDENT_AMBULATORY_CARE_PROVIDER_SITE_OTHER): Payer: Medicare HMO | Admitting: Family Medicine

## 2022-08-08 ENCOUNTER — Encounter: Payer: Self-pay | Admitting: Family Medicine

## 2022-08-08 VITALS — BP 112/78 | HR 84 | Ht 62.0 in | Wt 227.0 lb

## 2022-08-08 DIAGNOSIS — N3000 Acute cystitis without hematuria: Secondary | ICD-10-CM | POA: Diagnosis not present

## 2022-08-08 LAB — POCT URINALYSIS DIPSTICK
Bilirubin, UA: NEGATIVE
Blood, UA: NEGATIVE
Glucose, UA: NEGATIVE
Ketones, UA: NEGATIVE
Leukocytes, UA: NEGATIVE
Nitrite, UA: NEGATIVE
Protein, UA: NEGATIVE
Spec Grav, UA: 1.025 (ref 1.010–1.025)
Urobilinogen, UA: 0.2 E.U./dL
pH, UA: 5 (ref 5.0–8.0)

## 2022-08-08 LAB — URINE CULTURE: Culture: >=100000 — AB

## 2022-08-08 NOTE — Progress Notes (Signed)
Date:  08/08/2022   Name:  Shannon Blake   DOB:  [birth year]   MRN:  [id number]   Chief Complaint: Urinary Tract Infection (Follow up on UTI)  Urinary Tract Infection  This is a chronic problem. The current episode started more than 1 year ago. The problem occurs intermittently. The problem has been gradually improving. The quality of the pain is described as aching. The pain is mild. Pertinent negatives include no chills, frequency, nausea, sweats or urgency. The treatment provided moderate relief.    Lab Results  Component Value Date   NA 139 08/05/2022   K 4.0 08/05/2022   CO2 24 08/05/2022   GLUCOSE 128 (H) 08/05/2022   BUN 23 08/05/2022   CREATININE 1.05 (H) 08/05/2022   CALCIUM 9.6 08/05/2022   EGFR 96 06/03/2022   GFRNONAA 58 (L) 08/05/2022   Lab Results  Component Value Date   CHOL 202 (H) 06/03/2022   HDL 49 06/03/2022   LDLCALC 133 (H) 06/03/2022   TRIG 109 06/03/2022   CHOLHDL 3.7 01/03/2019   Lab Results  Component Value Date   TSH 2.30 12/28/2013   Lab Results  Component Value Date   HGBA1C 5.8 12/28/2013   Lab Results  Component Value Date   WBC 14.3 (H) 08/05/2022   HGB 13.0 08/05/2022   HCT 40.3 08/05/2022   MCV 82.9 08/05/2022   PLT 366 08/05/2022   Lab Results  Component Value Date   ALT 16 08/05/2022   AST 20 08/05/2022   ALKPHOS 82 08/05/2022   BILITOT 0.5 08/05/2022   No results found for: "25OHVITD2", "25OHVITD3", "VD25OH"   Review of Systems  Constitutional:  Negative for chills and unexpected weight change.  HENT:  Negative for congestion, postnasal drip, rhinorrhea, sinus pressure and sinus pain.   Respiratory:  Negative for shortness of breath and stridor.   Cardiovascular:  Negative for chest pain, palpitations and leg swelling.  Gastrointestinal:  Negative for abdominal distention, abdominal pain and nausea.  Genitourinary:  Negative for frequency and urgency.    Patient Active Problem List   Diagnosis Date Noted   BMI  40.0-44.9, adult ([place]) 06/27/2019   Familial hypercholesterolemia 06/27/2019   Special screening for malignant neoplasms, colon    Polyp of sigmoid colon    Family history of peritoneal cancer 07/20/2017   Status post total left knee replacement 08/27/2015   Calculus of kidney 10/17/2014   Alimentary obesity 10/17/2014   Cough 10/17/2014   Essential (primary) hypertension 10/17/2014   Combined fat and carbohydrate induced hyperlipemia 10/17/2014   Derangement of knee 12/01/2013   Arthritis of knee, degenerative 12/01/2013    No Known Allergies  Past Surgical History:  Procedure Laterality Date   CHOLECYSTECTOMY     COLONOSCOPY  2007   Dr Candace Cruise   COLONOSCOPY WITH PROPOFOL N/A 08/11/2017   Procedure: COLONOSCOPY WITH PROPOFOL;  Surgeon: Lucilla Lame, MD;  Location: [facility] ENDOSCOPY;  Service: Endoscopy;  Laterality: N/A;   GALLBLADDER SURGERY     KNEE ARTHROPLASTY Left 08/27/2015   Procedure: COMPUTER ASSISTED TOTAL KNEE ARTHROPLASTY;  Surgeon: Dereck Leep, MD;  Location: [facility] ORS;  Service: Orthopedics;  Laterality: Left;   meniscal surgery Left     Social History   Tobacco Use   Smoking status: Never   Smokeless tobacco: Never  Vaping Use   Vaping Use: Never used  Substance Use Topics   Alcohol use: No    Alcohol/week: 0.0 standard drinks of alcohol   Drug use: No  Medication list has been reviewed and updated.  Current Meds  Medication Sig   amLODipine-benazepril (LOTREL) 5-10 MG capsule TAKE 1 CAPSULE BY MOUTH EVERY DAY   aspirin 81 MG chewable tablet Chew 81 mg by mouth daily.    atorvastatin (LIPITOR) 10 MG tablet Take 1 tablet (10 mg total) by mouth daily.   Calcium Carb-Cholecalciferol (CALCIUM 600 + D PO) Take 1 tablet by mouth daily.   cephALEXin (KEFLEX) 500 MG capsule Take 1 capsule (500 mg total) by mouth 2 (two) times daily for 7 days.   Multiple Vitamins-Minerals (CENTRUM SILVER ULTRA WOMENS) TABS Take 1 tablet by mouth daily.   ondansetron  (ZOFRAN-ODT) 4 MG disintegrating tablet Take 1 tablet (4 mg total) by mouth every 8 (eight) hours as needed for nausea or vomiting.       08/08/2022    1:56 PM 06/12/2022   11:26 AM 06/03/2022    8:38 AM 03/24/2022    3:54 PM  GAD 7 : Generalized Anxiety Score  Nervous, Anxious, on Edge 0 0 0 0  Control/stop worrying 0 0 0 0  Worry too much - different things 0 0 0 0  Trouble relaxing 0 0 0 0  Restless 0 0 0 0  Easily annoyed or irritable 0 0 0 0  Afraid - awful might happen 0 0 0 0  Total GAD 7 Score 0 0 0 0  Anxiety Difficulty Not difficult at all Not difficult at all Not difficult at all Not difficult at all       08/08/2022    1:56 PM 06/12/2022   11:26 AM 06/03/2022    8:38 AM  Depression screen PHQ 2/9  Decreased Interest 0 0 0  Down, Depressed, Hopeless 0 0 0  PHQ - 2 Score 0 0 0  Altered sleeping 0 0 0  Tired, decreased energy 3 0 0  Change in appetite 0 0 0  Feeling bad or failure about yourself  0 0 0  Trouble concentrating 0 0 0  Moving slowly or fidgety/restless 0 0 0  Suicidal thoughts 0 0 0  PHQ-9 Score 3 0 0  Difficult doing work/chores Not difficult at all Not difficult at all Not difficult at all    BP Readings from Last 3 Encounters:  08/08/22 112/78  08/05/22 134/70  06/12/22 100/74    Physical Exam Vitals and nursing note reviewed. Exam conducted with a chaperone present.  Constitutional:      General: She is not in acute distress.    Appearance: She is not diaphoretic.  HENT:     Head: Normocephalic and atraumatic.     Right Ear: Tympanic membrane and external ear normal.     Left Ear: Tympanic membrane and external ear normal.     Nose: Nose normal.  Neck:     Thyroid: No thyromegaly.     Vascular: No JVD.  Cardiovascular:     Rate and Rhythm: Normal rate and regular rhythm.     Heart sounds: Normal heart sounds. No murmur heard.    No friction rub.  Pulmonary:     Effort: Pulmonary effort is normal.     Breath sounds: Normal breath  sounds.  Musculoskeletal:        General: Normal range of motion.     Cervical back: Neck supple.  Lymphadenopathy:     Cervical: No cervical adenopathy.  Skin:    General: Skin is warm and dry.  Neurological:     Mental Status: She is  alert.     Deep Tendon Reflexes: Reflexes are normal and symmetric.     Wt Readings from Last 3 Encounters:  08/08/22 227 lb (103 kg)  08/05/22 233 lb (105.7 kg)  06/12/22 229 lb (103.9 kg)    BP 112/78   Pulse 84   Ht '5\' 2"'$  (1.575 m)   Wt 227 lb (103 kg)   SpO2 98%   BMI 41.52 kg/m   Assessment and Plan:  1. Acute cystitis without hematuria Chronic.  Controlled.  Stable.  Resolution of symptomatology without dysuria, frequency, urgency.  Examination with no CVA tenderness suprapubic tenderness.  Patient will continue the remainder of her medications and return on an as needed - POCT urinalysis dipstick    Otilio Miu, MD

## 2022-08-13 ENCOUNTER — Telehealth: Payer: Self-pay

## 2022-08-13 NOTE — Telephone Encounter (Signed)
        Patient  visited [place] on 2/27    Telephone encounter attempt :  1st  A HIPAA compliant voice message was left requesting a return call.  Instructed patient to call back .    [facility] [phone] [address], [place], [place] [address] Phone: [phone] Email: [email or web address]

## 2022-08-14 ENCOUNTER — Telehealth: Payer: Self-pay

## 2022-08-14 NOTE — Telephone Encounter (Signed)
        Patient  visited [place] on 2/27     Telephone encounter attempt :  2nd  A HIPAA compliant voice message was left requesting a return call.  Instructed patient to call back .    [facility] [phone] [address], [place], [place] [address] Phone: [phone] Email: [email or web address]

## 2022-08-26 ENCOUNTER — Other Ambulatory Visit: Payer: Self-pay | Admitting: Family Medicine

## 2022-08-26 DIAGNOSIS — E7801 Familial hypercholesterolemia: Secondary | ICD-10-CM

## 2022-08-26 NOTE — Telephone Encounter (Signed)
Medication Refill - Medication: Atorvastatin 10mg    Has the patient contacted their pharmacy? No.  She said there were no refills (Agent: If no, request that the patient contact the pharmacy for the refill. If patient does not wish to contact the pharmacy document the reason why and proceed with request.) (Agent: If yes, when and what did the pharmacy advise?)  Preferred Pharmacy (with phone number or street name): [facility] Has the patient been seen for an appointment in the last year OR does the patient have an upcoming appointment? Yes.    Agent: Please be advised that RX refills may take up to 3 business days. We ask that you follow-up with your pharmacy.

## 2022-08-27 MED ORDER — ATORVASTATIN CALCIUM 10 MG PO TABS: 10.0000 mg | ORAL_TABLET | Freq: Every day | ORAL | 0 refills | Status: DC

## 2022-08-27 NOTE — Telephone Encounter (Signed)
Requested Prescriptions  Pending Prescriptions Disp Refills   atorvastatin (LIPITOR) 10 MG tablet [Pharmacy Med Name: ATORVASTATIN 10 MG TABLET] 90 tablet 0    Sig: TAKE 1 TABLET BY MOUTH EVERY DAY     Cardiovascular:  Antilipid - Statins Failed - 08/26/2022  8:37 AM      Failed - Lipid Panel in normal range within the last 12 months    Cholesterol, Total  Date Value Ref Range Status  06/03/2022 202 (H) 100 - 199 mg/dL Final   Cholesterol  Date Value Ref Range Status  07/03/2014 196 0 - 200 mg/dL Final   Ldl Cholesterol, Calc  Date Value Ref Range Status  07/03/2014 125 (H) 0 - 100 mg/dL Final   LDL Chol Calc (NIH)  Date Value Ref Range Status  06/03/2022 133 (H) 0 - 99 mg/dL Final   HDL Cholesterol  Date Value Ref Range Status  07/03/2014 53 40 - 60 mg/dL Final   HDL  Date Value Ref Range Status  06/03/2022 49 >39 mg/dL Final   Triglycerides  Date Value Ref Range Status  06/03/2022 109 0 - 149 mg/dL Final  07/03/2014 90 0 - 200 mg/dL Final         Passed - Patient is not pregnant      Passed - Valid encounter within last 12 months    Recent Outpatient Visits           2 weeks ago Acute cystitis without hematuria   [facility] [place] [facility] [place], Deanna C, MD   2 months ago Acute COVID-19   [facility] at [place], Deanna C, MD   2 months ago Essential (primary) hypertension   [place] [facility] at [place], Deanna C, MD   5 months ago Acute maxillary sinusitis, recurrence not specified   [place] [facility] [place], [place], MD   7 months ago Fissure in [place] at [place], [place], MD       Future Appointments             In 3 months Juline Patch, MD [place] at [facility], [facility]

## 2022-08-27 NOTE — Telephone Encounter (Signed)
Requested Prescriptions  Pending Prescriptions Disp Refills   atorvastatin (LIPITOR) 10 MG tablet 90 tablet 0    Sig: Take 1 tablet (10 mg total) by mouth daily.     Cardiovascular:  Antilipid - Statins Failed - 08/26/2022  8:58 AM      Failed - Lipid Panel in normal range within the last 12 months    Cholesterol, Total  Date Value Ref Range Status  06/03/2022 202 (H) 100 - 199 mg/dL Final   Cholesterol  Date Value Ref Range Status  07/03/2014 196 0 - 200 mg/dL Final   Ldl Cholesterol, Calc  Date Value Ref Range Status  07/03/2014 125 (H) 0 - 100 mg/dL Final   LDL Chol Calc (NIH)  Date Value Ref Range Status  06/03/2022 133 (H) 0 - 99 mg/dL Final   HDL Cholesterol  Date Value Ref Range Status  07/03/2014 53 40 - 60 mg/dL Final   HDL  Date Value Ref Range Status  06/03/2022 49 >39 mg/dL Final   Triglycerides  Date Value Ref Range Status  06/03/2022 109 0 - 149 mg/dL Final  07/03/2014 90 0 - 200 mg/dL Final         Passed - Patient is not pregnant      Passed - Valid encounter within last 12 months    Recent Outpatient Visits           2 weeks ago Acute cystitis without hematuria   [facility] [place], Deanna C, MD   2 months ago Acute COVID-19   [facility] at [place], Deanna C, MD   2 months ago Essential (primary) hypertension   [place] [facility] at [place], Deanna C, MD   5 months ago Acute maxillary sinusitis, recurrence not specified   [place] at [place], [place], MD   7 months ago Fissure in [place] at [place], [place], MD       Future Appointments             In 3 months Juline Patch, MD [place] at [facility], [facility]

## 2022-09-11 ENCOUNTER — Ambulatory Visit (INDEPENDENT_AMBULATORY_CARE_PROVIDER_SITE_OTHER): Payer: Medicare HMO

## 2022-09-11 VITALS — Ht 62.0 in | Wt 227.0 lb

## 2022-09-11 DIAGNOSIS — Z1231 Encounter for screening mammogram for malignant neoplasm of breast: Secondary | ICD-10-CM

## 2022-09-11 DIAGNOSIS — Z96652 Presence of left artificial knee joint: Secondary | ICD-10-CM | POA: Diagnosis not present

## 2022-09-11 DIAGNOSIS — Z0000 Encounter for general adult medical examination without abnormal findings: Secondary | ICD-10-CM | POA: Diagnosis not present

## 2022-09-11 DIAGNOSIS — S86899A Other injury of other muscle(s) and tendon(s) at lower leg level, unspecified leg, initial encounter: Secondary | ICD-10-CM | POA: Diagnosis not present

## 2022-09-11 DIAGNOSIS — Z6841 Body Mass Index (BMI) 40.0 and over, adult: Secondary | ICD-10-CM | POA: Diagnosis not present

## 2022-09-11 NOTE — Patient Instructions (Signed)
Shannon Blake , Thank you for taking time to come for your Medicare Wellness Visit. I appreciate your ongoing commitment to your health goals. Please review the following plan we discussed and let me know if I can assist you in the future.   These are the goals we discussed:  Goals      DIET - EAT MORE FRUITS AND VEGETABLES     Increase physical activity     Recommend increasing physical activity to at least 3 days per week         This is a list of the screening recommended for you and due dates:  Health Maintenance  Topic Date Due   Zoster (Shingles) Vaccine (1 of 2) Never done   COVID-19 Vaccine (5 - 2023-24 season) 02/07/2022   Flu Shot  01/08/2023   Medicare Annual Wellness Visit  09/11/2023   Mammogram  10/31/2023   DTaP/Tdap/Td vaccine (3 - Td or Tdap) 07/02/2026   Colon Cancer Screening  08/12/2027   Pneumonia Vaccine  Completed   DEXA scan (bone density measurement)  Completed   HPV Vaccine  Aged Out   Hepatitis C Screening: USPSTF Recommendation to screen - Ages 52-79 yo.  Discontinued    Advanced directives: no  Conditions/risks identified: none  Next appointment: Follow up in one year for your annual wellness visit 09/17/23 @ 8:15 am by phone   Preventive Care 65 Years and Older, Female Preventive care refers to lifestyle choices and visits with your health care provider that can promote health and wellness. What does preventive care include? A yearly physical exam. This is also called an annual well check. Dental exams once or twice a year. Routine eye exams. Ask your health care provider how often you should have your eyes checked. Personal lifestyle choices, including: Daily care of your teeth and gums. Regular physical activity. Eating a healthy diet. Avoiding tobacco and drug use. Limiting alcohol use. Practicing safe sex. Taking low-dose aspirin every day. Taking vitamin and mineral supplements as recommended by your health care provider. What happens  during an annual well check? The services and screenings done by your health care provider during your annual well check will depend on your age, overall health, lifestyle risk factors, and family history of disease. Counseling  Your health care provider may ask you questions about your: Alcohol use. Tobacco use. Drug use. Emotional well-being. Home and relationship well-being. Sexual activity. Eating habits. History of falls. Memory and ability to understand (cognition). Work and work Statistician. Reproductive health. Screening  You may have the following tests or measurements: Height, weight, and BMI. Blood pressure. Lipid and cholesterol levels. These may be checked every 5 years, or more frequently if you are over 35 years old. Skin check. Lung cancer screening. You may have this screening every year starting at age 74 if you have a 30-pack-year history of smoking and currently smoke or have quit within the past 15 years. Fecal occult blood test (FOBT) of the stool. You may have this test every year starting at age 40. Flexible sigmoidoscopy or colonoscopy. You may have a sigmoidoscopy every 5 years or a colonoscopy every 10 years starting at age 23. Hepatitis C blood test. Hepatitis B blood test. Sexually transmitted disease (STD) testing. Diabetes screening. This is done by checking your blood sugar (glucose) after you have not eaten for a while (fasting). You may have this done every 1-3 years. Bone density scan. This is done to screen for osteoporosis. You may have this done  starting at age 62. Mammogram. This may be done every 1-2 years. Talk to your health care provider about how often you should have regular mammograms. Talk with your health care provider about your test results, treatment options, and if necessary, the need for more tests. Vaccines  Your health care provider may recommend certain vaccines, such as: Influenza vaccine. This is recommended every  year. Tetanus, diphtheria, and acellular pertussis (Tdap, Td) vaccine. You may need a Td booster every 10 years. Zoster vaccine. You may need this after age 54. Pneumococcal 13-valent conjugate (PCV13) vaccine. One dose is recommended after age 102. Pneumococcal polysaccharide (PPSV23) vaccine. One dose is recommended after age 2. Talk to your health care provider about which screenings and vaccines you need and how often you need them. This information is not intended to replace advice given to you by your health care provider. Make sure you discuss any questions you have with your health care provider. Document Released: 06/22/2015 Document Revised: 02/13/2016 Document Reviewed: 03/27/2015 Elsevier Interactive Patient Education  2017 [place] Prevention in the Home Falls can cause injuries. They can happen to people of all ages. There are many things you can do to make your home safe and to help prevent falls. What can I do on the outside of my home? Regularly fix the edges of walkways and driveways and fix any cracks. Remove anything that might make you trip as you walk through a door, such as a raised step or threshold. Trim any bushes or trees on the path to your home. Use bright outdoor lighting. Clear any walking paths of anything that might make someone trip, such as rocks or tools. Regularly check to see if handrails are loose or broken. Make sure that both sides of any steps have handrails. Any raised decks and porches should have guardrails on the edges. Have any leaves, snow, or ice cleared regularly. Use sand or salt on walking paths during winter. Clean up any spills in your garage right away. This includes oil or grease spills. What can I do in the bathroom? Use night lights. Install grab bars by the toilet and in the tub and shower. Do not use towel bars as grab bars. Use non-skid mats or decals in the tub or shower. If you need to sit down in the shower, use a  plastic, non-slip stool. Keep the floor dry. Clean up any water that spills on the floor as soon as it happens. Remove soap buildup in the tub or shower regularly. Attach bath mats securely with double-sided non-slip rug tape. Do not have throw rugs and other things on the floor that can make you trip. What can I do in the bedroom? Use night lights. Make sure that you have a light by your bed that is easy to reach. Do not use any sheets or blankets that are too big for your bed. They should not hang down onto the floor. Have a firm chair that has side arms. You can use this for support while you get dressed. Do not have throw rugs and other things on the floor that can make you trip. What can I do in the kitchen? Clean up any spills right away. Avoid walking on wet floors. Keep items that you use a lot in easy-to-reach places. If you need to reach something above you, use a strong step stool that has a grab bar. Keep electrical cords out of the way. Do not use floor polish or wax that  makes floors slippery. If you must use wax, use non-skid floor wax. Do not have throw rugs and other things on the floor that can make you trip. What can I do with my stairs? Do not leave any items on the stairs. Make sure that there are handrails on both sides of the stairs and use them. Fix handrails that are broken or loose. Make sure that handrails are as long as the stairways. Check any carpeting to make sure that it is firmly attached to the stairs. Fix any carpet that is loose or worn. Avoid having throw rugs at the top or bottom of the stairs. If you do have throw rugs, attach them to the floor with carpet tape. Make sure that you have a light switch at the top of the stairs and the bottom of the stairs. If you do not have them, ask someone to add them for you. What else can I do to help prevent falls? Wear shoes that: Do not have high heels. Have rubber bottoms. Are comfortable and fit you  well. Are closed at the toe. Do not wear sandals. If you use a stepladder: Make sure that it is fully opened. Do not climb a closed stepladder. Make sure that both sides of the stepladder are locked into place. Ask someone to hold it for you, if possible. Clearly mark and make sure that you can see: Any grab bars or handrails. First and last steps. Where the edge of each step is. Use tools that help you move around (mobility aids) if they are needed. These include: Canes. Walkers. Scooters. Crutches. Turn on the lights when you go into a dark area. Replace any light bulbs as soon as they burn out. Set up your furniture so you have a clear path. Avoid moving your furniture around. If any of your floors are uneven, fix them. If there are any pets around you, be aware of where they are. Review your medicines with your doctor. Some medicines can make you feel dizzy. This can increase your chance of falling. Ask your doctor what other things that you can do to help prevent falls. This information is not intended to replace advice given to you by your health care provider. Make sure you discuss any questions you have with your health care provider. Document Released: 03/22/2009 Document Revised: 11/01/2015 Document Reviewed: 06/30/2014 Elsevier Interactive Patient Education  2017 [employer].

## 2022-09-11 NOTE — Progress Notes (Signed)
I connected with  Shannon Blake on 09/11/22 by a audio enabled telemedicine application and verified that I am speaking with the correct person using two identifiers.  Patient Location: Home  Provider Location: Office/Clinic  I discussed the limitations of evaluation and management by telemedicine. The patient expressed understanding and agreed to proceed.  Subjective:   Shannon Blake is a 67 y.o. female who presents for Medicare Annual (Subsequent) preventive examination.  Review of Systems     Cardiac Risk Factors include: advanced age (>30men, >75 women);hypertension;dyslipidemia;obesity (BMI >30kg/m2)     Objective:    There were no vitals filed for this visit. There is no height or weight on file to calculate BMI.     09/11/2022   10:00 AM 09/02/2021    8:35 AM 08/13/2021    5:56 PM 08/11/2021   10:45 AM 08/11/2017    7:39 AM 01/05/2017    2:48 PM 08/27/2015   12:36 PM  Advanced Directives  Does Patient Have a Medical Advance Directive? No No No No No No No  Would patient like information on creating a medical advance directive? No - Patient declined Yes (MAU/Ambulatory/Procedural Areas - Information given) No - Patient declined  No - Patient declined No - Patient declined     Current Medications (verified) Outpatient Encounter Medications as of 09/11/2022  Medication Sig   amLODipine-benazepril (LOTREL) 5-10 MG capsule TAKE 1 CAPSULE BY MOUTH EVERY DAY   aspirin 81 MG chewable tablet Chew 81 mg by mouth daily.    atorvastatin (LIPITOR) 10 MG tablet Take 1 tablet (10 mg total) by mouth daily.   Calcium Carb-Cholecalciferol (CALCIUM 600 + D PO) Take 1 tablet by mouth daily.   Multiple Vitamins-Minerals (CENTRUM SILVER ULTRA WOMENS) TABS Take 1 tablet by mouth daily.   ondansetron (ZOFRAN-ODT) 4 MG disintegrating tablet Take 1 tablet (4 mg total) by mouth every 8 (eight) hours as needed for nausea or vomiting. (Patient not taking: Reported on 09/11/2022)   No facility-administered  encounter medications on file as of 09/11/2022.    Allergies (verified) Patient has no known allergies.   History: Past Medical History:  Diagnosis Date   Arthritis    Family history of ovarian cancer    Pt's affected sister is MyRisk neg   Hypertension    Past Surgical History:  Procedure Laterality Date   CHOLECYSTECTOMY     COLONOSCOPY  2007   Dr Candace Cruise   COLONOSCOPY WITH PROPOFOL N/A 08/11/2017   Procedure: COLONOSCOPY WITH PROPOFOL;  Surgeon: Lucilla Lame, MD;  Location: [facility] ENDOSCOPY;  Service: Endoscopy;  Laterality: N/A;   GALLBLADDER SURGERY     KNEE ARTHROPLASTY Left 08/27/2015   Procedure: COMPUTER ASSISTED TOTAL KNEE ARTHROPLASTY;  Surgeon: Dereck Leep, MD;  Location: [facility] ORS;  Service: Orthopedics;  Laterality: Left;   meniscal surgery Left    Family History  Problem Relation Age of Onset   Hypertension Mother    Hypertension Father    Cancer Sister 60       peritoneal/ovarian cancer; MyRisk neg at [facility] oncology 6/16   Breast cancer Maternal Aunt 88   Thyroid cancer Maternal Aunt        gland   Social History   Socioeconomic History   Marital status: Single    Spouse name: Not on file   Number of children: 1   Years of education: Not on file   Highest education level: Not on file  Occupational History   Occupation: Medical Tech  Tobacco Use  Smoking status: Never   Smokeless tobacco: Never  Vaping Use   Vaping Use: Never used  Substance and Sexual Activity   Alcohol use: No    Alcohol/week: 0.0 standard drinks of alcohol   Drug use: No   Sexual activity: Yes    Birth control/protection: Post-menopausal  Other Topics Concern   Not on file  Social History Narrative   Pt lives with Raynelle Dick - significant other   Social Determinants of Health   Financial Resource Strain: Low Risk  (09/11/2022)   Overall Financial Resource Strain (CARDIA)    Difficulty of Paying Living Expenses: Not hard at all  Food Insecurity: No Food Insecurity (09/11/2022)    Hunger Vital Sign    Worried About Running Out of Food in the Last Year: Never true    Ran Out of Food in the Last Year: Never true  Transportation Needs: No Transportation Needs (09/11/2022)   PRAPARE - Hydrologist (Medical): No    Lack of Transportation (Non-Medical): No  Physical Activity: Insufficiently Active (09/11/2022)   Exercise Vital Sign    Days of Exercise per Week: 2 days    Minutes of Exercise per Session: 20 min  Stress: No Stress Concern Present (09/11/2022)   [place]    Feeling of Stress : Not at all  Social Connections: Moderately Isolated (09/11/2022)   Social Connection and Isolation Panel [NHANES]    Frequency of Communication with Friends and Family: Twice a week    Frequency of Social Gatherings with Friends and Family: Once a week    Attends Religious Services: Never    Marine scientist or Organizations: No    Attends Music therapist: Never    Marital Status: Living with partner    Tobacco Counseling Counseling given: Not Answered   Clinical Intake:  Pre-visit preparation completed: Yes  Pain : No/denies pain     Nutritional Risks: None Diabetes: No     Diabetic?no  Interpreter Needed?: No  Information entered by :: Kirke Shaggy, LPN   Activities of Daily Living    09/11/2022   10:01 AM  In your present state of health, do you have any difficulty performing the following activities:  Hearing? 0  Vision? 0  Difficulty concentrating or making decisions? 0  Walking or climbing stairs? 1  Comment knee  Dressing or bathing? 0  Doing errands, shopping? 0  Preparing Food and eating ? N  Using the Toilet? N  In the past six months, have you accidently leaked urine? N  Do you have problems with loss of bowel control? N  Managing your Medications? N  Managing your Finances? N  Housekeeping or managing your Housekeeping? N     Patient Care Team: Juline Patch, MD as PCP - General (Family Medicine) Copland, Ginette Otto as Referring Physician (Obstetrics and Gynecology) Marry Guan Laurice Record, MD (Orthopedic Surgery)  Indicate any recent Medical Services you may have received from other than [facility] providers in the past year (date may be approximate).     Assessment:   This is a routine wellness examination for Deeann.  Hearing/Vision screen Hearing Screening - Comments:: No aids Vision Screening - Comments:: Readers- Dr.Dingeldein  Dietary issues and exercise activities discussed: Current Exercise Habits: Home exercise routine, Type of exercise: walking, Time (Minutes): 20, Frequency (Times/Week): 2, Weekly Exercise (Minutes/Week): 40, Intensity: Mild   Goals Addressed  This Visit's Progress    DIET - EAT MORE FRUITS AND VEGETABLES         Depression Screen    09/11/2022    9:59 AM 08/08/2022    1:56 PM 06/12/2022   11:26 AM 06/03/2022    8:38 AM 03/24/2022    3:53 PM 12/02/2021    8:24 AM 09/02/2021    8:34 AM  PHQ 2/9 Scores  PHQ - 2 Score 0 0 0 0 0 0 0  PHQ- 9 Score 0 3 0 0 0 0     Fall Risk    09/11/2022   10:01 AM 08/08/2022    1:56 PM 06/12/2022   11:26 AM 06/03/2022    8:38 AM 03/24/2022    3:53 PM  Fall Risk   Falls in the past year? 0 0 0 0 0  Number falls in past yr: 0 0 0 0 0  Injury with Fall? 0 0 0 0 0  Risk for fall due to : No Fall Risks No Fall Risks No Fall Risks No Fall Risks No Fall Risks  Follow up Falls prevention discussed;Falls evaluation completed Falls evaluation completed Falls evaluation completed Falls evaluation completed Falls evaluation completed    FALL RISK PREVENTION PERTAINING TO THE HOME:  Any stairs in or around the home? No  If so, are there any without handrails? No  Home free of loose throw rugs in walkways, pet beds, electrical cords, etc? Yes  Adequate lighting in your home to reduce risk of falls? Yes   ASSISTIVE DEVICES UTILIZED TO  PREVENT FALLS:  Life alert? No  Use of a cane, walker or w/c? No  Grab bars in the bathroom? Yes  Shower chair or bench in shower? Yes  Elevated toilet seat or a handicapped toilet? Yes   Cognitive Function:        09/11/2022   10:07 AM  6CIT Screen  What Year? 0 points  What month? 0 points  What time? 3 points  Count back from 20 0 points  Months in reverse 0 points  Repeat phrase 2 points  Total Score 5 points    Immunizations Immunization History  Administered Date(s) Administered   Fluad Quad(high Dose 65+) 06/21/2020, 06/03/2022   Influenza-Unspecified 05/04/2021   Moderna Sars-Covid-2 Vaccination 06/23/2019, 07/21/2019, 04/19/2020, 03/18/2021   PNEUMOCOCCAL CONJUGATE-20 06/11/2021   Tdap 06/09/2005, 07/02/2016    TDAP status: Up to date  Flu Vaccine status: Up to date  Pneumococcal vaccine status: Up to date  Covid-19 vaccine status: Completed vaccines  Qualifies for Shingles Vaccine? Yes   Zostavax completed No   Shingrix Completed?: No.    Education has been provided regarding the importance of this vaccine. Patient has been advised to call insurance company to determine out of pocket expense if they have not yet received this vaccine. Advised may also receive vaccine at local pharmacy or Health Dept. Verbalized acceptance and understanding.  Screening Tests Health Maintenance  Topic Date Due   Zoster Vaccines- Shingrix (1 of 2) Never done   COVID-19 Vaccine (5 - 2023-24 season) 02/07/2022   INFLUENZA VACCINE  01/08/2023   Medicare Annual Wellness (AWV)  09/11/2023   MAMMOGRAM  10/31/2023   DTaP/Tdap/Td (3 - Td or Tdap) 07/02/2026   COLONOSCOPY (Pts 45-12yrs Insurance coverage will need to be confirmed)  08/12/2027   Pneumonia Vaccine 69+ Years old  Completed   DEXA SCAN  Completed   HPV VACCINES  Aged Out   Hepatitis C Screening  Discontinued  Health Maintenance  Health Maintenance Due  Topic Date Due   Zoster Vaccines- Shingrix (1 of 2)  Never done   COVID-19 Vaccine (5 - 2023-24 season) 02/07/2022    Colorectal cancer screening: Type of screening: Colonoscopy. Completed 08/11/17. Repeat every 10 years  Mammogram status: Completed 10/30/21. Repeat every year- will make appointment  Bone Density status: Completed 10/29/20. Results reflect: Bone density results: NORMAL. Repeat every 5 years.  Lung Cancer Screening: (Low Dose CT Chest recommended if Age 64-80 years, 30 pack-year currently smoking OR have quit w/in 15years.) does not qualify.   Additional Screening:  Hepatitis C Screening: does qualify; Completed no  Vision Screening: Recommended annual ophthalmology exams for early detection of glaucoma and other disorders of the eye. Is the patient up to date with their annual eye exam?  Yes  Who is the provider or what is the name of the office in which the patient attends annual eye exams? Dr.Dingeldein If pt is not established with a provider, would they like to be referred to a provider to establish care? No .   Dental Screening: Recommended annual dental exams for proper oral hygiene  Community Resource Referral / Chronic Care Management: CRR required this visit?  No   CCM required this visit?  No      Plan:     I have personally reviewed and noted the following in the patient's chart:   Medical and social history Use of alcohol, tobacco or illicit drugs  Current medications and supplements including opioid prescriptions. Patient is not currently taking opioid prescriptions. Functional ability and status Nutritional status Physical activity Advanced directives List of other physicians Hospitalizations, surgeries, and ER visits in previous 12 months Vitals Screenings to include cognitive, depression, and falls Referrals and appointments  In addition, I have reviewed and discussed with patient certain preventive protocols, quality metrics, and best practice recommendations. A written personalized care  plan for preventive services as well as general preventive health recommendations were provided to patient.     Dionisio David, LPN   [id number]   Nurse Notes: none

## 2022-10-07 NOTE — Progress Notes (Signed)
Viral illness concerning for influenza or covid

## 2022-11-04 ENCOUNTER — Ambulatory Visit
Admission: RE | Admit: 2022-11-04 | Discharge: 2022-11-04 | Disposition: A | Discharge status: Home / Self Care | Payer: Medicare HMO | Source: Ambulatory Visit | Attending: Family Medicine | Admitting: Family Medicine

## 2022-11-04 DIAGNOSIS — Z1231 Encounter for screening mammogram for malignant neoplasm of breast: Secondary | ICD-10-CM | POA: Diagnosis not present

## 2022-11-22 ENCOUNTER — Other Ambulatory Visit: Payer: Self-pay | Admitting: Family Medicine

## 2022-11-22 DIAGNOSIS — E7801 Familial hypercholesterolemia: Secondary | ICD-10-CM

## 2022-11-24 NOTE — Telephone Encounter (Signed)
Requested Prescriptions  Pending Prescriptions Disp Refills   atorvastatin (LIPITOR) 10 MG tablet [Pharmacy Med Name: ATORVASTATIN 10 MG TABLET] 90 tablet 1    Sig: TAKE 1 TABLET BY MOUTH EVERY DAY     Cardiovascular:  Antilipid - Statins Failed - 11/22/2022  8:50 AM      Failed - Lipid Panel in normal range within the last 12 months    Cholesterol, Total  Date Value Ref Range Status  06/03/2022 202 (H) 100 - 199 mg/dL Final   Cholesterol  Date Value Ref Range Status  07/03/2014 196 0 - 200 mg/dL Final   Ldl Cholesterol, Calc  Date Value Ref Range Status  07/03/2014 125 (H) 0 - 100 mg/dL Final   LDL Chol Calc (NIH)  Date Value Ref Range Status  06/03/2022 133 (H) 0 - 99 mg/dL Final   HDL Cholesterol  Date Value Ref Range Status  07/03/2014 53 40 - 60 mg/dL Final   HDL  Date Value Ref Range Status  06/03/2022 49 >39 mg/dL Final   Triglycerides  Date Value Ref Range Status  06/03/2022 109 0 - 149 mg/dL Final  16/03/9603 90 0 - 200 mg/dL Final         Passed - Patient is not pregnant      Passed - Valid encounter within last 12 months    Recent Outpatient Visits           3 months ago Acute cystitis without hematuria   [place] [facility]   5 months ago Acute COVID-19   [facility]   5 months ago Essential (primary) hypertension    [facility] at [facility]   8 months ago Acute maxillary sinusitis, recurrence not specified   [facility]   10 months ago Fissure in ano   [facility]       Future Appointments             In 1 week Duanne Limerick, MD [facility] at [facility], [facility]

## 2022-12-03 ENCOUNTER — Ambulatory Visit: Payer: Medicare HMO | Admitting: Family Medicine

## 2022-12-05 ENCOUNTER — Encounter: Payer: Self-pay | Admitting: Family Medicine

## 2022-12-05 ENCOUNTER — Ambulatory Visit (INDEPENDENT_AMBULATORY_CARE_PROVIDER_SITE_OTHER): Payer: Medicare HMO | Admitting: Family Medicine

## 2022-12-05 VITALS — BP 120/78 | HR 64 | Ht 62.0 in | Wt 230.0 lb

## 2022-12-05 DIAGNOSIS — I10 Essential (primary) hypertension: Secondary | ICD-10-CM

## 2022-12-05 DIAGNOSIS — E7801 Familial hypercholesterolemia: Secondary | ICD-10-CM | POA: Diagnosis not present

## 2022-12-05 MED ORDER — ATORVASTATIN CALCIUM 10 MG PO TABS: 10.0000 mg | ORAL_TABLET | Freq: Every day | ORAL | 1 refills | Status: DC

## 2022-12-05 MED ORDER — AMLODIPINE BESY-BENAZEPRIL HCL 5-10 MG PO CAPS: ORAL_CAPSULE | ORAL | 1 refills | Status: DC

## 2022-12-05 NOTE — Progress Notes (Signed)
Date:  12/05/2022   Name:  Shannon Blake   DOB:  28-Aug-1955   MRN:  [id number]   Chief Complaint: Hypertension and Hyperlipidemia  Hypertension This is a chronic problem. The current episode started more than 1 year ago. The problem has been gradually improving since onset. The problem is controlled. Pertinent negatives include no blurred vision, chest pain, headaches, orthopnea, palpitations, peripheral edema, PND or shortness of breath. There are no associated agents to hypertension. There are no known risk factors for coronary artery disease. Past treatments include ACE inhibitors and calcium channel blockers. The current treatment provides moderate improvement. There are no compliance problems.  There is no history of kidney disease, CAD/MI, CVA or heart failure. There is no history of chronic renal disease, a hypertension causing med or renovascular disease.  Hyperlipidemia The current episode started more than 1 year ago. The problem is controlled. Recent lipid tests were reviewed and are normal. She has no history of chronic renal disease. Pertinent negatives include no chest pain or shortness of breath. Current antihyperlipidemic treatment includes statins. The current treatment provides moderate improvement of lipids. There are no compliance problems.     Lab Results  Component Value Date   NA 139 08/05/2022   K 4.0 08/05/2022   CO2 24 08/05/2022   GLUCOSE 128 (H) 08/05/2022   BUN 23 08/05/2022   CREATININE 1.05 (H) 08/05/2022   CALCIUM 9.6 08/05/2022   EGFR 96 06/03/2022   GFRNONAA 58 (L) 08/05/2022   Lab Results  Component Value Date   CHOL 202 (H) 06/03/2022   HDL 49 06/03/2022   LDLCALC 133 (H) 06/03/2022   TRIG 109 06/03/2022   CHOLHDL 3.7 01/03/2019   Lab Results  Component Value Date   TSH 2.30 12/28/2013   Lab Results  Component Value Date   HGBA1C 5.8 12/28/2013   Lab Results  Component Value Date   WBC 14.3 (H) 08/05/2022   HGB 13.0 08/05/2022   HCT  40.3 08/05/2022   MCV 82.9 08/05/2022   PLT 366 08/05/2022   Lab Results  Component Value Date   ALT 16 08/05/2022   AST 20 08/05/2022   ALKPHOS 82 08/05/2022   BILITOT 0.5 08/05/2022   No results found for: "25OHVITD2", "25OHVITD3", "VD25OH"   Review of Systems  HENT:  Negative for trouble swallowing.   Eyes:  Negative for blurred vision and visual disturbance.  Respiratory:  Negative for chest tightness, shortness of breath and wheezing.   Cardiovascular:  Negative for chest pain, palpitations, orthopnea and PND.  Gastrointestinal:  Negative for abdominal pain and blood in stool.  Endocrine: Negative for polydipsia and polyuria.  Genitourinary:  Negative for difficulty urinating.  Neurological:  Negative for headaches.    Patient Active Problem List   Diagnosis Date Noted   BMI 40.0-44.9, adult (HCC) 06/27/2019   Familial hypercholesterolemia 06/27/2019   Special screening for malignant neoplasms, colon    Polyp of sigmoid colon    Family history of peritoneal cancer 07/20/2017   Status post total left knee replacement 08/27/2015   Calculus of kidney 10/17/2014   Alimentary obesity 10/17/2014   Cough 10/17/2014   Essential (primary) hypertension 10/17/2014   Combined fat and carbohydrate induced hyperlipemia 10/17/2014   Derangement of knee 12/01/2013   Arthritis of knee, degenerative 12/01/2013    No Known Allergies  Past Surgical History:  Procedure Laterality Date   CHOLECYSTECTOMY     COLONOSCOPY  2007   Dr Bluford Kaufmann   COLONOSCOPY WITH  PROPOFOL N/A 08/11/2017   Procedure: COLONOSCOPY WITH PROPOFOL;  Surgeon: Midge Minium, MD;  Location: [facility] ENDOSCOPY;  Service: Endoscopy;  Laterality: N/A;   GALLBLADDER SURGERY     KNEE ARTHROPLASTY Left 08/27/2015   Procedure: COMPUTER ASSISTED TOTAL KNEE ARTHROPLASTY;  Surgeon: Donato Heinz, MD;  Location: [facility] ORS;  Service: Orthopedics;  Laterality: Left;   meniscal surgery Left     Social History   Tobacco Use   Smoking  status: Never   Smokeless tobacco: Never  Vaping Use   Vaping Use: Never used  Substance Use Topics   Alcohol use: No    Alcohol/week: 0.0 standard drinks of alcohol   Drug use: No     Medication list has been reviewed and updated.  Current Meds  Medication Sig   amLODipine-benazepril (LOTREL) 5-10 MG capsule TAKE 1 CAPSULE BY MOUTH EVERY DAY   aspirin 81 MG chewable tablet Chew 81 mg by mouth daily.    atorvastatin (LIPITOR) 10 MG tablet TAKE 1 TABLET BY MOUTH EVERY DAY   Calcium Carb-Cholecalciferol (CALCIUM 600 + D PO) Take 1 tablet by mouth daily.   Multiple Vitamins-Minerals (CENTRUM SILVER ULTRA WOMENS) TABS Take 1 tablet by mouth daily.       12/05/2022    8:22 AM 08/08/2022    1:56 PM 06/12/2022   11:26 AM 06/03/2022    8:38 AM  GAD 7 : Generalized Anxiety Score  Nervous, Anxious, on Edge 0 0 0 0  Control/stop worrying 0 0 0 0  Worry too much - different things 0 0 0 0  Trouble relaxing 0 0 0 0  Restless 0 0 0 0  Easily annoyed or irritable 0 0 0 0  Afraid - awful might happen 0 0 0 0  Total GAD 7 Score 0 0 0 0  Anxiety Difficulty Not difficult at all Not difficult at all Not difficult at all Not difficult at all       12/05/2022    8:22 AM 09/11/2022    9:59 AM 08/08/2022    1:56 PM  Depression screen PHQ 2/9  Decreased Interest 0 0 0  Down, Depressed, Hopeless 0 0 0  PHQ - 2 Score 0 0 0  Altered sleeping 0 0 0  Tired, decreased energy 0 0 3  Change in appetite 0 0 0  Feeling bad or failure about yourself  0 0 0  Trouble concentrating 0 0 0  Moving slowly or fidgety/restless 0 0 0  Suicidal thoughts 0 0 0  PHQ-9 Score 0 0 3  Difficult doing work/chores Not difficult at all Not difficult at all Not difficult at all    BP Readings from Last 3 Encounters:  12/05/22 120/78  08/08/22 112/78  08/05/22 134/70    Physical Exam Constitutional:      Appearance: Normal appearance. She is well-developed.  HENT:     Head: Normocephalic.     Right Ear: Tympanic  membrane and external ear normal.     Left Ear: Tympanic membrane and external ear normal.     Nose: Nose normal.     Mouth/Throat:     Mouth: Mucous membranes are moist.  Eyes:     General: Lids are everted, no foreign bodies appreciated. No scleral icterus.       Left eye: No foreign body or hordeolum.     Conjunctiva/sclera: Conjunctivae normal.     Right eye: Right conjunctiva is not injected.     Left eye: Left conjunctiva is not  injected.     Pupils: Pupils are equal, round, and reactive to light.  Neck:     Thyroid: No thyromegaly.     Vascular: No JVD.     Trachea: No tracheal deviation.  Cardiovascular:     Rate and Rhythm: Normal rate and regular rhythm.     Heart sounds: Normal heart sounds. No murmur heard.    No friction rub. No gallop.  Pulmonary:     Effort: Pulmonary effort is normal. No respiratory distress.     Breath sounds: Normal breath sounds. No wheezing, rhonchi or rales.  Abdominal:     General: Bowel sounds are normal.     Palpations: Abdomen is soft. There is no mass.     Tenderness: There is no abdominal tenderness. There is no guarding or rebound.  Musculoskeletal:        General: No tenderness. Normal range of motion.     Cervical back: Normal range of motion and neck supple.  Lymphadenopathy:     Cervical: No cervical adenopathy.  Skin:    General: Skin is warm.     Findings: No bruising or rash.  Neurological:     Mental Status: She is alert and oriented to person, place, and time.     Cranial Nerves: No cranial nerve deficit.     Deep Tendon Reflexes: Reflexes normal.  Psychiatric:        Mood and Affect: Mood is not anxious or depressed.     Wt Readings from Last 3 Encounters:  12/05/22 230 lb (104.3 kg)  09/11/22 227 lb (103 kg)  08/08/22 227 lb (103 kg)    BP 120/78   Pulse 64   Ht 5\' 2"  (1.575 m)   Wt 230 lb (104.3 kg)   SpO2 98%   BMI 42.07 kg/m   Assessment and Plan: 1. Essential (primary) hypertension Chronic.   Controlled.  Stable.  Blood pressure 120/78.  Asymptomatic.  Tolerating medications well.  Continue amlodipine benazepril 5-10 mg once a day.  Review of labs from earlier in the year is acceptable.  Will recheck patient in 6 months. - amLODipine-benazepril (LOTREL) 5-10 MG capsule; TAKE 1 CAPSULE BY MOUTH EVERY DAY  Dispense: 90 capsule; Refill: 1  2. Familial hypercholesterolemia Chronic.  Elevated LDL has visit 6 months ago.  Stable.  Currently on atorvastatin 10 mg once a day.  Patient is fasting and we will check lipid panel with the possibility of increasing atorvastatin if remains elevated.  Will recheck in 6 months. - atorvastatin (LIPITOR) 10 MG tablet; Take 1 tablet (10 mg total) by mouth daily.  Dispense: 90 tablet; Refill: 1 - Lipid Panel With LDL/HDL Ratio     Elizabeth Sauer, MD

## 2022-12-06 LAB — LIPID PANEL WITH LDL/HDL RATIO
Cholesterol, Total: 166 mg/dL (ref 100–199)
HDL: 53 mg/dL (ref >39)
LDL Chol Calc (NIH): 99 mg/dL (ref 0–99)
LDL/HDL Ratio: 1.9 ratio (ref 0.0–3.2)
Triglycerides: 72 mg/dL (ref 0–149)
VLDL Cholesterol Cal: 14 mg/dL (ref 5–40)

## 2022-12-16 ENCOUNTER — Encounter: Payer: Self-pay | Admitting: Family Medicine

## 2022-12-16 ENCOUNTER — Ambulatory Visit (INDEPENDENT_AMBULATORY_CARE_PROVIDER_SITE_OTHER): Payer: Medicare HMO | Admitting: Family Medicine

## 2022-12-16 VITALS — BP 120/70 | HR 64 | Ht 62.0 in | Wt 230.0 lb

## 2022-12-16 DIAGNOSIS — R051 Acute cough: Secondary | ICD-10-CM | POA: Diagnosis not present

## 2022-12-16 DIAGNOSIS — J0100 Acute maxillary sinusitis, unspecified: Secondary | ICD-10-CM

## 2022-12-16 LAB — POC COVID19 BINAXNOW: SARS Coronavirus 2 Ag: NEGATIVE

## 2022-12-16 MED ORDER — AMOXICILLIN 500 MG PO CAPS: 500.0000 mg | ORAL_CAPSULE | Freq: Three times a day (TID) | ORAL | 0 refills | Status: AC

## 2022-12-16 MED ORDER — PROMETHAZINE-DM 6.25-15 MG/5ML PO SYRP: 5.0000 mL | ORAL_SOLUTION | Freq: Four times a day (QID) | ORAL | 0 refills | Status: DC | PRN

## 2022-12-16 NOTE — Progress Notes (Signed)
Date:  12/16/2022   Name:  Shannon Blake   DOB:  09-May-1956   MRN:  [id number]   Chief Complaint: Cough (Sore throat- yellow phlegm, R) earache)  Cough This is a chronic problem. The current episode started more than 1 year ago. The problem has been gradually improving. The cough is Non-productive. Pertinent negatives include no chest pain, chills, ear congestion, ear pain, fever, headaches, heartburn, hemoptysis, myalgias, nasal congestion, postnasal drip, rash, rhinorrhea, sore throat, shortness of breath, sweats, weight loss or wheezing. She has tried a beta-agonist inhaler for the symptoms. The treatment provided moderate relief.  Sinusitis This is a chronic problem. The current episode started more than 1 year ago. The problem has been gradually improving since onset. Associated symptoms include congestion and coughing. Pertinent negatives include no chills, ear pain, headaches, hoarse voice, shortness of breath, sinus pressure, sneezing or sore throat. The treatment provided mild relief.    Lab Results  Component Value Date   NA 139 08/05/2022   K 4.0 08/05/2022   CO2 24 08/05/2022   GLUCOSE 128 (H) 08/05/2022   BUN 23 08/05/2022   CREATININE 1.05 (H) 08/05/2022   CALCIUM 9.6 08/05/2022   EGFR 96 06/03/2022   GFRNONAA 58 (L) 08/05/2022   Lab Results  Component Value Date   CHOL 166 12/05/2022   HDL 53 12/05/2022   LDLCALC 99 12/05/2022   TRIG 72 12/05/2022   CHOLHDL 3.7 01/03/2019   Lab Results  Component Value Date   TSH 2.30 12/28/2013   Lab Results  Component Value Date   HGBA1C 5.8 12/28/2013   Lab Results  Component Value Date   WBC 14.3 (H) 08/05/2022   HGB 13.0 08/05/2022   HCT 40.3 08/05/2022   MCV 82.9 08/05/2022   PLT 366 08/05/2022   Lab Results  Component Value Date   ALT 16 08/05/2022   AST 20 08/05/2022   ALKPHOS 82 08/05/2022   BILITOT 0.5 08/05/2022   No results found for: "25OHVITD2", "25OHVITD3", "VD25OH"   Review of Systems   Constitutional:  Negative for chills, fever and weight loss.  HENT:  Positive for congestion. Negative for ear pain, hoarse voice, postnasal drip, rhinorrhea, sinus pressure, sneezing and sore throat.   Respiratory:  Positive for cough. Negative for hemoptysis, shortness of breath and wheezing.   Cardiovascular:  Negative for chest pain.  Gastrointestinal:  Negative for heartburn.  Musculoskeletal:  Negative for myalgias.  Skin:  Negative for rash.  Neurological:  Negative for headaches.    Patient Active Problem List   Diagnosis Date Noted   BMI 40.0-44.9, adult (HCC) 06/27/2019   Familial hypercholesterolemia 06/27/2019   Special screening for malignant neoplasms, colon    Polyp of sigmoid colon    Family history of peritoneal cancer 07/20/2017   Status post total left knee replacement 08/27/2015   Calculus of kidney 10/17/2014   Alimentary obesity 10/17/2014   Cough 10/17/2014   Essential (primary) hypertension 10/17/2014   Combined fat and carbohydrate induced hyperlipemia 10/17/2014   Derangement of knee 12/01/2013   Arthritis of knee, degenerative 12/01/2013    No Known Allergies  Past Surgical History:  Procedure Laterality Date   CHOLECYSTECTOMY     COLONOSCOPY  2007   Dr Bluford Kaufmann   COLONOSCOPY WITH PROPOFOL N/A 08/11/2017   Procedure: COLONOSCOPY WITH PROPOFOL;  Surgeon: Midge Minium, MD;  Location: [facility] ENDOSCOPY;  Service: Endoscopy;  Laterality: N/A;   GALLBLADDER SURGERY     KNEE ARTHROPLASTY Left 08/27/2015  Procedure: COMPUTER ASSISTED TOTAL KNEE ARTHROPLASTY;  Surgeon: Donato Heinz, MD;  Location: [facility] ORS;  Service: Orthopedics;  Laterality: Left;   meniscal surgery Left     Social History   Tobacco Use   Smoking status: Never   Smokeless tobacco: Never  Vaping Use   Vaping Use: Never used  Substance Use Topics   Alcohol use: No    Alcohol/week: 0.0 standard drinks of alcohol   Drug use: No     Medication list has been reviewed and  updated.  Current Meds  Medication Sig   amLODipine-benazepril (LOTREL) 5-10 MG capsule TAKE 1 CAPSULE BY MOUTH EVERY DAY   aspirin 81 MG chewable tablet Chew 81 mg by mouth daily.    atorvastatin (LIPITOR) 10 MG tablet Take 1 tablet (10 mg total) by mouth daily.   Calcium Carb-Cholecalciferol (CALCIUM 600 + D PO) Take 1 tablet by mouth daily.   Multiple Vitamins-Minerals (CENTRUM SILVER ULTRA WOMENS) TABS Take 1 tablet by mouth daily.       12/16/2022    9:40 AM 12/05/2022    8:22 AM 08/08/2022    1:56 PM 06/12/2022   11:26 AM  GAD 7 : Generalized Anxiety Score  Nervous, Anxious, on Edge 0 0 0 0  Control/stop worrying 0 0 0 0  Worry too much - different things 0 0 0 0  Trouble relaxing 0 0 0 0  Restless 0 0 0 0  Easily annoyed or irritable 0 0 0 0  Afraid - awful might happen 0 0 0 0  Total GAD 7 Score 0 0 0 0  Anxiety Difficulty Not difficult at all Not difficult at all Not difficult at all Not difficult at all       12/16/2022    9:40 AM 12/05/2022    8:22 AM 09/11/2022    9:59 AM  Depression screen PHQ 2/9  Decreased Interest 0 0 0  Down, Depressed, Hopeless 0 0 0  PHQ - 2 Score 0 0 0  Altered sleeping 0 0 0  Tired, decreased energy 0 0 0  Change in appetite 0 0 0  Feeling bad or failure about yourself  0 0 0  Trouble concentrating 0 0 0  Moving slowly or fidgety/restless 0 0 0  Suicidal thoughts 0 0 0  PHQ-9 Score 0 0 0  Difficult doing work/chores Not difficult at all Not difficult at all Not difficult at all    BP Readings from Last 3 Encounters:  12/16/22 120/70  12/05/22 120/78  08/08/22 112/78    Physical Exam Vitals and nursing note reviewed. Exam conducted with a chaperone present.  Constitutional:      General: She is not in acute distress.    Appearance: She is not diaphoretic.  HENT:     Head: Normocephalic and atraumatic.     Right Ear: Tympanic membrane and external ear normal.     Left Ear: Tympanic membrane and external ear normal.     Nose:      Right Sinus: Maxillary sinus tenderness present. No frontal sinus tenderness.     Left Sinus: Maxillary sinus tenderness present. No frontal sinus tenderness.     Mouth/Throat:     Mouth: Mucous membranes are moist.     Pharynx: Oropharynx is clear. No pharyngeal swelling, oropharyngeal exudate, posterior oropharyngeal erythema or uvula swelling.     Tonsils: No tonsillar exudate.  Eyes:     General:        Right eye: No discharge.  Left eye: No discharge.     Conjunctiva/sclera: Conjunctivae normal.     Pupils: Pupils are equal, round, and reactive to light.  Neck:     Thyroid: No thyromegaly.     Vascular: No JVD.  Cardiovascular:     Rate and Rhythm: Normal rate and regular rhythm.     Heart sounds: Normal heart sounds. No murmur heard.    No friction rub. No gallop.  Pulmonary:     Effort: Pulmonary effort is normal.     Breath sounds: Normal breath sounds.  Abdominal:     General: Bowel sounds are normal.     Palpations: Abdomen is soft. There is no mass.     Tenderness: There is no abdominal tenderness. There is no guarding.  Musculoskeletal:        General: Normal range of motion.     Cervical back: Normal range of motion and neck supple.  Lymphadenopathy:     Cervical: No cervical adenopathy.  Skin:    General: Skin is warm and dry.  Neurological:     Mental Status: She is alert.     Deep Tendon Reflexes: Reflexes are normal and symmetric.     Wt Readings from Last 3 Encounters:  12/16/22 230 lb (104.3 kg)  12/05/22 230 lb (104.3 kg)  09/11/22 227 lb (103 kg)    BP 120/70   Pulse 64   Ht 5\' 2"  (1.575 m)   Wt 230 lb (104.3 kg)   SpO2 99%   BMI 42.07 kg/m   Assessment and Plan:  1. Acute maxillary sinusitis, recurrence not specified New onset.  Persistent.  Relatively stable.  Symptomatology and signs but physical exam is consistent with maxillary sinusitis.  Will treat with amoxicillin 500 mg 3 times a day for 10 days and will recheck on as-needed  basis. - amoxicillin (AMOXIL) 500 MG capsule; Take 1 capsule (500 mg total) by mouth 3 (three) times daily for 10 days.  Dispense: 30 capsule; Refill: 0  2. Acute cough Acute.  Episodic.  Difficulty to sleep at night.  Will prescribe promethazine dextromethorphan more for acute cough.  Patient has been encouraged to take plenty of fluids and water from mucus to be less viscous and more likely to be coughed up. - promethazine-dextromethorphan (PROMETHAZINE-DM) 6.25-15 MG/5ML syrup; Take 5 mLs by mouth 4 (four) times daily as needed.  Dispense: 118 mL; Refill: 0    Elizabeth Sauer, MD

## 2023-03-04 IMAGING — MG MM DIGITAL DIAGNOSTIC UNILAT*R* W/ TOMO W/ CAD
6 of 12 series · 6 of 36 positions shown · non-contrast
Comparison: Previous exam(s).

CLINICAL DATA: Callback for RIGHT breast asymmetry

EXAM:
DIGITAL DIAGNOSTIC UNILATERAL RIGHT MAMMOGRAM WITH TOMOSYNTHESIS AND
CAD
TECHNIQUE: Right digital diagnostic mammography and breast tomosynthesis was
performed. The images were evaluated with computer-aided detection.

[R MLO synth-2D (1 of 2)]
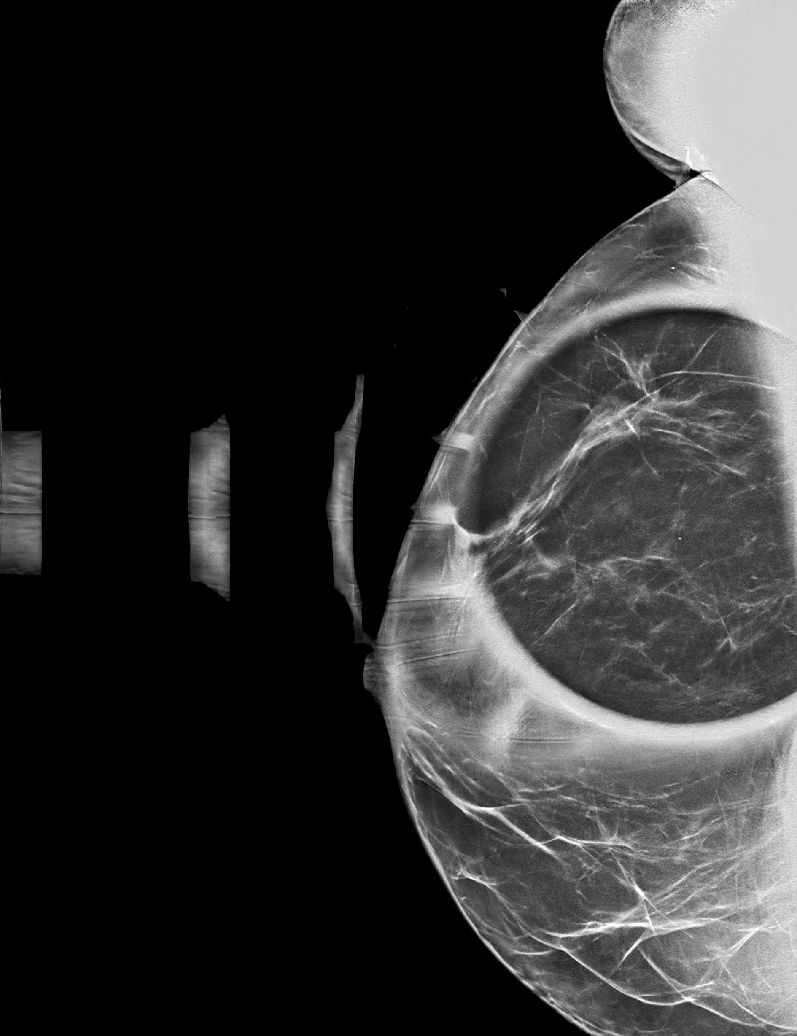

[R CC synth-2D (1 of 3)]
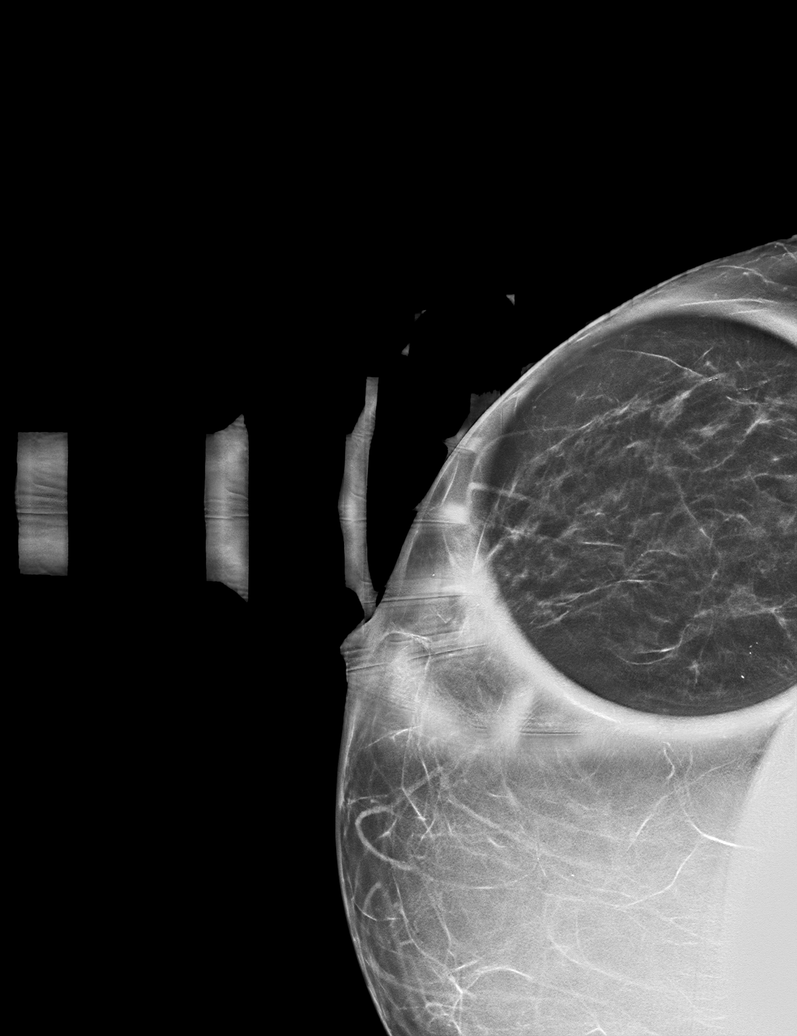

[R MLO synth-2D (2 of 2)]
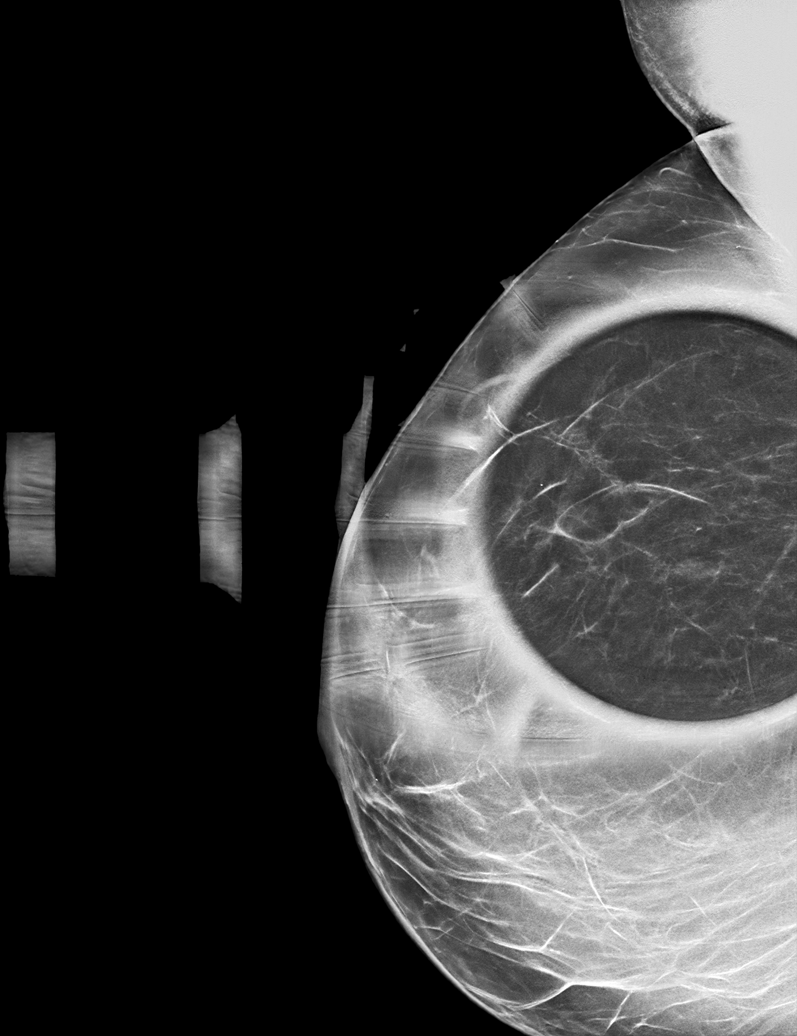

[R ML synth-2D]
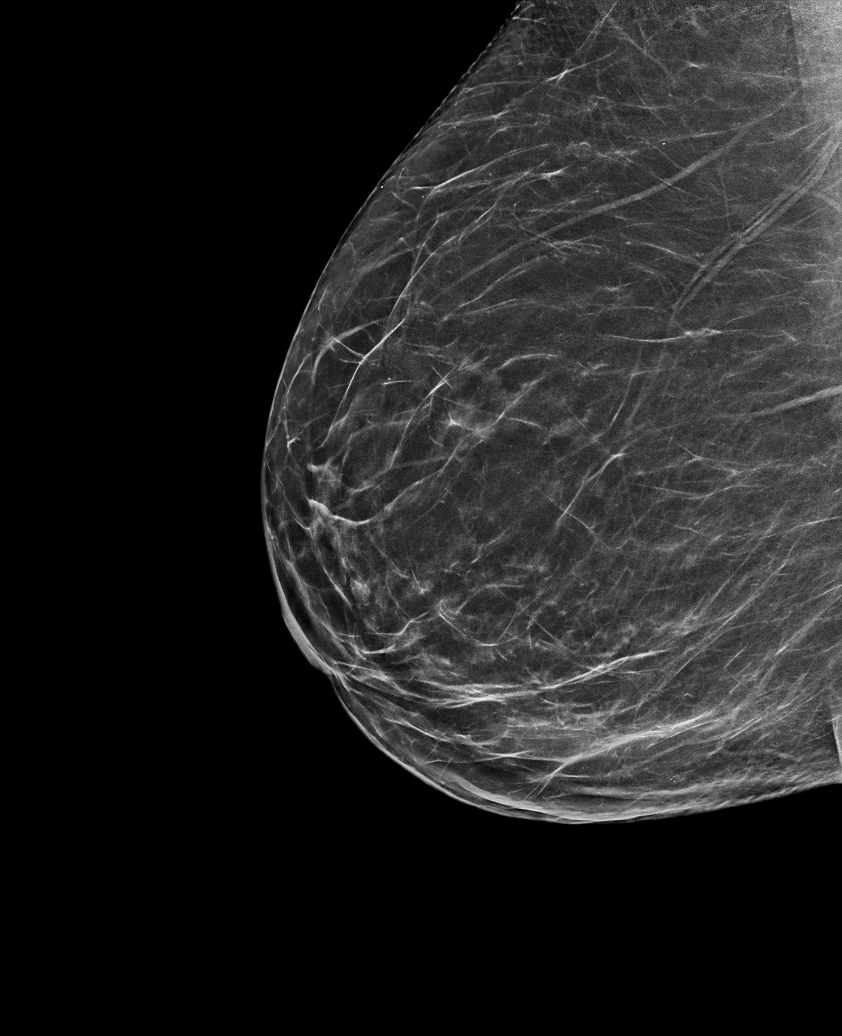

[R CC synth-2D (2 of 3)]
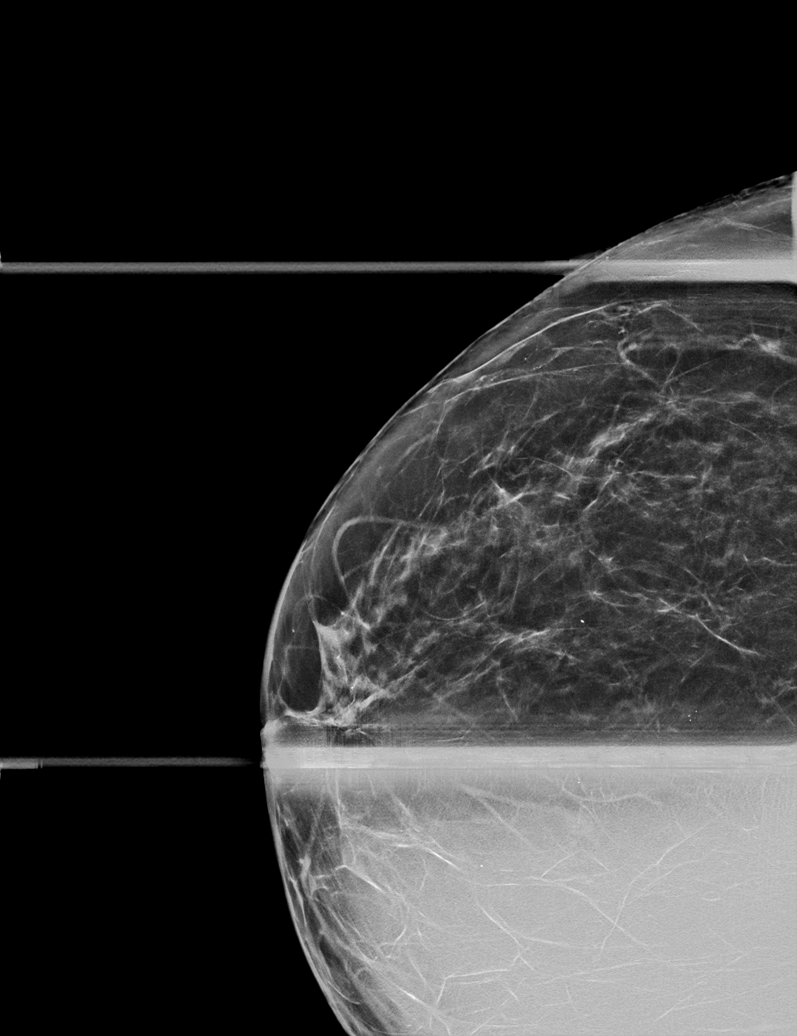

[R CC synth-2D (3 of 3)]
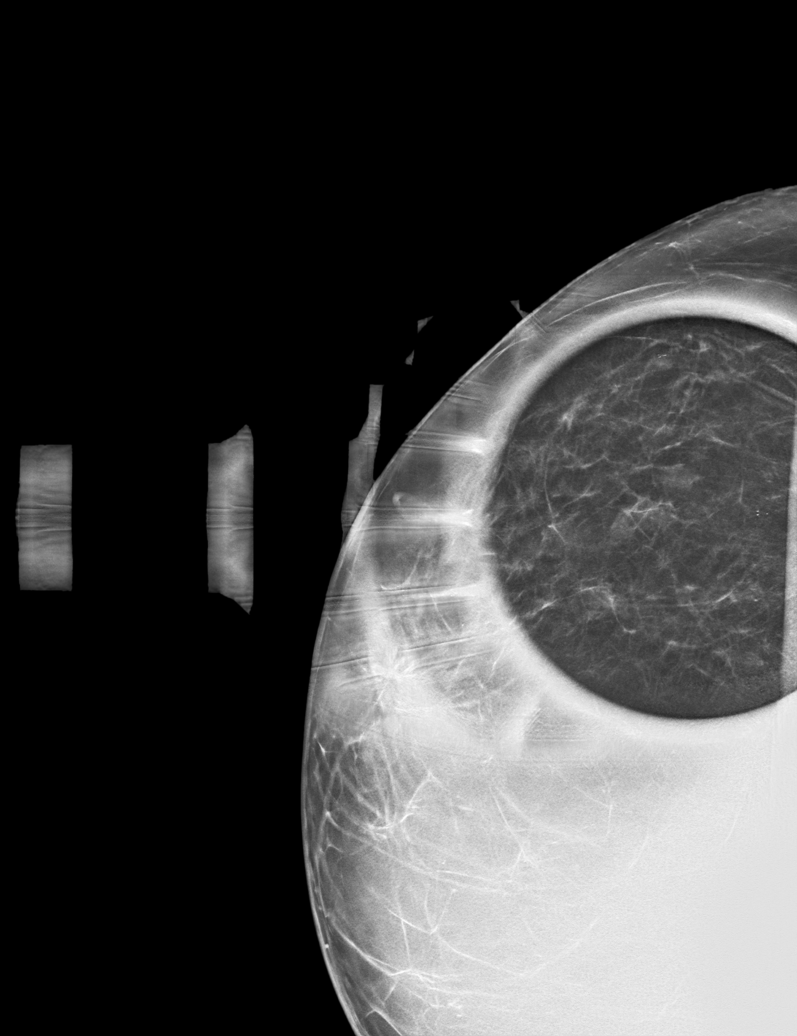

[6 of 36 positions shown; findings below may reference images not displayed]

ACR Breast Density Category b: There are scattered areas of
fibroglandular density.
FINDINGS: The previously described finding does not persist with additional
views, consistent with superimposed fibroglandular tissue. No
suspicious mass, microcalcification, or other finding is identified.
IMPRESSION: No mammographic evidence of malignancy.

RECOMMENDATION:
Screening mammogram in one year.(Code:T9-K-0OL)

I have discussed the findings and recommendations with the patient.
If applicable, a reminder letter will be sent to the patient
regarding the next appointment.

BI-RADS CATEGORY  1: Negative.

## 2023-05-06 DIAGNOSIS — G8929 Other chronic pain: Secondary | ICD-10-CM | POA: Diagnosis not present

## 2023-05-06 DIAGNOSIS — M1711 Unilateral primary osteoarthritis, right knee: Secondary | ICD-10-CM | POA: Diagnosis not present

## 2023-05-21 ENCOUNTER — Ambulatory Visit: Payer: Medicare HMO | Admitting: Family Medicine

## 2023-05-22 ENCOUNTER — Encounter: Payer: Self-pay | Admitting: Family Medicine

## 2023-05-22 ENCOUNTER — Other Ambulatory Visit
Admission: RE | Admit: 2023-05-22 | Discharge: 2023-05-22 | Disposition: A | Discharge status: Home / Self Care | Payer: Medicare HMO | Attending: Family Medicine | Admitting: Family Medicine

## 2023-05-22 ENCOUNTER — Ambulatory Visit (INDEPENDENT_AMBULATORY_CARE_PROVIDER_SITE_OTHER): Payer: Medicare HMO | Admitting: Family Medicine

## 2023-05-22 VITALS — BP 138/76 | HR 63 | Wt 227.0 lb

## 2023-05-22 DIAGNOSIS — E7801 Familial hypercholesterolemia: Secondary | ICD-10-CM | POA: Insufficient documentation

## 2023-05-22 DIAGNOSIS — I10 Essential (primary) hypertension: Secondary | ICD-10-CM | POA: Insufficient documentation

## 2023-05-22 LAB — COMPREHENSIVE METABOLIC PANEL
ALT: 16 U/L (ref 0–44)
AST: 15 U/L (ref 15–41)
Albumin: 4.7 g/dL (ref 3.5–5.0)
Alkaline Phosphatase: 77 U/L (ref 38–126)
Anion gap: 9 (ref 5–15)
BUN: 28 mg/dL — ABNORMAL HIGH (ref 8–23)
CO2: 24 mmol/L (ref 22–32)
Calcium: 9.5 mg/dL (ref 8.9–10.3)
Chloride: 105 mmol/L (ref 98–111)
Creatinine, Ser: 0.83 mg/dL (ref 0.44–1.00)
GFR, Estimated: >60 mL/min (ref >60)
Glucose, Bld: 89 mg/dL (ref 70–99)
Potassium: 4.2 mmol/L (ref 3.5–5.1)
Sodium: 138 mmol/L (ref 135–145)
Total Bilirubin: 0.9 mg/dL (ref <1.2)
Total Protein: 7.7 g/dL (ref 6.5–8.1)

## 2023-05-22 MED ORDER — ATORVASTATIN CALCIUM 10 MG PO TABS: 10.0000 mg | ORAL_TABLET | Freq: Every day | ORAL | 1 refills | Status: AC

## 2023-05-22 MED ORDER — AMLODIPINE BESY-BENAZEPRIL HCL 5-10 MG PO CAPS: ORAL_CAPSULE | ORAL | 1 refills | Status: DC

## 2023-05-22 NOTE — Patient Instructions (Addendum)

## 2023-05-22 NOTE — Progress Notes (Signed)
Date:  05/22/2023   Name:  Shannon Blake   DOB:  1955-06-30   MRN:  [id number]   Chief Complaint: Hypertension and Hyperlipidemia  Hypertension This is a chronic problem. The current episode started more than 1 year ago. The problem has been waxing and waning since onset. The problem is controlled. Pertinent negatives include no anxiety, blurred vision, chest pain, headaches, malaise/fatigue, neck pain, orthopnea, palpitations, peripheral edema, PND, shortness of breath or sweats. There are no associated agents to hypertension. There are no known risk factors for coronary artery disease. Past treatments include calcium channel blockers. The current treatment provides moderate improvement. There are no compliance problems.   Hyperlipidemia This is a chronic problem. The current episode started more than 1 year ago. The problem is controlled. She has no history of diabetes or hypothyroidism. Pertinent negatives include no chest pain, myalgias or shortness of breath. Current antihyperlipidemic treatment includes statins. The current treatment provides moderate improvement of lipids. There are no compliance problems.  Risk factors for coronary artery disease include dyslipidemia.    Lab Results  Component Value Date   NA 139 08/05/2022   K 4.0 08/05/2022   CO2 24 08/05/2022   GLUCOSE 128 (H) 08/05/2022   BUN 23 08/05/2022   CREATININE 1.05 (H) 08/05/2022   CALCIUM 9.6 08/05/2022   EGFR 96 06/03/2022   GFRNONAA 58 (L) 08/05/2022   Lab Results  Component Value Date   CHOL 166 12/05/2022   HDL 53 12/05/2022   LDLCALC 99 12/05/2022   TRIG 72 12/05/2022   CHOLHDL 3.7 01/03/2019   Lab Results  Component Value Date   TSH 2.30 12/28/2013   Lab Results  Component Value Date   HGBA1C 5.8 12/28/2013   Lab Results  Component Value Date   WBC 14.3 (H) 08/05/2022   HGB 13.0 08/05/2022   HCT 40.3 08/05/2022   MCV 82.9 08/05/2022   PLT 366 08/05/2022   Lab Results  Component Value  Date   ALT 16 08/05/2022   AST 20 08/05/2022   ALKPHOS 82 08/05/2022   BILITOT 0.5 08/05/2022   No results found for: "25OHVITD2", "25OHVITD3", "VD25OH"   Review of Systems  Constitutional: Negative.  Negative for chills, fatigue, fever, malaise/fatigue and unexpected weight change.  HENT:  Negative for congestion, ear discharge, ear pain, rhinorrhea, sinus pressure, sneezing and sore throat.   Eyes:  Negative for blurred vision.  Respiratory:  Negative for cough, shortness of breath, wheezing and stridor.   Cardiovascular:  Negative for chest pain, palpitations, orthopnea and PND.  Gastrointestinal:  Negative for abdominal pain, blood in stool, constipation, diarrhea and nausea.  Genitourinary:  Negative for dysuria, flank pain, frequency, hematuria, urgency and vaginal discharge.  Musculoskeletal:  Negative for arthralgias, back pain, myalgias and neck pain.  Skin:  Negative for rash.  Neurological:  Negative for dizziness, weakness and headaches.  Hematological:  Negative for adenopathy. Does not bruise/bleed easily.  Psychiatric/Behavioral:  Negative for dysphoric mood. The patient is not nervous/anxious.     Patient Active Problem List   Diagnosis Date Noted   BMI 40.0-44.9, adult (HCC) 06/27/2019   Familial hypercholesterolemia 06/27/2019   Special screening for malignant neoplasms, colon    Polyp of sigmoid colon    Family history of peritoneal cancer 07/20/2017   Status post total left knee replacement 08/27/2015   Calculus of kidney 10/17/2014   Alimentary obesity 10/17/2014   Cough 10/17/2014   Essential (primary) hypertension 10/17/2014   Combined fat and carbohydrate  induced hyperlipemia 10/17/2014   Derangement of knee 12/01/2013   Arthritis of knee, degenerative 12/01/2013    No Known Allergies  Past Surgical History:  Procedure Laterality Date   CHOLECYSTECTOMY     COLONOSCOPY  2007   Dr Bluford Kaufmann   COLONOSCOPY WITH PROPOFOL N/A 08/11/2017   Procedure:  COLONOSCOPY WITH PROPOFOL;  Surgeon: Midge Minium, MD;  Location: [facility] ENDOSCOPY;  Service: Endoscopy;  Laterality: N/A;   GALLBLADDER SURGERY     KNEE ARTHROPLASTY Left 08/27/2015   Procedure: COMPUTER ASSISTED TOTAL KNEE ARTHROPLASTY;  Surgeon: Donato Heinz, MD;  Location: [facility] ORS;  Service: Orthopedics;  Laterality: Left;   meniscal surgery Left     Social History   Tobacco Use   Smoking status: Never   Smokeless tobacco: Never  Vaping Use   Vaping status: Never Used  Substance Use Topics   Alcohol use: No    Alcohol/week: 0.0 standard drinks of alcohol   Drug use: No     Medication list has been reviewed and updated.  Current Meds  Medication Sig   amLODipine-benazepril (LOTREL) 5-10 MG capsule TAKE 1 CAPSULE BY MOUTH EVERY DAY   aspirin 81 MG chewable tablet Chew 81 mg by mouth daily.    atorvastatin (LIPITOR) 10 MG tablet Take 1 tablet (10 mg total) by mouth daily.   Calcium Carb-Cholecalciferol (CALCIUM 600 + D PO) Take 1 tablet by mouth daily.   [DISCONTINUED] Multiple Vitamins-Minerals (CENTRUM SILVER ULTRA WOMENS) TABS Take 1 tablet by mouth daily.   [DISCONTINUED] promethazine-dextromethorphan (PROMETHAZINE-DM) 6.25-15 MG/5ML syrup Take 5 mLs by mouth 4 (four) times daily as needed.       05/22/2023    8:45 AM 12/16/2022    9:40 AM 12/05/2022    8:22 AM 08/08/2022    1:56 PM  GAD 7 : Generalized Anxiety Score  Nervous, Anxious, on Edge 0 0 0 0  Control/stop worrying 0 0 0 0  Worry too much - different things 0 0 0 0  Trouble relaxing 0 0 0 0  Restless 0 0 0 0  Easily annoyed or irritable 0 0 0 0  Afraid - awful might happen 0 0 0 0  Total GAD 7 Score 0 0 0 0  Anxiety Difficulty Not difficult at all Not difficult at all Not difficult at all Not difficult at all       05/22/2023    8:45 AM 12/16/2022    9:40 AM 12/05/2022    8:22 AM  Depression screen PHQ 2/9  Decreased Interest 0 0 0  Down, Depressed, Hopeless 0 0 0  PHQ - 2 Score 0 0 0  Altered  sleeping 0 0 0  Tired, decreased energy 0 0 0  Change in appetite 0 0 0  Feeling bad or failure about yourself  0 0 0  Trouble concentrating 0 0 0  Moving slowly or fidgety/restless 0 0 0  Suicidal thoughts 0 0 0  PHQ-9 Score 0 0 0  Difficult doing work/chores Not difficult at all Not difficult at all Not difficult at all    BP Readings from Last 3 Encounters:  05/22/23 138/76  12/16/22 120/70  12/05/22 120/78    Physical Exam Vitals and nursing note reviewed. Exam conducted with a chaperone present.  Constitutional:      General: She is not in acute distress.    Appearance: She is not diaphoretic.  HENT:     Head: Normocephalic and atraumatic.     Right Ear: Tympanic membrane and  external ear normal.     Left Ear: Tympanic membrane and external ear normal.     Nose: Nose normal. No congestion or rhinorrhea.     Mouth/Throat:     Pharynx: Oropharynx is clear. No oropharyngeal exudate or posterior oropharyngeal erythema.  Eyes:     General:        Right eye: No discharge.        Left eye: No discharge.     Conjunctiva/sclera: Conjunctivae normal.     Pupils: Pupils are equal, round, and reactive to light.  Neck:     Thyroid: No thyromegaly.     Vascular: No JVD.  Cardiovascular:     Rate and Rhythm: Normal rate and regular rhythm.     Heart sounds: Normal heart sounds. No murmur heard.    No friction rub. No gallop.  Pulmonary:     Effort: Pulmonary effort is normal.     Breath sounds: Normal breath sounds. No wheezing, rhonchi or rales.  Chest:     Chest wall: No tenderness.  Abdominal:     General: Bowel sounds are normal.     Palpations: Abdomen is soft. There is no mass.     Tenderness: There is no abdominal tenderness. There is no guarding.  Musculoskeletal:        General: Normal range of motion.     Cervical back: Normal range of motion and neck supple.  Lymphadenopathy:     Cervical: No cervical adenopathy.  Skin:    General: Skin is warm and dry.   Neurological:     Mental Status: She is alert.     Deep Tendon Reflexes: Reflexes are normal and symmetric.     Wt Readings from Last 3 Encounters:  05/22/23 227 lb (103 kg)  12/16/22 230 lb (104.3 kg)  12/05/22 230 lb (104.3 kg)    BP 138/76   Pulse 63   Wt 227 lb (103 kg)   SpO2 97%   BMI 41.52 kg/m   Assessment and Plan:  1. Essential (primary) hypertension (Primary) Chronic.  Controlled.  Stable.  Blood pressure today 138/76.  Asymptomatic.  Tolerating medication well.  Continue amlodipine benazepril 5-10 mg once a day.  Will recheck patient in 6 months.  Will check CMP for hepatic concerns on statin.  Will also check for electrolytes and GFR. - amLODipine-benazepril (LOTREL) 5-10 MG capsule; TAKE 1 CAPSULE BY MOUTH EVERY DAY  Dispense: 90 capsule; Refill: 1 - Comprehensive metabolic panel  2. Familial hypercholesterolemia Chronic.  Controlled.  Stable.  Review of previous labs in well normal range on all areas.  Will continue atorvastatin 10 mg once a day.  Will check CMP for hepatic stabilization.  Patient has been given low-cholesterol low triglyceride guidelines to follow last labs were within 6 months and we will hold on labs at this time. - atorvastatin (LIPITOR) 10 MG tablet; Take 1 tablet (10 mg total) by mouth daily.  Dispense: 90 tablet; Refill: 1 - Comprehensive metabolic panel    Elizabeth Sauer, MD

## 2023-08-20 NOTE — Discharge Instructions (Addendum)
 Instructions after Total Knee Replacement   Shannon P. Angie Fava., M.D.    Dept. of Orthopaedics & Sports Medicine [facility] [address] [place], [place]  [address]  Phone: [phone]   Fax: [phone]       [email or web address]       DIET: Drink plenty of non-alcoholic fluids. Resume your normal diet. Include foods high in fiber.  ACTIVITY:  You may use crutches or a walker with weight-bearing as tolerated, unless instructed otherwise. You may be weaned off of the walker or crutches by your Physical Therapist.  Do NOT place pillows under the knee. Anything placed under the knee could limit your ability to straighten the knee.   Use the Bone Foam 3 times a day for 30 minutes each session to help straighten the knee. Continue doing gentle exercises. Exercising will reduce the pain and swelling, increase motion, and prevent muscle weakness.   Please continue to use the TED compression stockings for 6 weeks. You may remove the stockings at night, but should reapply them in the morning. Do not drive or operate any equipment until instructed.  WOUND CARE:  The initial dressing (Aquacel) can remain in place for 7 days (see separate instructions). Continue to use the PolarCare or ice packs periodically to reduce pain and swelling. You may bathe or shower after the staples are removed at the first office visit following surgery.  MEDICATIONS: You may resume your regular medications. Please take the pain medication as prescribed on the medication. Do not take pain medication on an empty stomach. Unless instructed otherwise, you should take an enteric-coated aspirin 81 mg. TWICE a day. (This along with elevation will help reduce the possibility of blood clots/phlebitis in your operated leg.) Use a stool softener (such as Senokot-S or Colace) daily and a laxative (such as Miralax or Dulcolax) as needed to prevent constipation.  Do not drive or drink alcoholic beverages when  taking pain medications.  CALL THE OFFICE FOR: Temperature above 101 degrees Excessive bleeding or drainage on the dressing. Excessive swelling, coldness, or paleness of the toes. Persistent nausea and vomiting.  FOLLOW-UP:  You should have an appointment to return to the office in 10-14 days after surgery. Arrangements have been made for continuation of Physical Therapy (either home therapy or outpatient therapy).     [facility] Department Directory         [email or web address]       [email or web address]          Cardiology  Appointments: [place] - [phone]  Endocrinology  Appointments: [place] [phone] [place] - [phone]  Gastroenterology  Appointments: [place] [phone] [place] - [phone]        General Surgery   Appointments: [facility]  Internal Medicine/Family Medicine  Appointments: [facility] [place] - [phone] [place] - [phone]  Metabolic and Weigh Loss Surgery  Appointments: [facility]        Neurology  Appointments: [place] [phone] [place] - [phone]  Neurosurgery  Appointments: [place]  Obstetrics & Gynecology  Appointments: [place] [phone] [place] - [phone]        Pediatrics  Appointments: [place] [phone] [place] - [phone]  Physiatry  Appointments: [place] [phone]  Physical Therapy  Appointments: [place] - [phone]        Podiatry  Appointments: [place] [phone] [place] - [phone]  Pulmonology  Appointments: [place]  Rheumatology  Appointments: [place] [phone]  [place] Location: [facility]  [address] [place], [place]  [address]  [place] Location: [facility]. [address] [place], [place]  [address]  [place]  Location: [facility] [address] [place], [place]  [address]     [employer]  [address] [place], [place], [address]. [phone] They will call you to arrange when they can come to see you

## 2023-08-26 ENCOUNTER — Other Ambulatory Visit: Payer: Self-pay

## 2023-08-26 ENCOUNTER — Encounter
Admission: RE | Admit: 2023-08-26 | Discharge: 2023-08-26 | Disposition: A | Discharge status: Home / Self Care | Source: Ambulatory Visit | Attending: Orthopedic Surgery | Admitting: Orthopedic Surgery

## 2023-08-26 VITALS — BP 180/86 | HR 58 | Temp 98.6°F | Resp 18 | Ht 62.0 in | Wt 227.1 lb

## 2023-08-26 DIAGNOSIS — R001 Bradycardia, unspecified: Secondary | ICD-10-CM | POA: Diagnosis not present

## 2023-08-26 DIAGNOSIS — Z01812 Encounter for preprocedural laboratory examination: Secondary | ICD-10-CM

## 2023-08-26 DIAGNOSIS — I10 Essential (primary) hypertension: Secondary | ICD-10-CM

## 2023-08-26 DIAGNOSIS — R8290 Unspecified abnormal findings in urine: Secondary | ICD-10-CM | POA: Diagnosis not present

## 2023-08-26 DIAGNOSIS — Z01818 Encounter for other preprocedural examination: Secondary | ICD-10-CM | POA: Insufficient documentation

## 2023-08-26 DIAGNOSIS — Z01810 Encounter for preprocedural cardiovascular examination: Secondary | ICD-10-CM | POA: Diagnosis not present

## 2023-08-26 DIAGNOSIS — M1711 Unilateral primary osteoarthritis, right knee: Secondary | ICD-10-CM | POA: Insufficient documentation

## 2023-08-26 HISTORY — DX: Polyp of colon: K63.5

## 2023-08-26 LAB — COMPREHENSIVE METABOLIC PANEL
ALT: 15 U/L (ref 0–44)
AST: 16 U/L (ref 15–41)
Albumin: 4.2 g/dL (ref 3.5–5.0)
Alkaline Phosphatase: 75 U/L (ref 38–126)
Anion gap: 8 (ref 5–15)
BUN: 19 mg/dL (ref 8–23)
CO2: 25 mmol/L (ref 22–32)
Calcium: 9.5 mg/dL (ref 8.9–10.3)
Chloride: 108 mmol/L (ref 98–111)
Creatinine, Ser: 0.79 mg/dL (ref 0.44–1.00)
GFR, Estimated: >60 mL/min (ref >60)
Glucose, Bld: 100 mg/dL — ABNORMAL HIGH (ref 70–99)
Potassium: 4.3 mmol/L (ref 3.5–5.1)
Sodium: 141 mmol/L (ref 135–145)
Total Bilirubin: 0.6 mg/dL (ref 0.0–1.2)
Total Protein: 7.4 g/dL (ref 6.5–8.1)

## 2023-08-26 LAB — URINALYSIS, ROUTINE W REFLEX MICROSCOPIC
Bilirubin Urine: NEGATIVE
Glucose, UA: NEGATIVE mg/dL
Hgb urine dipstick: NEGATIVE
Ketones, ur: NEGATIVE mg/dL
Nitrite: NEGATIVE
Protein, ur: NEGATIVE mg/dL
Specific Gravity, Urine: 1.025 (ref 1.005–1.030)
pH: 5 (ref 5.0–8.0)

## 2023-08-26 LAB — CBC
HCT: 40.6 % (ref 36.0–46.0)
Hemoglobin: 13.4 g/dL (ref 12.0–15.0)
MCH: 27.9 pg (ref 26.0–34.0)
MCHC: 33 g/dL (ref 30.0–36.0)
MCV: 84.4 fL (ref 80.0–100.0)
Platelets: 412 10*3/uL — ABNORMAL HIGH (ref 150–400)
RBC: 4.81 MIL/uL (ref 3.87–5.11)
RDW: 13.3 % (ref 11.5–15.5)
WBC: 6.1 10*3/uL (ref 4.0–10.5)
nRBC: 0 % (ref 0.0–0.2)

## 2023-08-26 LAB — SURGICAL PCR SCREEN
MRSA, PCR: NEGATIVE
Staphylococcus aureus: POSITIVE — AB

## 2023-08-26 LAB — SEDIMENTATION RATE: Sed Rate: 14 mm/h (ref 0–30)

## 2023-08-26 LAB — C-REACTIVE PROTEIN: CRP: 0.7 mg/dL (ref <1.0)

## 2023-08-26 NOTE — Patient Instructions (Addendum)
 Your procedure is scheduled on:  Wednesday March 26  Report to the Registration Desk on the 1st floor of the [facility] To find out your arrival time, please call [phone] between 1PM - 3PM on:   Tuesday March 25  If your arrival time is 6:00 am, do not arrive before that time as the [facility] entrance doors do not open until 6:00 am.  REMEMBER: Instructions that are not followed completely may result in serious medical risk, up to and including death; or upon the discretion of your surgeon and anesthesiologist your surgery may need to be rescheduled.  Do not eat food after midnight the night before surgery.  No gum chewing or hard candies.  You may however, drink CLEAR liquids up to 2 hours before you are scheduled to arrive for your surgery. Do not drink anything within 2 hours of your scheduled arrival time.  Clear liquids include: - water  - apple juice without pulp - gatorade (not RED colors) - black coffee or tea (Do NOT add milk or creamers to the coffee or tea) Do NOT drink anything that is not on this list.  In addition, your doctor has ordered for you to drink the provided:  Ensure Pre-Surgery Clear Carbohydrate Drink   Drinking this carbohydrate drink up to two hours before surgery helps to reduce insulin resistance and improve patient outcomes. Please complete drinking 2 hours before scheduled arrival time.  One week prior to surgery: Starting  Wednesday March 19  Stop Anti-inflammatories (NSAIDS) such as Advil, Aleve, Ibuprofen, Motrin, Naproxen, Naprosyn and Aspirin based products such as Excedrin, Goody's Powder, BC Powder. Stop ANY OVER THE COUNTER supplements until after surgery. Multiple Vitamins-Minerals (MULTIVITAMIN WITH MINERALS)  TURMERIC CURCUMIN PO  Calcium Carb-Cholecalciferol (CALCIUM 600 + D PO)  You may however, continue to take Tylenol if needed for pain up until the day of surgery.  **Follow recommendations regarding stopping blood  thinners.** aspirin 81 may continue, hold the day of surgery  Continue taking all of your other prescription medications up until the day of surgery.  ON THE DAY OF SURGERY DO NOT TAKE ANY MEDICATIONS  No Alcohol for 24 hours before or after surgery.  Do not use any "recreational" drugs for at least a week (preferably 2 weeks) before your surgery.  Please be advised that the combination of cocaine and anesthesia may have negative outcomes, up to and including death. If you test positive for cocaine, your surgery will be cancelled.  On the morning of surgery brush your teeth with toothpaste and water, you may rinse your mouth with mouthwash if you wish. Do not swallow any toothpaste or mouthwash.  Use CHG Soap as directed on instruction sheet.  Do not wear jewelry, make-up, hairpins, clips or nail polish.  For welded (permanent) jewelry: bracelets, anklets, waist bands, etc.  Please have this removed prior to surgery.  If it is not removed, there is a chance that hospital personnel will need to cut it off on the day of surgery.  Do not wear lotions, powders, or perfumes.   Do not shave body hair from the neck down 48 hours before surgery.  Contact lenses, hearing aids and dentures may not be worn into surgery.  Do not bring valuables to the hospital. [facility] is not responsible for any missing/lost belongings or valuables.   Notify your doctor if there is any change in your medical condition (cold, fever, infection).  Wear comfortable clothing (specific to your surgery type) to  the hospital.  After surgery, you can help prevent lung complications by doing breathing exercises.  Take deep breaths and cough every 1-2 hours. Your doctor may order a device called an Incentive Spirometer to help you take deep breaths.  If you are being admitted to the hospital overnight, leave your suitcase in the car. After surgery it may be brought to your room.  In case of increased patient  census, it may be necessary for you, the patient, to continue your postoperative care in the Same Day Surgery department.  If you are being discharged the day of surgery, you will not be allowed to drive home. You will need a responsible individual to drive you home and stay with you for 24 hours after surgery.   If you are taking public transportation, you will need to have a responsible individual with you.  Please call the Pre-admissions Testing Dept. at [phone] if you have any questions about these instructions.  Surgery Visitation Policy:  Patients having surgery or a procedure may have two visitors.  Children under the age of 70 must have an adult with them who is not the patient.  Temporary Visitor Restrictions Due to increasing cases of flu, RSV and COVID-19: Children ages 67 and under will not be able to visit patients in [facility] hospitals under most circumstances.  Inpatient Visitation:    Visiting hours are 7 a.m. to 8 p.m. Up to four visitors are allowed at one time in a patient room. The visitors may rotate out with other people during the day.  One visitor age 54 or older may stay with the patient overnight and must be in the room by 8 p.m.       Pre-operative 5 CHG Bath Instructions   You can play a key role in reducing the risk of infection after surgery. Your skin needs to be as free of germs as possible. You can reduce the number of germs on your skin by washing with CHG (chlorhexidine gluconate) soap before surgery. CHG is an antiseptic soap that kills germs and continues to kill germs even after washing.   DO NOT use if you have an allergy to chlorhexidine/CHG or antibacterial soaps. If your skin becomes reddened or irritated, stop using the CHG and notify one of our RNs at [phone].   Please shower with the CHG soap starting 4 days before surgery using the following schedule:   STARTING SATURDAY MARCH 22    Please keep in mind the  following:  DO NOT shave, including legs and underarms, starting the day of your first shower.   You may shave your face at any point before/day of surgery.  Place clean sheets on your bed the day you start using CHG soap. Use a clean washcloth (not used since being washed) for each shower. DO NOT sleep with pets once you start using the CHG.   CHG Shower Instructions:  If you choose to wash your hair and private area, wash first with your normal shampoo/soap.  After you use shampoo/soap, rinse your hair and body thoroughly to remove shampoo/soap residue.  Turn the water OFF and apply about 3 tablespoons (45 ml) of CHG soap to a CLEAN washcloth.  Apply CHG soap ONLY FROM YOUR NECK DOWN TO YOUR TOES (washing for 3-5 minutes)  DO NOT use CHG soap on face, private areas, open wounds, or sores.  Pay special attention to the area where your surgery is being performed.  If you are having back  surgery, having someone wash your back for you may be helpful. Wait 2 minutes after CHG soap is applied, then you may rinse off the CHG soap.  Pat dry with a clean towel  Put on clean clothes/pajamas   If you choose to wear lotion, please use ONLY the CHG-compatible lotions on the back of this paper.     Additional instructions for the day of surgery: DO NOT APPLY any lotions, deodorants, cologne, or perfumes.   Put on clean/comfortable clothes.  Brush your teeth.  Ask your nurse before applying any prescription medications to the skin.      CHG Compatible Lotions   Aveeno Moisturizing lotion  Cetaphil Moisturizing Cream  Cetaphil Moisturizing Lotion  Clairol Herbal Essence Moisturizing Lotion, Dry Skin  Clairol Herbal Essence Moisturizing Lotion, Extra Dry Skin  Clairol Herbal Essence Moisturizing Lotion, Normal Skin  Curel Age Defying Therapeutic Moisturizing Lotion with Alpha Hydroxy  Curel Extreme Care Body Lotion  Curel Soothing Hands Moisturizing Hand Lotion  Curel Therapeutic  Moisturizing Cream, Fragrance-Free  Curel Therapeutic Moisturizing Lotion, Fragrance-Free  Curel Therapeutic Moisturizing Lotion, Original Formula  Eucerin Daily Replenishing Lotion  Eucerin Dry Skin Therapy Plus Alpha Hydroxy Crme  Eucerin Dry Skin Therapy Plus Alpha Hydroxy Lotion  Eucerin Original Crme  Eucerin Original Lotion  Eucerin Plus Crme Eucerin Plus Lotion  Eucerin TriLipid Replenishing Lotion  Keri Anti-Bacterial Hand Lotion  Keri Deep Conditioning Original Lotion Dry Skin Formula Softly Scented  Keri Deep Conditioning Original Lotion, Fragrance Free Sensitive Skin Formula  Keri Lotion Fast Absorbing Fragrance Free Sensitive Skin Formula  Keri Lotion Fast Absorbing Softly Scented Dry Skin Formula  Keri Original Lotion  Keri Skin Renewal Lotion Keri Silky Smooth Lotion  Keri Silky Smooth Sensitive Skin Lotion  Nivea Body Creamy Conditioning Oil  Nivea Body Extra Enriched Lotion  Nivea Body Original Lotion  Nivea Body Sheer Moisturizing Lotion Nivea Crme  Nivea Skin Firming Lotion  NutraDerm 30 Skin Lotion  NutraDerm Skin Lotion  NutraDerm Therapeutic Skin Cream  NutraDerm Therapeutic Skin Lotion  ProShield Protective Hand Cream  Provon moisturizing lotion      How to Use an Incentive Spirometer  An incentive spirometer is a tool that measures how well you are filling your lungs with each breath. Learning to take long, deep breaths using this tool can help you keep your lungs clear and active. This may help to reverse or lessen your chance of developing breathing (pulmonary) problems, especially infection. You may be asked to use a spirometer: After a surgery. If you have a lung problem or a history of smoking. After a long period of time when you have been unable to move or be active. If the spirometer includes an indicator to show the highest number that you have reached, your health care provider or respiratory therapist will help you set a goal. Keep a log  of your progress as told by your health care provider. What are the risks? Breathing too quickly may cause dizziness or cause you to pass out. Take your time so you do not get dizzy or light-headed. If you are in pain, you may need to take pain medicine before doing incentive spirometry. It is harder to take a deep breath if you are having pain. How to use your incentive spirometer  Sit up on the edge of your bed or on a chair. Hold the incentive spirometer so that it is in an upright position. Before you use the spirometer, breathe out normally. Place the mouthpiece  in your mouth. Make sure your lips are closed tightly around it. Breathe in slowly and as deeply as you can through your mouth, causing the piston or the ball to rise toward the top of the chamber. Hold your breath for 3-5 seconds, or for as long as possible. If the spirometer includes a coach indicator, use this to guide you in breathing. Slow down your breathing if the indicator goes above the marked areas. Remove the mouthpiece from your mouth and breathe out normally. The piston or ball will return to the bottom of the chamber. Rest for a few seconds, then repeat the steps 10 or more times. Take your time and take a few normal breaths between deep breaths so that you do not get dizzy or light-headed. Do this every 1-2 hours when you are awake. If the spirometer includes a goal marker to show the highest number you have reached (best effort), use this as a goal to work toward during each repetition. After each set of 10 deep breaths, cough a few times. This will help to make sure that your lungs are clear. If you have an incision on your chest or abdomen from surgery, place a pillow or a rolled-up towel firmly against the incision when you cough. This can help to reduce pain while taking deep breaths and coughing. General tips When you are able to get out of bed: Walk around often. Continue to take deep breaths and cough in  order to clear your lungs. Keep using the incentive spirometer until your health care provider says it is okay to stop using it. If you have been in the hospital, you may be told to keep using the spirometer at home. Contact a health care provider if: You are having difficulty using the spirometer. You have trouble using the spirometer as often as instructed. Your pain medicine is not giving enough relief for you to use the spirometer as told. You have a fever. Get help right away if: You develop shortness of breath. You develop a cough with bloody mucus from the lungs. You have fluid or blood coming from an incision site after you cough. Summary An incentive spirometer is a tool that can help you learn to take long, deep breaths to keep your lungs clear and active. You may be asked to use a spirometer after a surgery, if you have a lung problem or a history of smoking, or if you have been inactive for a long period of time. Use your incentive spirometer as instructed every 1-2 hours while you are awake. If you have an incision on your chest or abdomen, place a pillow or a rolled-up towel firmly against your incision when you cough. This will help to reduce pain. Get help right away if you have shortness of breath, you cough up bloody mucus, or blood comes from your incision when you cough. This information is not intended to replace advice given to you by your health care provider. Make sure you discuss any questions you have with your health care provider. Document Revised: 08/15/2019 Document Reviewed: 08/15/2019 Elsevier Patient Education  2023 Elsevier Inc.            Preoperative Educational Videos for Total Hip, Knee and Shoulder Replacements  To better prepare for surgery, please view our videos that explain the physical activity and discharge planning required to have the best surgical recovery at [facility].  [email or web address]  Questions? Call [phone] or email [email or web address]

## 2023-08-28 LAB — URINE CULTURE: Culture: 80000 — AB

## 2023-08-28 NOTE — H&P (Signed)
 ORTHOPAEDIC HISTORY & PHYSICAL Shannon Blake, [place] - 08/24/2023 3:45 PM EDT Formatting of this note is different from the original. NAME: Shannon Blake H&P Date: 08/24/2023 Procedure Date: 09/02/2023  Chief Complaint: right knee pain  HPI Shannon Blake is a 68 y.o. female who has severe Right knee pain. She reports that this pain has persisted for approximately the last 6 months. She states that most of the pain localizes along the medial aspect of her knee. She does report intermittent swelling and locking symptoms. Does report that the knee will give away often. She states that the pain is made worse with any prolonged ambulation, weightbearing or standing. It has begun to affect her ability to ambulate long distances and perform her ADLs. She has failed conservative treatment including Tylenol, turmeric, intra-articular corticosteroid injections, and activity modification. She is not currently utilizing any bracing or ambulatory aids . She has requested operative intervention for relief of her DJD symptoms. She denies any history of cardiac or pulmonary issues. No DVTs or clots in the past. Denies any previous surgeries on her right knee. Patient is not a diabetic  Social Hx: Patient lives at home with her significant other, Shannon Blake. She does work as a Lawyer. She denies any alcohol or illicit drug use. No nicotine use or smoking.  Medications & Allergies Allergies: No Known Allergies  Home Medicines: Current Outpatient Medications on File Prior to Visit Medication Sig Dispense Refill acetaminophen (TYLENOL) 500 MG tablet Take 1,000 mg by mouth 2 (two) times daily as needed amLODIPine-benazepril (LOTREL) 5-10 mg capsule Take 1 capsule by mouth once daily 5 amoxicillin (AMOXIL) 500 MG capsule TAKE 4 CAPSULES (2,000 MG TOTAL) BY MOUTH ONCE FOR 1 DOSE aspirin 81 MG chewable tablet Take 81 mg by mouth once daily. atorvastatin (LIPITOR) 10 MG tablet Take 1 tablet by mouth once  daily calcium carbonate 600 mg calcium (1,500 mg) Tab tablet Take 600 mg by mouth 2 (two) times daily with meals. multivitamin tablet Take 1 tablet by mouth once daily.  No current facility-administered medications on file prior to visit.  Medical / Surgical History  Past Medical History: Diagnosis Date Hypertension Shingles 02/11/2020 Left head,scalp,cheek,lips,& mouth   Past Surgical History: Procedure Laterality Date CHOLECYSTECTOMY 09/07/11 Left knee arthroscopy, partial medial meniscectomy, and chondroplasty. Left 02/27/14 Left total knee arthroplasty using computer-assissted navigation 08/27/2015 Dr Ernest Pine   Physical Exam  Ht: Wt: BMI: There is no height or weight on file to calculate BMI.  General/Constitutional: No apparent distress: well-nourished and well developed. Eyes: Pupils equal, round with synchronous movement. Lymphatic: No palpable adenopathy. Respiratory: Patient has good chest rise and fall with inspiration and expiration. All lung fields are clear to auscultation bilaterally. There is no Rales, rhonchi or wheezes appreciated. Cardiovascular: Upon auscultation there is a regular rate and rhythm without any murmurs, rubs, gallops or heaves appreciated. There does not appear to be any swelling down the lower extremities. Posterior tibial pulses appreciated bilaterally, 2+. Integumentary: No impressive skin lesions present, except as noted in detailed exam. Neuro/Psych: Normal mood and affect, oriented to person, place and time. Musculoskeletal: see exam below  Right knee exam Upon inspection of the patient's right knee there does not appear to be any skin changes, open abrasions, swelling or redness. There is a varus alignment. Upon palpation, the patient reports having pain along the medial aspect of their knee. Patient has full extension actively with ROM, and able to flex back to 110 with mild pain. Varus and valgus stress  testing shows positive pseudo laxity  to valgus stressing. The patella tracks well within the femoral groove from flexion into extension with mild crepitus. Anterior and posterior drawer testing negative. Patient is neurovascularly intact down their lower extremity to all dermatomes. Posterior tibial pulses appreciated 2+.  Imaging right Knee Imaging: A series of x-rays were ordered and interpreted of the patient's right knee. These images included AP standing, lateral and sunrise views. Upon inspection, there is noticeable degenerative changes involving primarily the medial compartment with 100% joint space narrowing along the medial cartilage space, with bone-on-bone articulation noted. Subchondral changes are noted. Osteophyte formation is present. Overall alignment is mild varus. No fractures, lytic lesions or gross forms appreciated on films.  Assesment and Plan Knee DJD  I have recommended that Darrol Angel Bachtell undergo right total knee replacement. The risks, benefits, prognosis and alternatives including but not limited to DVT, PE, infection, neurovascular injury, failure of the procedure and death were explained to the patient and she is willing to proceed with surgery as described to her by myself. Plan will be for post operative admission of at least 1 midnight for pain control and PT. She will be managed with DVT prophylaxis, antibiotics preoperatively for 24 hours and aggressive in patient rehab.  Pre, intra and post op interventions were discussed. Patient has good understanding  Medication Reconciliation was performed. Discussed cessation of vitamins and supplements.  A total of 45 minutes was spent reviewing patient's charts, medical reconciliation, discussing/educating the patient about surgical interventions, and answering any questions provided by the patient.  JOSHUA Kendrick Fries, PA [facility] 08/24/2023  Electronically signed by Shannon Rover, PA at 08/24/2023 4:41 PM EDT

## 2023-08-28 NOTE — Progress Notes (Signed)
  [facility] Perioperative Services: Pre-Admission/Anesthesia Testing  Abnormal Lab Notification   Date: 08/28/23  Name: Shannon Blake MRN:   [id number]  Re: Abnormal labs noted during PAT appointment   Notified:  Provider Name Provider Role Notification Mode  Francesco Sor, MD Orthopedics (Surgeon) Routed and/or faxed via [facility]   Abnormal Lab Value(s):   Lab Results  Component Value Date   COLORURINE YELLOW (A) 08/26/2023   APPEARANCEUR HAZY (A) 08/26/2023   LABSPEC 1.025 08/26/2023   PHURINE 5.0 08/26/2023   GLUCOSEU NEGATIVE 08/26/2023   HGBUR NEGATIVE 08/26/2023   BILIRUBINUR NEGATIVE 08/26/2023   KETONESUR NEGATIVE 08/26/2023   PROTEINUR NEGATIVE 08/26/2023   UROBILINOGEN 0.2 08/08/2022   NITRITE NEGATIVE 08/26/2023   LEUKOCYTESUR TRACE (A) 08/26/2023   EPIU 0-5 08/26/2023   WBCU 6-10 08/26/2023   RBCU 0-5 08/26/2023   BACTERIA FEW (A) 08/26/2023   CULT (A) 08/26/2023    80,000 COLONIES/mL ESCHERICHIA COLI SUSCEPTIBILITIES TO FOLLOW CULTURE REINCUBATED FOR BETTER GROWTH Performed at [facility], [address] [place], [place] [address]    Clinical Information and Notes:  Patient is scheduled for ARTHROPLASTY, KNEE, TOTAL, USING IMAGELESS COMPUTER-ASSISTED NAVIGATION (Right: Knee) on 09/02/2023.    UA performed in PAT consistent with/concerning for infection.  No leukocytosis noted on CBC; WBC 6.1 Renal function: Estimated Creatinine Clearance: 75.8 mL/min (by C-G formula based on SCr of 0.79 mg/dL). Urine C&S added to assess for pathogenically significant growth.  Impression and Plan:  Shannon Blake with a UA that was (+) for infection; reflex culture sent. Preliminary culture (+) for significant Escherichia coli colony count; final pathogen ID and susceptibilities pending. Will plan on forwarding final culture results to MD as they become available to me. Sending results for review and consideration of preoperative treatment as deemed  appropriate by Dr. Ernest Pine.   Quentin Mulling, MSN, APRN, FNP-C, CEN [facility]  Perioperative Services Nurse Practitioner Phone: [phone] Fax: [phone] 08/28/23 9:15 AM  NOTE: This note has been prepared using Dragon dictation software. Despite my best ability to proofread, there is always the potential that unintentional transcriptional errors may still occur from this process.

## 2023-09-02 ENCOUNTER — Other Ambulatory Visit: Payer: Self-pay

## 2023-09-02 ENCOUNTER — Observation Stay

## 2023-09-02 ENCOUNTER — Encounter: Payer: Self-pay | Admitting: Orthopedic Surgery

## 2023-09-02 ENCOUNTER — Ambulatory Visit: Payer: Self-pay | Admitting: Urgent Care

## 2023-09-02 ENCOUNTER — Encounter
Admission: RE | Disposition: A | Discharge status: Home / Self Care | Payer: Self-pay | Source: Home / Self Care | Attending: Orthopedic Surgery

## 2023-09-02 ENCOUNTER — Observation Stay
Admission: RE | Admit: 2023-09-02 | Discharge: 2023-09-03 | Disposition: A | Discharge status: Home / Self Care | Attending: Orthopedic Surgery | Admitting: Orthopedic Surgery

## 2023-09-02 DIAGNOSIS — I10 Essential (primary) hypertension: Secondary | ICD-10-CM | POA: Diagnosis not present

## 2023-09-02 DIAGNOSIS — Z79899 Other long term (current) drug therapy: Secondary | ICD-10-CM | POA: Insufficient documentation

## 2023-09-02 DIAGNOSIS — Z96652 Presence of left artificial knee joint: Secondary | ICD-10-CM | POA: Diagnosis not present

## 2023-09-02 DIAGNOSIS — M1711 Unilateral primary osteoarthritis, right knee: Secondary | ICD-10-CM | POA: Diagnosis present

## 2023-09-02 DIAGNOSIS — Z7982 Long term (current) use of aspirin: Secondary | ICD-10-CM | POA: Diagnosis not present

## 2023-09-02 DIAGNOSIS — Z96651 Presence of right artificial knee joint: Secondary | ICD-10-CM

## 2023-09-02 HISTORY — PX: KNEE ARTHROPLASTY: SHX992

## 2023-09-02 SURGERY — ARTHROPLASTY, KNEE, TOTAL, USING IMAGELESS COMPUTER-ASSISTED NAVIGATION
Anesthesia: Spinal | Site: Knee | Laterality: Right

## 2023-09-02 MED ORDER — TRANEXAMIC ACID-NACL 1000-0.7 MG/100ML-% IV SOLN
INTRAVENOUS | Status: AC
  Filled 2023-09-02: qty 100

## 2023-09-02 MED ORDER — DEXAMETHASONE SODIUM PHOSPHATE 10 MG/ML IJ SOLN
8.0000 mg | Freq: Once | INTRAMUSCULAR | Status: AC
  Administered 2023-09-02: 8 mg via INTRAVENOUS

## 2023-09-02 MED ORDER — SENNOSIDES-DOCUSATE SODIUM 8.6-50 MG PO TABS
1.0000 | ORAL_TABLET | Freq: Two times a day (BID) | ORAL | Status: DC
  Administered 2023-09-02 – 2023-09-03 (×3): 1 via ORAL
  Filled 2023-09-02 (×3): qty 1

## 2023-09-02 MED ORDER — PHENYLEPHRINE 80 MCG/ML (10ML) SYRINGE FOR IV PUSH (FOR BLOOD PRESSURE SUPPORT)
PREFILLED_SYRINGE | INTRAVENOUS | Status: AC
  Filled 2023-09-02: qty 10

## 2023-09-02 MED ORDER — CEFAZOLIN SODIUM-DEXTROSE 2-4 GM/100ML-% IV SOLN
2.0000 g | INTRAVENOUS | Status: AC
  Administered 2023-09-02: 2 g via INTRAVENOUS

## 2023-09-02 MED ORDER — OXYCODONE HCL 5 MG PO TABS
5.0000 mg | ORAL_TABLET | Freq: Once | ORAL | Status: AC | PRN
  Administered 2023-09-02: 5 mg via ORAL

## 2023-09-02 MED ORDER — BUPIVACAINE HCL (PF) 0.5 % IJ SOLN
INTRAMUSCULAR | Status: AC
  Filled 2023-09-02: qty 10

## 2023-09-02 MED ORDER — METOCLOPRAMIDE HCL 10 MG PO TABS
10.0000 mg | ORAL_TABLET | Freq: Three times a day (TID) | ORAL | Status: DC
  Administered 2023-09-02 – 2023-09-03 (×3): 10 mg via ORAL
  Filled 2023-09-02 (×3): qty 1

## 2023-09-02 MED ORDER — BUPIVACAINE LIPOSOME 1.3 % IJ SUSP
INTRAMUSCULAR | Status: AC
  Filled 2023-09-02: qty 40

## 2023-09-02 MED ORDER — CHLORHEXIDINE GLUCONATE 0.12 % MT SOLN
OROMUCOSAL | Status: AC
  Filled 2023-09-02: qty 15

## 2023-09-02 MED ORDER — PROPOFOL 1000 MG/100ML IV EMUL
INTRAVENOUS | Status: AC
  Filled 2023-09-02: qty 100

## 2023-09-02 MED ORDER — MENTHOL 3 MG MT LOZG: 1.0000 | LOZENGE | OROMUCOSAL | Status: DC | PRN

## 2023-09-02 MED ORDER — CELECOXIB 200 MG PO CAPS
200.0000 mg | ORAL_CAPSULE | Freq: Two times a day (BID) | ORAL | Status: DC
  Administered 2023-09-02 – 2023-09-03 (×2): 200 mg via ORAL
  Filled 2023-09-02 (×2): qty 1

## 2023-09-02 MED ORDER — ACETAMINOPHEN 10 MG/ML IV SOLN
INTRAVENOUS | Status: DC | PRN
  Administered 2023-09-02: 1000 mg via INTRAVENOUS

## 2023-09-02 MED ORDER — BUPIVACAINE HCL (PF) 0.5 % IJ SOLN
INTRAMUSCULAR | Status: DC | PRN
  Administered 2023-09-02: 15 mL

## 2023-09-02 MED ORDER — LACTATED RINGERS IV SOLN: INTRAVENOUS | Status: AC

## 2023-09-02 MED ORDER — ONDANSETRON HCL 4 MG PO TABS: 4.0000 mg | ORAL_TABLET | Freq: Four times a day (QID) | ORAL | Status: DC | PRN

## 2023-09-02 MED ORDER — PROPOFOL 10 MG/ML IV BOLUS
INTRAVENOUS | Status: AC
  Filled 2023-09-02: qty 20

## 2023-09-02 MED ORDER — MIDAZOLAM HCL 5 MG/5ML IJ SOLN
INTRAMUSCULAR | Status: DC | PRN
  Administered 2023-09-02: 2 mg via INTRAVENOUS

## 2023-09-02 MED ORDER — SODIUM CHLORIDE 0.9 % IR SOLN
Status: DC | PRN
  Administered 2023-09-02: 3000 mL

## 2023-09-02 MED ORDER — FENTANYL CITRATE (PF) 100 MCG/2ML IJ SOLN
INTRAMUSCULAR | Status: AC
Start: 2023-09-02 — End: ?
  Filled 2023-09-02: qty 2

## 2023-09-02 MED ORDER — TRANEXAMIC ACID-NACL 1000-0.7 MG/100ML-% IV SOLN
1000.0000 mg | INTRAVENOUS | Status: AC
  Administered 2023-09-02: 1000 mg via INTRAVENOUS

## 2023-09-02 MED ORDER — BENAZEPRIL HCL 10 MG PO TABS
10.0000 mg | ORAL_TABLET | Freq: Every day | ORAL | Status: DC
  Filled 2023-09-02: qty 1

## 2023-09-02 MED ORDER — CEFAZOLIN SODIUM-DEXTROSE 2-4 GM/100ML-% IV SOLN
2.0000 g | Freq: Four times a day (QID) | INTRAVENOUS | Status: AC
  Administered 2023-09-02 (×2): 2 g via INTRAVENOUS
  Filled 2023-09-02: qty 100

## 2023-09-02 MED ORDER — DEXAMETHASONE SODIUM PHOSPHATE 10 MG/ML IJ SOLN
INTRAMUSCULAR | Status: DC | PRN
  Administered 2023-09-02: 10 mg via INTRAVENOUS

## 2023-09-02 MED ORDER — ACETAMINOPHEN 325 MG PO TABS: 325.0000 mg | ORAL_TABLET | Freq: Four times a day (QID) | ORAL | Status: DC | PRN

## 2023-09-02 MED ORDER — ONDANSETRON HCL 4 MG/2ML IJ SOLN
INTRAMUSCULAR | Status: DC | PRN
  Administered 2023-09-02: 4 mg via INTRAVENOUS

## 2023-09-02 MED ORDER — DEXAMETHASONE SODIUM PHOSPHATE 10 MG/ML IJ SOLN
INTRAMUSCULAR | Status: AC
  Filled 2023-09-02: qty 1

## 2023-09-02 MED ORDER — EPHEDRINE 5 MG/ML INJ
INTRAVENOUS | Status: AC
  Filled 2023-09-02: qty 5

## 2023-09-02 MED ORDER — ATORVASTATIN CALCIUM 10 MG PO TABS
10.0000 mg | ORAL_TABLET | Freq: Every day | ORAL | Status: DC
  Administered 2023-09-02 – 2023-09-03 (×2): 10 mg via ORAL
  Filled 2023-09-02 (×2): qty 1

## 2023-09-02 MED ORDER — SODIUM CHLORIDE 0.9 % IV SOLN: INTRAVENOUS | Status: DC

## 2023-09-02 MED ORDER — ORAL CARE MOUTH RINSE: 15.0000 mL | Freq: Once | OROMUCOSAL | Status: AC

## 2023-09-02 MED ORDER — BUPIVACAINE HCL (PF) 0.25 % IJ SOLN
INTRAMUSCULAR | Status: AC
  Filled 2023-09-02: qty 120

## 2023-09-02 MED ORDER — FLEET ENEMA RE ENEM: 1.0000 | ENEMA | Freq: Once | RECTAL | Status: DC | PRN

## 2023-09-02 MED ORDER — ALUM & MAG HYDROXIDE-SIMETH 200-200-20 MG/5ML PO SUSP: 30.0000 mL | ORAL | Status: DC | PRN

## 2023-09-02 MED ORDER — FERROUS SULFATE 325 (65 FE) MG PO TABS
325.0000 mg | ORAL_TABLET | Freq: Two times a day (BID) | ORAL | Status: DC
  Administered 2023-09-02 – 2023-09-03 (×2): 325 mg via ORAL
  Filled 2023-09-02 (×2): qty 1

## 2023-09-02 MED ORDER — CEFAZOLIN SODIUM-DEXTROSE 2-4 GM/100ML-% IV SOLN
INTRAVENOUS | Status: AC
  Filled 2023-09-02: qty 100

## 2023-09-02 MED ORDER — FENTANYL CITRATE (PF) 100 MCG/2ML IJ SOLN
25.0000 ug | INTRAMUSCULAR | Status: DC | PRN
Start: 2023-09-02 — End: 2023-09-02
  Administered 2023-09-02 (×3): 25 ug via INTRAVENOUS

## 2023-09-02 MED ORDER — ASPIRIN 81 MG PO CHEW
81.0000 mg | CHEWABLE_TABLET | Freq: Two times a day (BID) | ORAL | Status: DC
  Administered 2023-09-02 – 2023-09-03 (×2): 81 mg via ORAL
  Filled 2023-09-02 (×2): qty 1

## 2023-09-02 MED ORDER — AMLODIPINE BESYLATE 10 MG PO TABS
5.0000 mg | ORAL_TABLET | Freq: Every day | ORAL | Status: DC
  Administered 2023-09-03: 5 mg via ORAL
  Filled 2023-09-02: qty 1

## 2023-09-02 MED ORDER — PROPOFOL 500 MG/50ML IV EMUL
INTRAVENOUS | Status: DC | PRN
  Administered 2023-09-02: 100 ug/kg/min via INTRAVENOUS

## 2023-09-02 MED ORDER — CELECOXIB 200 MG PO CAPS
ORAL_CAPSULE | ORAL | Status: AC
  Filled 2023-09-02: qty 2

## 2023-09-02 MED ORDER — OXYCODONE HCL 5 MG PO TABS
ORAL_TABLET | ORAL | Status: AC
  Filled 2023-09-02: qty 1

## 2023-09-02 MED ORDER — BISACODYL 10 MG RE SUPP: 10.0000 mg | Freq: Every day | RECTAL | Status: DC | PRN

## 2023-09-02 MED ORDER — ENSURE PRE-SURGERY PO LIQD
296.0000 mL | Freq: Once | ORAL | Status: AC
  Administered 2023-09-02: 296 mL via ORAL
  Filled 2023-09-02: qty 296

## 2023-09-02 MED ORDER — HYDROMORPHONE HCL 1 MG/ML IJ SOLN: 0.5000 mg | INTRAMUSCULAR | Status: DC | PRN

## 2023-09-02 MED ORDER — MIDAZOLAM HCL 2 MG/2ML IJ SOLN
INTRAMUSCULAR | Status: AC
  Filled 2023-09-02: qty 2

## 2023-09-02 MED ORDER — PHENOL 1.4 % MT LIQD: 1.0000 | OROMUCOSAL | Status: DC | PRN

## 2023-09-02 MED ORDER — CHLORHEXIDINE GLUCONATE 4 % EX SOLN
60.0000 mL | Freq: Once | CUTANEOUS | Status: AC
  Administered 2023-09-02: 4 via TOPICAL

## 2023-09-02 MED ORDER — PROPOFOL 10 MG/ML IV BOLUS
INTRAVENOUS | Status: DC | PRN
  Administered 2023-09-02: 80 mg via INTRAVENOUS

## 2023-09-02 MED ORDER — SODIUM CHLORIDE (PF) 0.9 % IJ SOLN
INTRAMUSCULAR | Status: AC
  Filled 2023-09-02: qty 80

## 2023-09-02 MED ORDER — GABAPENTIN 300 MG PO CAPS
ORAL_CAPSULE | ORAL | Status: AC
  Filled 2023-09-02: qty 1

## 2023-09-02 MED ORDER — MAGNESIUM HYDROXIDE 400 MG/5ML PO SUSP
30.0000 mL | Freq: Every day | ORAL | Status: DC
  Administered 2023-09-03: 30 mL via ORAL
  Filled 2023-09-02: qty 30

## 2023-09-02 MED ORDER — LIDOCAINE HCL (PF) 2 % IJ SOLN
INTRAMUSCULAR | Status: AC
  Filled 2023-09-02: qty 5

## 2023-09-02 MED ORDER — TRANEXAMIC ACID-NACL 1000-0.7 MG/100ML-% IV SOLN
1000.0000 mg | Freq: Once | INTRAVENOUS | Status: AC
  Administered 2023-09-02: 1000 mg via INTRAVENOUS

## 2023-09-02 MED ORDER — ACETAMINOPHEN 10 MG/ML IV SOLN
1000.0000 mg | Freq: Four times a day (QID) | INTRAVENOUS | Status: DC
  Administered 2023-09-02 – 2023-09-03 (×3): 1000 mg via INTRAVENOUS
  Filled 2023-09-02 (×2): qty 100

## 2023-09-02 MED ORDER — ACETAMINOPHEN 10 MG/ML IV SOLN
INTRAVENOUS | Status: AC
  Filled 2023-09-02: qty 100

## 2023-09-02 MED ORDER — CELECOXIB 200 MG PO CAPS
400.0000 mg | ORAL_CAPSULE | Freq: Once | ORAL | Status: AC
  Administered 2023-09-02: 400 mg via ORAL

## 2023-09-02 MED ORDER — DIPHENHYDRAMINE HCL 12.5 MG/5ML PO ELIX: 12.5000 mg | ORAL_SOLUTION | ORAL | Status: DC | PRN

## 2023-09-02 MED ORDER — MUPIROCIN 2 % EX OINT
1.0000 | TOPICAL_OINTMENT | Freq: Two times a day (BID) | CUTANEOUS | 0 refills | Status: AC
Start: 2023-09-02 — End: 2023-10-02

## 2023-09-02 MED ORDER — OXYCODONE HCL 5 MG PO TABS: 10.0000 mg | ORAL_TABLET | ORAL | Status: DC | PRN

## 2023-09-02 MED ORDER — BENAZEPRIL HCL 5 MG PO TABS
10.0000 mg | ORAL_TABLET | Freq: Every day | ORAL | Status: DC
  Administered 2023-09-03: 10 mg via ORAL
  Filled 2023-09-02 (×2): qty 2

## 2023-09-02 MED ORDER — PANTOPRAZOLE SODIUM 40 MG PO TBEC
40.0000 mg | DELAYED_RELEASE_TABLET | Freq: Two times a day (BID) | ORAL | Status: DC
  Administered 2023-09-02 – 2023-09-03 (×3): 40 mg via ORAL
  Filled 2023-09-02 (×3): qty 1

## 2023-09-02 MED ORDER — OXYCODONE HCL 5 MG/5ML PO SOLN: 5.0000 mg | Freq: Once | ORAL | Status: AC | PRN

## 2023-09-02 MED ORDER — SODIUM CHLORIDE (PF) 0.9 % IJ SOLN
INTRAMUSCULAR | Status: DC | PRN
  Administered 2023-09-02: 120 mL via INTRAMUSCULAR

## 2023-09-02 MED ORDER — GABAPENTIN 300 MG PO CAPS
300.0000 mg | ORAL_CAPSULE | Freq: Once | ORAL | Status: AC
  Administered 2023-09-02: 300 mg via ORAL

## 2023-09-02 MED ORDER — TRAMADOL HCL 50 MG PO TABS
50.0000 mg | ORAL_TABLET | ORAL | Status: DC | PRN
Start: 2023-09-02 — End: 2023-09-03
  Administered 2023-09-02: 50 mg via ORAL
  Filled 2023-09-02: qty 1

## 2023-09-02 MED ORDER — SURGIPHOR WOUND IRRIGATION SYSTEM - OPTIME: TOPICAL | Status: DC | PRN

## 2023-09-02 MED ORDER — OXYCODONE HCL 5 MG PO TABS: 5.0000 mg | ORAL_TABLET | ORAL | Status: DC | PRN

## 2023-09-02 MED ORDER — ONDANSETRON HCL 4 MG/2ML IJ SOLN: 4.0000 mg | Freq: Four times a day (QID) | INTRAMUSCULAR | Status: DC | PRN

## 2023-09-02 MED ORDER — CHLORHEXIDINE GLUCONATE 0.12 % MT SOLN
15.0000 mL | Freq: Once | OROMUCOSAL | Status: AC
  Administered 2023-09-02: 15 mL via OROMUCOSAL

## 2023-09-02 MED ORDER — CHLORHEXIDINE GLUCONATE 4 % EX SOLN
1.0000 | CUTANEOUS | 1 refills | Status: AC
Start: 2023-09-02 — End: ?

## 2023-09-02 SURGICAL SUPPLY — 67 items
ATTUNE PSFEM RTSZ5 NARCEM KNEE (Femur) IMPLANT
ATTUNE PSRP INSE SZ5 7 KNEE (Insert) IMPLANT
BASE TIBIAL ROT PLAT SZ 5 KNEE (Knees) IMPLANT
BATTERY INSTRU NAVIGATION (MISCELLANEOUS) ×4 IMPLANT
BIT DRILL QUICK REL 1/8 2PK SL (BIT) ×1 IMPLANT
BLADE CLIPPER SURG (BLADE) IMPLANT
BLADE SAW 70X12.5 (BLADE) ×1 IMPLANT
BLADE SAW 90X13X1.19 OSCILLAT (BLADE) ×1 IMPLANT
BLADE SAW 90X25X1.19 OSCILLAT (BLADE) ×1 IMPLANT
BONE CEMENT GENTAMICIN (Cement) ×2 IMPLANT
BRUSH SCRUB EZ PLAIN DRY (MISCELLANEOUS) ×1 IMPLANT
CEMENT BONE GENTAMICIN 40 (Cement) IMPLANT
COOLER POLAR GLACIER W/PUMP (MISCELLANEOUS) ×1 IMPLANT
CUFF TRNQT CYL 24X4X16.5-23 (TOURNIQUET CUFF) IMPLANT
CUFF TRNQT CYL 30X4X21-28X (TOURNIQUET CUFF) IMPLANT
DRAPE SHEET LG 3/4 BI-LAMINATE (DRAPES) ×1 IMPLANT
DRSG AQUACEL AG ADV 3.5X14 (GAUZE/BANDAGES/DRESSINGS) ×1 IMPLANT
DRSG MEPILEX SACRM 8.7X9.8 (GAUZE/BANDAGES/DRESSINGS) ×1 IMPLANT
DRSG TEGADERM 4X4.75 (GAUZE/BANDAGES/DRESSINGS) ×1 IMPLANT
DURAPREP 26ML APPLICATOR (WOUND CARE) ×2 IMPLANT
ELECT CAUTERY BLADE 6.4 (BLADE) ×1 IMPLANT
ELECT REM PT RETURN 9FT ADLT (ELECTROSURGICAL) ×1 IMPLANT
ELECTRODE REM PT RTRN 9FT ADLT (ELECTROSURGICAL) ×1 IMPLANT
EVACUATOR 1/8 PVC DRAIN (DRAIN) ×1 IMPLANT
EX-PIN ORTHOLOCK NAV 4X150 (PIN) ×2 IMPLANT
GAUZE XEROFORM 1X8 LF (GAUZE/BANDAGES/DRESSINGS) ×1 IMPLANT
GLOVE BIOGEL M STRL SZ7.5 (GLOVE) ×6 IMPLANT
GLOVE SURG UNDER POLY LF SZ8 (GLOVE) ×2 IMPLANT
GOWN STRL REUS W/ TWL LRG LVL3 (GOWN DISPOSABLE) ×1 IMPLANT
GOWN STRL REUS W/ TWL XL LVL3 (GOWN DISPOSABLE) ×1 IMPLANT
GOWN TOGA ZIPPER T7+ PEEL AWAY (MISCELLANEOUS) ×1 IMPLANT
HOLDER FOLEY CATH W/STRAP (MISCELLANEOUS) ×1 IMPLANT
HOOD PEEL AWAY T7 (MISCELLANEOUS) ×1 IMPLANT
KIT TURNOVER KIT A (KITS) ×1 IMPLANT
KNIFE SCULPS 14X20 (INSTRUMENTS) ×1 IMPLANT
MANIFOLD NEPTUNE II (INSTRUMENTS) ×2 IMPLANT
NDL SPNL 20GX3.5 QUINCKE YW (NEEDLE) ×2 IMPLANT
NEEDLE SPNL 20GX3.5 QUINCKE YW (NEEDLE) ×2 IMPLANT
PACK TOTAL KNEE (MISCELLANEOUS) ×1 IMPLANT
PAD ABD DERMACEA PRESS 5X9 (GAUZE/BANDAGES/DRESSINGS) ×2 IMPLANT
PAD ARMBOARD POSITIONER FOAM (MISCELLANEOUS) ×3 IMPLANT
PAD WRAPON POLAR KNEE (MISCELLANEOUS) ×1 IMPLANT
PATELLA MEDIAL ATTUN 35MM KNEE (Knees) IMPLANT
PENCIL SMOKE EVACUATOR COATED (MISCELLANEOUS) ×1 IMPLANT
PIN DRILL FIX HALF THREAD (BIT) ×2 IMPLANT
PIN FIXATION 1/8DIA X 3INL (PIN) ×1 IMPLANT
PULSAVAC PLUS IRRIG FAN TIP (DISPOSABLE) ×1 IMPLANT
SOL .9 NS 3000ML IRR UROMATIC (IV SOLUTION) ×1 IMPLANT
SOLUTION IRRIG SURGIPHOR (IV SOLUTION) ×1 IMPLANT
SPONGE DRAIN TRACH 4X4 STRL 2S (GAUZE/BANDAGES/DRESSINGS) ×1 IMPLANT
STAPLER SKIN PROX 35W (STAPLE) ×1 IMPLANT
STOCKINETTE IMPERV 14X48 (MISCELLANEOUS) ×1 IMPLANT
STOCKINETTE STRL BIAS CUT 8X4 (MISCELLANEOUS) ×1 IMPLANT
STRAP TIBIA SHORT (MISCELLANEOUS) ×1 IMPLANT
SUCTION TUBE FRAZIER 10FR DISP (SUCTIONS) ×1 IMPLANT
SUT VIC AB 0 CT1 36 (SUTURE) ×1 IMPLANT
SUT VIC AB 1 CT1 36 (SUTURE) ×2 IMPLANT
SUT VIC AB 2-0 CT2 27 (SUTURE) ×1 IMPLANT
SYR 30ML LL (SYRINGE) ×2 IMPLANT
TIBIAL BASE ROT PLAT SZ 5 KNEE (Knees) ×1 IMPLANT
TIP FAN IRRIG PULSAVAC PLUS (DISPOSABLE) ×1 IMPLANT
TOWEL OR 17X26 4PK STRL BLUE (TOWEL DISPOSABLE) ×1 IMPLANT
TOWER CARTRIDGE SMART MIX (DISPOSABLE) ×1 IMPLANT
TRAP FLUID SMOKE EVACUATOR (MISCELLANEOUS) ×1 IMPLANT
TRAY FOLEY MTR SLVR 16FR STAT (SET/KITS/TRAYS/PACK) ×1 IMPLANT
WATER STERILE IRR 1000ML POUR (IV SOLUTION) ×1 IMPLANT
WRAPON POLAR PAD KNEE (MISCELLANEOUS) ×1 IMPLANT

## 2023-09-02 NOTE — Evaluation (Signed)
 Physical Therapy Evaluation Patient Details Name: Shannon Blake MRN: [id number] DOB: Feb 20, 1956 Today's Date: 09/02/2023  History of Present Illness  Pt is s/p R TKA on 09/02/23.  Pt has PMH including: arthritis, hyperlipidemia, HTN, and L TKA.  Clinical Impression  Pt received in Semi-Fowler's position and agreeable to therapy.  Pt noted to be doing well and with minimal pain of the R knee.  Pt performed bed mobility and transfers without any complication.  Upon standing, pt was slightly dizzy, but it resolved fairly quickly.  Pt ready to begin ambulation and had no complications with gait as well.  Pt performed stair training and with visual cues initially, pt had good carryover and pt's family watched for proper performance.  Pt then ambulated back to the room and was assisted back in bed with all needs met and call bell within reach.     Pt likely to be given HEP in the AM, go over those exercises and then ambulate again.        If plan is discharge home, recommend the following: A little help with walking and/or transfers;A little help with bathing/dressing/bathroom;Help with stairs or ramp for entrance;Assist for transportation   Can travel by private vehicle        Equipment Recommendations None recommended by PT  Recommendations for Other Services       Functional Status Assessment Patient has had a recent decline in their functional status and demonstrates the ability to make significant improvements in function in a reasonable and predictable amount of time.     Precautions / Restrictions Restrictions Weight Bearing Restrictions Per Provider Order: Yes RLE Weight Bearing Per Provider Order: Weight bearing as tolerated      Mobility  Bed Mobility Overal bed mobility: Modified Independent                  Transfers Overall transfer level: Needs assistance Equipment used: Rolling walker (2 wheels) Transfers: Sit to/from Stand Sit to Stand: Supervision, Contact  guard assist           General transfer comment: CGA transitioning to supervision    Ambulation/Gait Ambulation/Gait assistance: Contact guard assist, Supervision Gait Distance (Feet): 200 Feet Assistive device: Rolling walker (2 wheels) Gait Pattern/deviations: Step-through pattern Gait velocity: WFL     General Gait Details: Pt with good gait pattern with walker and adequate gait speed.  Stairs Stairs: Yes Stairs assistance: Contact guard assist Stair Management: No rails, Step to pattern, Backwards, With walker Number of Stairs: 4 General stair comments: Pt has railing on the L side at home, so pt performed stair training backwards and with good technique and carryover.  Wheelchair Mobility     Tilt Bed    Modified Rankin (Stroke Patients Only)       Balance Overall balance assessment: Modified Independent                                           Pertinent Vitals/Pain Pain Assessment Pain Assessment: 0-10 Pain Score: 2  Pain Location: R Knee Pain Intervention(s): Limited activity within patient's tolerance, Monitored during session, Premedicated before session, Repositioned    Home Living Family/patient expects to be discharged to:: Private residence Living Arrangements: Spouse/significant other Available Help at Discharge: Family;Available 24 hours/day Type of Home: House Home Access: Stairs to enter Entrance Stairs-Rails: Left Entrance Stairs-Number of Steps: 3   Home Layout:  One level Home Equipment: Agricultural consultant (2 wheels);Cane - single point;BSC/3in1;Shower seat - built in;Grab bars - toilet;Grab bars - tub/shower      Prior Function Prior Level of Function : Independent/Modified Independent                     Extremity/Trunk Assessment   Upper Extremity Assessment Upper Extremity Assessment: Overall WFL for tasks assessed    Lower Extremity Assessment Lower Extremity Assessment: RLE deficits/detail RLE  Deficits / Details: weakness s/p surgical intervention.       Communication        Cognition Arousal: Alert Behavior During Therapy: WFL for tasks assessed/performed   PT - Cognitive impairments: No apparent impairments                                 Cueing       General Comments      Exercises     Assessment/Plan    PT Assessment Patient needs continued PT services  PT Problem List Decreased strength;Decreased range of motion;Decreased activity tolerance;Decreased balance;Decreased mobility;Obesity       PT Treatment Interventions DME instruction;Gait training;Stair training;Functional mobility training;Therapeutic activities;Therapeutic exercise;Balance training;Neuromuscular re-education    PT Goals (Current goals can be found in the Care Plan section)  Acute Rehab PT Goals Patient Stated Goal: to go home and get better. PT Goal Formulation: With patient Time For Goal Achievement: 09/16/23 Potential to Achieve Goals: Good    Frequency BID     Co-evaluation               AM-PAC PT "6 Clicks" Mobility  Outcome Measure Help needed turning from your back to your side while in a flat bed without using bedrails?: A Little Help needed moving from lying on your back to sitting on the side of a flat bed without using bedrails?: A Little Help needed moving to and from a bed to a chair (including a wheelchair)?: A Little Help needed standing up from a chair using your arms (e.g., wheelchair or bedside chair)?: A Little Help needed to walk in hospital room?: A Little Help needed climbing 3-5 steps with a railing? : A Little 6 Click Score: 18    End of Session Equipment Utilized During Treatment: Gait belt Activity Tolerance: Patient tolerated treatment well Patient left: in bed;with call bell/phone within reach;with bed alarm set;with family/visitor present;with nursing/sitter in room Nurse Communication: Mobility status PT Visit Diagnosis:  Muscle weakness (generalized) (M62.81);Difficulty in walking, not elsewhere classified (R26.2);Dizziness and giddiness (R42);Pain Pain - Right/Left: Right Pain - part of body: Knee    Time: 8295-6213 PT Time Calculation (min) (ACUTE ONLY): 32 min   Charges:   PT Evaluation $PT Eval Moderate Complexity: 1 Mod PT Treatments $Therapeutic Activity: 8-22 mins PT General Charges $$ ACUTE PT VISIT: 1 Visit         Nolon Bussing, PT, DPT Physical Therapist - [facility]  09/02/23, 4:42 PM

## 2023-09-02 NOTE — Interval H&P Note (Signed)
 History and Physical Interval Note:  09/02/2023 6:09 AM  Shannon Blake  has presented today for surgery, with the diagnosis of PRIMARY OSTEOARTHRITIS OF RIGHT KNEE..  The various methods of treatment have been discussed with the patient and family. After consideration of risks, benefits and other options for treatment, the patient has consented to  Procedure(s): ARTHROPLASTY, KNEE, TOTAL, USING IMAGELESS COMPUTER-ASSISTED NAVIGATION (Right) as a surgical intervention.  The patient's history has been reviewed, patient examined, no change in status, stable for surgery.  I have reviewed the patient's chart and labs.  Questions were answered to the patient's satisfaction.     Rowland Ericsson P Daysean Tinkham

## 2023-09-02 NOTE — Transfer of Care (Signed)
 Immediate Anesthesia Transfer of Care Note  Patient: Shannon Blake  Procedure(s) Performed: ARTHROPLASTY, KNEE, TOTAL, USING IMAGELESS COMPUTER-ASSISTED NAVIGATION (Right: Knee)  Patient Location: PACU  Anesthesia Type:Spinal  Level of Consciousness: sedated  Airway & Oxygen Therapy: Patient Spontanous Breathing and Patient connected to face mask oxygen  Post-op Assessment: Report given to RN and Post -op Vital signs reviewed and stable  Post vital signs: Reviewed and stable  Last Vitals:  Vitals Value Taken Time  BP 100/66 09/02/23 1132  Temp 36.9 C 09/02/23 1137  Pulse 78 09/02/23 1137  Resp 17 09/02/23 1137  SpO2 91 % 09/02/23 1137  Vitals shown include unfiled device data.  Last Pain:  Vitals:   09/02/23 1137  TempSrc: Temporal  PainSc:          Complications: No notable events documented.

## 2023-09-02 NOTE — Op Note (Signed)
 OPERATIVE NOTE  DATE OF SURGERY:  09/02/2023  PATIENT NAME:  Shannon Blake   DOB: January 23, 1956  MRN: [id number]  PRE-OPERATIVE DIAGNOSIS: Degenerative arthrosis of the right knee, primary  POST-OPERATIVE DIAGNOSIS:  Same  PROCEDURE:  Right total knee arthroplasty using computer-assisted navigation  SURGEON:  Jena Gauss. M.D.  ASSISTANT:  Gean Birchwood, PA-C (present and scrubbed throughout the case, critical for assistance with exposure, retraction, instrumentation, and closure)  ANESTHESIA: spinal  ESTIMATED BLOOD LOSS: 50 mL  FLUIDS REPLACED: 1100 mL of crystalloid  TOURNIQUET TIME: 104 minutes  DRAINS: 2 medium Hemovac drains  SOFT TISSUE RELEASES: Anterior cruciate ligament, posterior cruciate ligament, deep medial collateral ligament, patellofemoral ligament  IMPLANTS UTILIZED: DePuy Attune size 5N posterior stabilized femoral component (cemented), size 5 rotating platform tibial component (cemented), 35 mm medialized dome patella (cemented), and a 7 mm stabilized rotating platform polyethylene insert.  INDICATIONS FOR SURGERY: Shannon Blake is a 68 y.o. year old female with a long history of progressive knee pain. X-rays demonstrated severe degenerative changes in tricompartmental fashion. The patient had not seen any significant improvement despite conservative nonsurgical intervention. After discussion of the risks and benefits of surgical intervention, the patient expressed understanding of the risks benefits and agree with plans for total knee arthroplasty.   The risks, benefits, and alternatives were discussed at length including but not limited to the risks of infection, bleeding, nerve injury, stiffness, blood clots, the need for revision surgery, cardiopulmonary complications, among others, and they were willing to proceed.  PROCEDURE IN DETAIL: The patient was brought into the operating room and, after adequate spinal anesthesia was achieved, a tourniquet was placed  on the patient's upper thigh. The patient's knee and leg were cleaned and prepped with alcohol and DuraPrep and draped in the usual sterile fashion. A "timeout" was performed as per usual protocol. The lower extremity was exsanguinated using an Esmarch, and the tourniquet was inflated to 300 mmHg. An anterior longitudinal incision was made followed by a standard mid vastus approach. The deep fibers of the medial collateral ligament were elevated in a subperiosteal fashion off of the medial flare of the tibia so as to maintain a continuous soft tissue sleeve. The patella was subluxed laterally and the patellofemoral ligament was incised. Inspection of the knee demonstrated severe degenerative changes with full-thickness loss of articular cartilage. Osteophytes were debrided using a rongeur. Anterior and posterior cruciate ligaments were excised. Two 4.0 mm Schanz pins were inserted in the femur and into the tibia for attachment of the array of trackers used for computer-assisted navigation. Hip center was identified using a circumduction technique. Distal landmarks were mapped using the computer. The distal femur and proximal tibia were mapped using the computer. The distal femoral cutting guide was positioned using computer-assisted navigation so as to achieve a 5 distal valgus cut. The femur was sized and it was felt that a size 5N femoral component was appropriate. A size 5 femoral cutting guide was positioned and the anterior cut was performed and verified using the computer. This was followed by completion of the posterior and chamfer cuts. Femoral cutting guide for the central box was then positioned in the center box cut was performed.  Attention was then directed to the proximal tibia. Medial and lateral menisci were excised. The extramedullary tibial cutting guide was positioned using computer-assisted navigation so as to achieve a 0 varus-valgus alignment and 3 posterior slope. The cut was performed  and verified using the computer. The proximal tibia  was sized and it was felt that a size 5 tibial tray was appropriate. Tibial and femoral trials were inserted followed by insertion of a 7 mm polyethylene insert. This allowed for excellent mediolateral soft tissue balancing both in flexion and in full extension. Finally, the patella was cut and prepared so as to accommodate a 35 mm medialized dome patella. A patella trial was placed and the knee was placed through a range of motion with excellent patellar tracking appreciated. The femoral trial was removed after debridement of posterior osteophytes. The central post-hole for the tibial component was reamed followed by insertion of a keel punch. Tibial trials were then removed. Cut surfaces of bone were irrigated with copious amounts of normal saline using pulsatile lavage and then suctioned dry. Polymethylmethacrylate cement with gentamicin was prepared in the usual fashion using a vacuum mixer. Cement was applied to the cut surface of the proximal tibia as well as along the undersurface of a size 5 rotating platform tibial component. Tibial component was positioned and impacted into place. Excess cement was removed using Personal assistant. Cement was then applied to the cut surfaces of the femur as well as along the posterior flanges of the size 5N femoral component. The femoral component was positioned and impacted into place. Excess cement was removed using Personal assistant. A 7 mm polyethylene trial was inserted and the knee was brought into full extension with steady axial compression applied. Finally, cement was applied to the backside of a 35 mm medialized dome patella and the patellar component was positioned and patellar clamp applied. Excess cement was removed using Personal assistant. After adequate curing of the cement, the tourniquet was deflated after a total tourniquet time of 104 minutes. Hemostasis was achieved using electrocautery. The knee was  irrigated with copious amounts of normal saline using pulsatile lavage followed by 450 ml of Surgiphor and then suctioned dry. 20 mL of 1.3% Exparel and 60 mL of 0.25% Marcaine in 40 mL of normal saline was injected along the posterior capsule, medial and lateral gutters, and along the arthrotomy site. A 7 mm stabilized rotating platform polyethylene insert was inserted and the knee was placed through a range of motion with excellent mediolateral soft tissue balancing appreciated and excellent patellar tracking noted. 2 medium drains were placed in the wound bed and brought out through separate stab incisions. The medial parapatellar portion of the incision was reapproximated using interrupted sutures of #1 Vicryl. Subcutaneous tissue was approximated in layers using first #0 Vicryl followed #2-0 Vicryl. The skin was approximated with skin staples. A sterile dressing was applied.  The patient tolerated the procedure well and was transported to the recovery room in stable condition.    Trew Sunde P. Angie Fava., M.D.

## 2023-09-02 NOTE — TOC Initial Note (Signed)
 Transition of Care [facility]) - Initial/Assessment Note    Patient Details  Name: Shannon Blake MRN: [id number] Date of Birth: Jun 29, 1955  Transition of Care [facility]) CM/SW Contact:    Marlowe Sax, RN Phone Number: 09/02/2023, 9:05 AM  Clinical Narrative:                  [facility] is set up prior to surgery by surgeons office for home health services       Patient Goals and CMS Choice            Expected Discharge Plan and Services                                              Prior Living Arrangements/Services                       Activities of Daily Living      Permission Sought/Granted                  Emotional Assessment              Admission diagnosis:  Primary osteoarthritis of left knee [M17.12] Patient Active Problem List   Diagnosis Date Noted   BMI 40.0-44.9, adult (HCC) 06/27/2019   Familial hypercholesterolemia 06/27/2019   Special screening for malignant neoplasms, colon    Polyp of sigmoid colon    Family history of peritoneal cancer 07/20/2017   Status post total left knee replacement 08/27/2015   Calculus of kidney 10/17/2014   Alimentary obesity 10/17/2014   Cough 10/17/2014   Essential (primary) hypertension 10/17/2014   Combined fat and carbohydrate induced hyperlipemia 10/17/2014   Derangement of knee 12/01/2013   Arthritis of knee, degenerative 12/01/2013   PCP:  Duanne Limerick, MD Pharmacy:   [facility] [phone] - [place], [place] - [address] [place] [address] Phone: [phone] Fax: [phone]     Social Drivers of Health (SDOH) Social History: SDOH Screenings   Food Insecurity: No Food Insecurity (07/09/2023)   Received from [facility]  Housing: Low Risk  (07/09/2023)   Received from [facility]  Transportation Needs: No Transportation Needs (07/09/2023)   Received from [facility]  Utilities: Not At Risk (07/09/2023)    Received from [facility]  Alcohol Screen: Low Risk  (09/11/2022)  Depression (PHQ2-9): Low Risk  (05/22/2023)  Financial Resource Strain: Low Risk  (07/09/2023)   Received from [facility]  Physical Activity: Insufficiently Active (09/11/2022)  Social Connections: Moderately Isolated (09/11/2022)  Stress: No Stress Concern Present (09/11/2022)  Tobacco Use: Low Risk  (09/02/2023)   SDOH Interventions:     Readmission Risk Interventions     No data to display

## 2023-09-02 NOTE — Progress Notes (Signed)
 Subjective: 1 Day Post-Op Procedure(s) (LRB): ARTHROPLASTY, KNEE, TOTAL, USING IMAGELESS COMPUTER-ASSISTED NAVIGATION (Right) Patient reports pain as mild.   Patient seen in rounds with Dr. Ernest Pine. Patient is well, and has had no acute complaints or problems. Denies any CP, SOB, N/V, fevers or chills We will start therapy today.  Plan is to go Home after hospital stay.  Objective: Vital signs in last 24 hours: Temp:  [97 F (36.1 C)-98.4 F (36.9 C)] 98.1 F (36.7 C) (03/27 0726) Pulse Rate:  [57-79] 59 (03/27 0726) Resp:  [10-20] 15 (03/27 0726) BP: (109-138)/(62-76) 138/68 (03/27 0726) SpO2:  [87 %-100 %] 96 % (03/27 0726)  Intake/Output from previous day:  Intake/Output Summary (Last 24 hours) at 09/03/2023 0813 Last data filed at 09/03/2023 0416 Gross per 24 hour  Intake 1855.12 ml  Output 800 ml  Net 1055.12 ml    Intake/Output this shift: No intake/output data recorded.  Labs: No results for input(s): "HGB" in the last 72 hours. No results for input(s): "WBC", "RBC", "HCT", "PLT" in the last 72 hours. No results for input(s): "NA", "K", "CL", "CO2", "BUN", "CREATININE", "GLUCOSE", "CALCIUM" in the last 72 hours. No results for input(s): "LABPT", "INR" in the last 72 hours.  EXAM General - Patient is Alert, Appropriate, and Oriented Extremity - Neurologically intact Neurovascular intact Sensation intact distally Intact pulses distally Dorsiflexion/Plantar flexion intact No cellulitis present Compartment soft Dressing - dressing C/D/I and no drainage Motor Function - intact, moving foot and toes well on exam.  JP Drain pulled without difficulty. Intact  Past Medical History:  Diagnosis Date   Alimentary obesity 10/17/2014   Arthritis    Calculus of kidney 10/17/2014   Combined fat and carbohydrate induced hyperlipemia 10/17/2014   Derangement of knee 12/01/2013   Essential (primary) hypertension 10/17/2014   Family history of ovarian cancer    Pt's  affected sister is MyRisk neg   Hypertension    Polyp of sigmoid colon     Assessment/Plan: 1 Day Post-Op Procedure(s) (LRB): ARTHROPLASTY, KNEE, TOTAL, USING IMAGELESS COMPUTER-ASSISTED NAVIGATION (Right) Principal Problem:   History of total knee arthroplasty, right  Estimated body mass index is 41.54 kg/m as calculated from the following:   Height as of this encounter: 5\' 2"  (1.575 m).   Weight as of this encounter: 103 kg. Advance diet Up with therapy  Patient will continue to work with physical therapy to pass postoperative PT protocols, ROM and strengthening  Discussed with the patient continuing to utilize Polar Care  Patient will use bone foam in 20-30 minute intervals  Patient will wear TED hose bilaterally to help prevent DVT and clot formation  Discussed the Aquacel bandage.  This bandage will stay in place 7 days postoperatively.  Can be replaced with honeycomb bandages that will be sent home with the patient  Discussed sending the patient home with tramadol and oxycodone for as needed pain management.  Patient will also be sent home with Celebrex to help with swelling and inflammation.  Patient will take an 81 mg aspirin twice daily for DVT prophylaxis  JP drain removed without difficulty, intact  Weight-Bearing as tolerated to right3 leg  Patient will follow-up with [facility] clinic orthopedics in 2 weeks for staple removal and reevaluation  Rayburn Go, PA-C [facility] 09/03/2023, 8:13 AM

## 2023-09-02 NOTE — Progress Notes (Signed)
 Patient is not able to walk the distance required to go the bathroom, or he/she is unable to safely negotiate stairs required to access the bathroom.  A 3in1 BSC will alleviate this problem   Amenda Duclos P. Angie Fava M.D.

## 2023-09-02 NOTE — Discharge Summary (Signed)
 Physician Discharge Summary  Subjective: 1 Day Post-Op Procedure(s) (LRB): ARTHROPLASTY, KNEE, TOTAL, USING IMAGELESS COMPUTER-ASSISTED NAVIGATION (Right) Patient reports pain as mild.   Patient seen in rounds with Dr. Ernest Pine. Patient is well, and has had no acute complaints or problems. Denies any CP, SOB, N/V, fevers or chills We will start therapy today.  Patient is ready to go home  Physician Discharge Summary  Patient ID: Shannon Blake MRN: [id number] DOB/AGE: 13-Feb-1956 68 y.o.  Admit date: 09/02/2023 Discharge date: 09/03/2023  Admission Diagnoses:  Discharge Diagnoses:  Principal Problem:   History of total knee arthroplasty, right   Discharged Condition: good  Hospital Course: Patient presented to the hospital on 09/02/2023 for an elective right total knee arthroplasty performed by Dr. Ernest Pine. Patient was given 1g of TXA and 2g of Ancef prior to the procedure. she tolerated the procedure well without any complications. See procedural note below for details. Postoperatively, the patient did very well. she was able to pass PT protocols on post-op day one without any issues. JP drain was removed without any difficulty and was intact. she was able to void her bladder without any difficulty. Physical exam was unremarkable. she denies any SOB, CP, N/V, fevers or chills. Vital signs are stable. Patient is stable to discharge home.  PROCEDURE:  Right total knee arthroplasty using computer-assisted navigation   SURGEON:  Jena Gauss. M.D.   ASSISTANT:  Gean Birchwood, PA-C (present and scrubbed throughout the case, critical for assistance with exposure, retraction, instrumentation, and closure)   ANESTHESIA: spinal   ESTIMATED BLOOD LOSS: 50 mL   FLUIDS REPLACED: 1100 mL of crystalloid   TOURNIQUET TIME: 104 minutes   DRAINS: 2 medium Hemovac drains   SOFT TISSUE RELEASES: Anterior cruciate ligament, posterior cruciate ligament, deep medial collateral ligament,  patellofemoral ligament   IMPLANTS UTILIZED: DePuy Attune size 5N posterior stabilized femoral component (cemented), size 5 rotating platform tibial component (cemented), 35 mm medialized dome patella (cemented), and a 7 mm stabilized rotating platform polyethylene insert.  Treatments: none  Discharge Exam: Blood pressure 138/68, pulse (!) 59, temperature 98.1 F (36.7 C), temperature source Oral, resp. rate 15, height 5\' 2"  (1.575 m), weight 103 kg, SpO2 96%.   Disposition: home   Allergies as of 09/03/2023   No Known Allergies      Medication List     TAKE these medications    acetaminophen 500 MG tablet Commonly known as: TYLENOL Take 500 mg by mouth every 6 (six) hours as needed for moderate pain (pain score 4-6).   amLODipine-benazepril 5-10 MG capsule Commonly known as: LOTREL TAKE 1 CAPSULE BY MOUTH EVERY DAY   aspirin 81 MG chewable tablet Chew 1 tablet (81 mg total) by mouth 2 (two) times daily. What changed: when to take this   atorvastatin 10 MG tablet Commonly known as: LIPITOR Take 1 tablet (10 mg total) by mouth daily.   CALCIUM 600 + D PO Take 1 tablet by mouth daily.   celecoxib 200 MG capsule Commonly known as: CELEBREX Take 1 capsule (200 mg total) by mouth 2 (two) times daily.   chlorhexidine 4 % external liquid Commonly known as: HIBICLENS Apply 15 mLs (1 Application total) topically as directed for 30 doses. Use as directed daily for 5 days every other week for 6 weeks.   multivitamin with minerals tablet Take 1 tablet by mouth daily.   mupirocin ointment 2 % Commonly known as: BACTROBAN Place 1 Application into the nose 2 (two) times daily  for 60 doses. Use as directed 2 times daily for 5 days every other week for 6 weeks.   oxyCODONE 5 MG immediate release tablet Commonly known as: Oxy IR/ROXICODONE Take 1 tablet (5 mg total) by mouth every 4 (four) hours as needed for moderate pain (pain score 4-6) (pain score 4-6).   traMADol 50 MG  tablet Commonly known as: ULTRAM Take 1-2 tablets (50-100 mg total) by mouth every 4 (four) hours as needed for moderate pain (pain score 4-6).   TURMERIC CURCUMIN PO Take 1 capsule by mouth daily.               Durable Medical Equipment  (From admission, onward)           Start     Ordered   09/02/23 1122  DME Walker rolling  Once       Question:  Patient needs a walker to treat with the following condition  Answer:  Total knee replacement status   09/02/23 1121   09/02/23 1122  DME Bedside commode  Once       Comments: Patient is not able to walk the distance required to go the bathroom, or he/she is unable to safely negotiate stairs required to access the bathroom.  A 3in1 BSC will alleviate this problem  Question:  Patient needs a bedside commode to treat with the following condition  Answer:  Total knee replacement status   09/02/23 1121            Follow-up Information     Rayburn Go, PA-C Follow up on 09/16/2023.   Specialty: Orthopedic Surgery Why: at 10:30am Contact information: [address] [place] [address] [phone]         Donato Heinz, MD Follow up on 10/15/2023.   Specialty: Orthopedic Surgery Why: at 2:30pm Contact information: [address] [facility] [place] [address] [phone]                 Signed: Gean Birchwood 09/03/2023, 8:13 AM   Objective: Vital signs in last 24 hours: Temp:  [97 F (36.1 C)-98.4 F (36.9 C)] 98.1 F (36.7 C) (03/27 0726) Pulse Rate:  [57-79] 59 (03/27 0726) Resp:  [10-20] 15 (03/27 0726) BP: (109-138)/(62-76) 138/68 (03/27 0726) SpO2:  [87 %-100 %] 96 % (03/27 0726)  Intake/Output from previous day:  Intake/Output Summary (Last 24 hours) at 09/03/2023 0813 Last data filed at 09/03/2023 0416 Gross per 24 hour  Intake 1855.12 ml  Output 800 ml  Net 1055.12 ml    Intake/Output this shift: No intake/output data recorded.  Labs: No results for  input(s): "HGB" in the last 72 hours. No results for input(s): "WBC", "RBC", "HCT", "PLT" in the last 72 hours. No results for input(s): "NA", "K", "CL", "CO2", "BUN", "CREATININE", "GLUCOSE", "CALCIUM" in the last 72 hours. No results for input(s): "LABPT", "INR" in the last 72 hours.  EXAM: General - Patient is Alert, Appropriate, and Oriented Extremity - Neurologically intact Neurovascular intact Sensation intact distally Intact pulses distally Dorsiflexion/Plantar flexion intact No cellulitis present Compartment soft Dressing - dressing C/D/I and no drainage Motor Function - intact, moving foot and toes well on exam.  JP Drain pulled without difficulty. Intact  Assessment/Plan: 1 Day Post-Op Procedure(s) (LRB): ARTHROPLASTY, KNEE, TOTAL, USING IMAGELESS COMPUTER-ASSISTED NAVIGATION (Right) Procedure(s) (LRB): ARTHROPLASTY, KNEE, TOTAL, USING IMAGELESS COMPUTER-ASSISTED NAVIGATION (Right) Past Medical History:  Diagnosis Date   Alimentary obesity 10/17/2014   Arthritis    Calculus of kidney  10/17/2014   Combined fat and carbohydrate induced hyperlipemia 10/17/2014   Derangement of knee 12/01/2013   Essential (primary) hypertension 10/17/2014   Family history of ovarian cancer    Pt's affected sister is MyRisk neg   Hypertension    Polyp of sigmoid colon    Principal Problem:   History of total knee arthroplasty, right  Estimated body mass index is 41.54 kg/m as calculated from the following:   Height as of this encounter: 5\' 2"  (1.575 m).   Weight as of this encounter: 103 kg.   Patient will continue to work with physical therapy on gait, ROM and strengthening   Discussed with the patient continuing to utilize Polar Care   Patient will use bone foam in 20-30 minute intervals   Patient will wear TED hose bilaterally to help prevent DVT and clot formation   Discussed the Aquacel bandage.  This bandage will stay in place 7 days postoperatively.  Can be replaced  with honeycomb bandages that will be sent home with the patient   Discussed sending the patient home with tramadol and oxycodone for as needed pain management.  Patient will also be sent home with Celebrex to help with swelling and inflammation.  Patient will take an 81 mg aspirin twice daily for DVT prophylaxis   JP drain removed without difficulty, intact   Weight-Bearing as tolerated to right3 leg   Patient will follow-up with [facility] orthopedics in 2 weeks for staple removal and reevaluation  Diet - Regular diet Follow up - in 2 weeks Activity - WBAT Disposition - Home Condition Upon Discharge - Good DVT Prophylaxis - Aspirin and TED hose  Danise Edge, PA-C Orthopaedic Surgery 09/03/2023, 8:13 AM

## 2023-09-02 NOTE — Anesthesia Preprocedure Evaluation (Signed)
 Anesthesia Evaluation  Patient identified by MRN, date of birth, ID band Patient awake    Reviewed: Allergy & Precautions, NPO status , Patient's Chart, lab work & pertinent test results  History of Anesthesia Complications Negative for: history of anesthetic complications  Airway Mallampati: III  TM Distance: <3 FB Neck ROM: full    Dental  (+) Chipped   Pulmonary neg pulmonary ROS, neg shortness of breath   Pulmonary exam normal        Cardiovascular Exercise Tolerance: Good hypertension, (-) angina (-) Past MI and (-) DOE negative cardio ROS Normal cardiovascular exam     Neuro/Psych negative neurological ROS  negative psych ROS   GI/Hepatic negative GI ROS, Neg liver ROS,,,  Endo/Other  negative endocrine ROS    Renal/GU Renal disease     Musculoskeletal   Abdominal   Peds  Hematology negative hematology ROS (+)   Anesthesia Other Findings Past Medical History: 10/17/2014: Alimentary obesity No date: Arthritis 10/17/2014: Calculus of kidney 10/17/2014: Combined fat and carbohydrate induced hyperlipemia 12/01/2013: Derangement of knee 10/17/2014: Essential (primary) hypertension No date: Family history of ovarian cancer     Comment:  Pt's affected sister is MyRisk neg No date: Hypertension No date: Polyp of sigmoid colon  Past Surgical History: No date: CHOLECYSTECTOMY 2007: COLONOSCOPY     Comment:  Dr Bluford Kaufmann 08/11/2017: COLONOSCOPY WITH PROPOFOL; N/A     Comment:  Procedure: COLONOSCOPY WITH PROPOFOL;  Surgeon: Midge Minium, MD;  Location: [facility] ENDOSCOPY;  Service:               Endoscopy;  Laterality: N/A; No date: GALLBLADDER SURGERY 08/27/2015: KNEE ARTHROPLASTY; Left     Comment:  Procedure: COMPUTER ASSISTED TOTAL KNEE ARTHROPLASTY;                Surgeon: Donato Heinz, MD;  Location: [facility] ORS;                Service: Orthopedics;  Laterality: Left; No date: meniscal surgery;  Left  BMI    Body Mass Index: 41.54 kg/m      Reproductive/Obstetrics negative OB ROS                             Anesthesia Physical Anesthesia Plan  ASA: 3  Anesthesia Plan: Spinal   Post-op Pain Management:    Induction:   PONV Risk Score and Plan:   Airway Management Planned: Natural Airway and Nasal Cannula  Additional Equipment:   Intra-op Plan:   Post-operative Plan:   Informed Consent: I have reviewed the patients History and Physical, chart, labs and discussed the procedure including the risks, benefits and alternatives for the proposed anesthesia with the patient or authorized representative who has indicated his/her understanding and acceptance.     Dental Advisory Given  Plan Discussed with: Anesthesiologist, CRNA and Surgeon  Anesthesia Plan Comments: (Patient reports no bleeding problems and no anticoagulant use.  Plan for spinal with backup GA  Patient consented for risks of anesthesia including but not limited to:  - adverse reactions to medications - damage to eyes, teeth, lips or other oral mucosa - nerve damage due to positioning  - risk of bleeding, infection and or nerve damage from spinal that could lead to paralysis - risk of headache or failed spinal - damage to teeth, lips or other oral mucosa - sore throat  or hoarseness - damage to heart, brain, nerves, lungs, other parts of body or loss of life  Patient voiced understanding and assent.)       Anesthesia Quick Evaluation

## 2023-09-02 NOTE — Anesthesia Procedure Notes (Signed)
 Spinal  Patient location during procedure: OR Start time: 09/02/2023 7:25 AM End time: 09/02/2023 7:30 AM Reason for block: surgical anesthesia Staffing Performed: resident/CRNA  Anesthesiologist: Landry Kamath, Cleda Mccreedy, MD Resident/CRNA: Rich Brave, CRNA Performed by: Rosaria Ferries, MD Authorized by: Rosaria Ferries, MD   Preanesthetic Checklist Completed: patient identified, IV checked, site marked, risks and benefits discussed, surgical consent, monitors and equipment checked, pre-op evaluation and timeout performed Spinal Block Patient position: sitting Prep: ChloraPrep Patient monitoring: heart rate, continuous pulse ox, blood pressure and cardiac monitor Approach: midline Location: L3-4 Injection technique: single-shot Needle Needle type: Whitacre and Introducer  Needle gauge: 24 G Needle length: 9 cm Assessment Sensory level: T10 Events: CSF return Additional Notes Sterile aseptic technique used throughout the procedure.  Negative paresthesia. Negative blood return. Positive free-flowing CSF. Expiration date of kit checked and confirmed. Patient tolerated procedure well, without complications.

## 2023-09-02 NOTE — Anesthesia Procedure Notes (Deleted)
 Spinal  Patient location during procedure: OR Start time: 09/02/2023 7:30 AM End time: 09/02/2023 7:38 AM Staffing Performed: resident/CRNA  Anesthesiologist: Piscitello, Cleda Mccreedy, MD Resident/CRNA: Rich Brave, CRNA Performed by: Rich Brave, CRNA Authorized by: Rosaria Ferries, MD   Preanesthetic Checklist Completed: patient identified, IV checked, site marked, risks and benefits discussed, surgical consent, monitors and equipment checked, pre-op evaluation and timeout performed Spinal Block Patient position: sitting Prep: ChloraPrep Patient monitoring: heart rate, continuous pulse ox, blood pressure and cardiac monitor Approach: midline Location: L3-4 Injection technique: single-shot Needle Needle type: Pencan  Needle gauge: 24 G Needle length: 10 cm Needle insertion depth: 3.5 cm Assessment Sensory level: T10

## 2023-09-03 ENCOUNTER — Encounter: Payer: Self-pay | Admitting: Orthopedic Surgery

## 2023-09-03 DIAGNOSIS — M1711 Unilateral primary osteoarthritis, right knee: Secondary | ICD-10-CM | POA: Diagnosis not present

## 2023-09-03 MED ORDER — CELECOXIB 200 MG PO CAPS: 200.0000 mg | ORAL_CAPSULE | Freq: Two times a day (BID) | ORAL | 1 refills | Status: AC

## 2023-09-03 MED ORDER — TRAMADOL HCL 50 MG PO TABS
50.0000 mg | ORAL_TABLET | ORAL | 0 refills | Status: AC | PRN
Start: 2023-09-03 — End: ?

## 2023-09-03 MED ORDER — OXYCODONE HCL 5 MG PO TABS: 5.0000 mg | ORAL_TABLET | ORAL | 0 refills | Status: AC | PRN

## 2023-09-03 MED ORDER — ASPIRIN 81 MG PO CHEW: 81.0000 mg | CHEWABLE_TABLET | Freq: Two times a day (BID) | ORAL | Status: AC

## 2023-09-03 MED ORDER — ACETAMINOPHEN 10 MG/ML IV SOLN
INTRAVENOUS | Status: AC
  Filled 2023-09-03: qty 100

## 2023-09-03 NOTE — Anesthesia Postprocedure Evaluation (Signed)
 Anesthesia Post Note  Patient: Shannon Blake  Procedure(s) Performed: ARTHROPLASTY, KNEE, TOTAL, USING IMAGELESS COMPUTER-ASSISTED NAVIGATION (Right: Knee)  Patient location during evaluation: Nursing Unit Anesthesia Type: Spinal Level of consciousness: oriented and awake and alert Pain management: pain level controlled Vital Signs Assessment: post-procedure vital signs reviewed and stable Respiratory status: spontaneous breathing and respiratory function stable Cardiovascular status: blood pressure returned to baseline and stable Postop Assessment: no headache, no backache and no apparent nausea or vomiting Anesthetic complications: no   No notable events documented.   Last Vitals:  Vitals:   09/03/23 0416 09/03/23 0726  BP: 121/64 138/68  Pulse: (!) 58 (!) 59  Resp: 16 15  Temp: 36.6 C 36.7 C  SpO2: 98% 96%    Last Pain:  Vitals:   09/03/23 0726  TempSrc: Oral  PainSc: 0-No pain                 Elisabeth Pigeon

## 2023-09-03 NOTE — Progress Notes (Addendum)
 Physical Therapy Treatment Patient Details Name: Shannon Blake MRN: [id number] DOB: 05/13/1956 Today's Date: 09/03/2023   History of Present Illness Pt is s/p R TKA on 09/02/23.  Pt has PMH including: arthritis, hyperlipidemia, HTN, and L TKA.    PT Comments  Pt was long sitting in bed upon arrival. She is A and O x 4 and extremely motivated and pleasant. She demonstrated safe performance of getting OOB, standing to RW, and ambulating > 200 ft without LOB or sfaety concerns. Also able to safely perform ascending/descending stairs to simulate home entry. Pt is cleared from an acute PT standpoint for safe DC home with HHPT to follow.    If plan is discharge home, recommend the following: A little help with walking and/or transfers;A little help with bathing/dressing/bathroom;Help with stairs or ramp for entrance;Assist for transportation     Equipment Recommendations  None recommended by PT       Precautions / Restrictions Precautions Precautions: Knee Precaution Booklet Issued: Yes (comment) Recall of Precautions/Restrictions: Intact Restrictions Weight Bearing Restrictions Per Provider Order: Yes RLE Weight Bearing Per Provider Order: Weight bearing as tolerated     Mobility  Bed Mobility Overal bed mobility: Modified Independent   Transfers Overall transfer level: Modified independent   Ambulation/Gait Ambulation/Gait assistance: Modified independent (Device/Increase time)      Stairs Stairs: Yes Stairs assistance: Supervision Stair Management: One rail Left, Step to pattern, Forwards Number of Stairs: 4          Communication    Cognition Arousal: Alert Behavior During Therapy: WFL for tasks assessed/performed   PT - Cognitive impairments: No apparent impairments      Cueing    Exercises Total Joint Exercises Goniometric ROM: 0-104        Pertinent Vitals/Pain Pain Assessment Pain Assessment: 0-10 Pain Score: 3  Pain Location: R Knee Pain  Intervention(s): Limited activity within patient's tolerance, Monitored during session, Premedicated before session, Repositioned, Ice applied     PT Goals (current goals can now be found in the care plan section) Acute Rehab PT Goals Patient Stated Goal: go home Progress towards PT goals: Progressing toward goals    Frequency    BID       AM-PAC PT "6 Clicks" Mobility   Outcome Measure  Help needed turning from your back to your side while in a flat bed without using bedrails?: None Help needed moving from lying on your back to sitting on the side of a flat bed without using bedrails?: None Help needed moving to and from a bed to a chair (including a wheelchair)?: None Help needed standing up from a chair using your arms (e.g., wheelchair or bedside chair)?: None Help needed to walk in hospital room?: A Little Help needed climbing 3-5 steps with a railing? : A Little 6 Click Score: 22    End of Session   Activity Tolerance: Patient tolerated treatment well Patient left: in chair;with call bell/phone within reach;with family/visitor present Nurse Communication: Mobility status PT Visit Diagnosis: Muscle weakness (generalized) (M62.81);Difficulty in walking, not elsewhere classified (R26.2);Dizziness and giddiness (R42);Pain Pain - Right/Left: Right Pain - part of body: Knee     Time: 0803-0826 PT Time Calculation (min) (ACUTE ONLY): 23 min  Charges:    $Gait Training: 8-22 mins $Therapeutic Activity: 8-22 mins PT General Charges $$ ACUTE PT VISIT: 1 Visit                     Jetta Lout PTA 09/03/23, 8:43  AM

## 2023-09-03 NOTE — Plan of Care (Signed)
  Problem: Education: Goal: Knowledge of the prescribed therapeutic regimen will improve 09/03/2023 0754 by Brynda Peon, LPN Outcome: Progressing 09/03/2023 0754 by Brynda Peon, LPN Outcome: Progressing   Problem: Activity: Goal: Ability to avoid complications of mobility impairment will improve 09/03/2023 0754 by Brynda Peon, LPN Outcome: Progressing 09/03/2023 0754 by Brynda Peon, LPN Outcome: Progressing   Problem: Clinical Measurements: Goal: Postoperative complications will be avoided or minimized Outcome: Progressing   Problem: Pain Management: Goal: Pain level will decrease with appropriate interventions Outcome: Progressing   Problem: Skin Integrity: Goal: Will show signs of wound healing 09/03/2023 0754 by Brynda Peon, LPN Outcome: Progressing 09/03/2023 0754 by Brynda Peon, LPN Outcome: Progressing

## 2023-09-03 NOTE — Evaluation (Signed)
 Occupational Therapy Evaluation Patient Details Name: Shannon Blake MRN: [id number] DOB: Jan 14, 1956 Today's Date: 09/03/2023   History of Present Illness   Pt is s/p R TKA on 09/02/23.  Pt has PMH including: arthritis, hyperlipidemia, HTN, and L TKA.     Clinical Impressions Upon entering the room, pt supine in bed with supportive spouse in room. Pt reports having fully dressed self prior to therapist arrival. OT reviewed polar care, TED hose, and self care techniques with pt who verbalized and demonstrates understanding. Pt lives at home Ind with spouse at baseline. He will be able to provide assistance as needed. Discussed home set up and equipment and pt feels confident about discharge. All education completed. OT to complete orders at this time.      If plan is discharge home, recommend the following:          Equipment Recommendations   None recommended by OT      Precautions/Restrictions   Precautions Precautions: Knee Restrictions Weight Bearing Restrictions Per Provider Order: Yes RLE Weight Bearing Per Provider Order: Weight bearing as tolerated     Mobility Bed Mobility Overal bed mobility: Modified Independent                  Transfers                          Balance Overall balance assessment: Modified Independent                                         ADL either performed or assessed with clinical judgement   ADL Overall ADL's : Modified independent                                       General ADL Comments: Pt with great ROM and able to assist with donning TED hose and socks on operated LE without assistance. Pt fully dressed self prior to therapist arrival without assistance.     Vision Patient Visual Report: No change from baseline              Pertinent Vitals/Pain Pain Assessment Pain Assessment: No/denies pain     Extremity/Trunk Assessment Upper Extremity Assessment Upper  Extremity Assessment: Overall WFL for tasks assessed           Communication Communication Communication: No apparent difficulties   Cognition Arousal: Alert Behavior During Therapy: WFL for tasks assessed/performed Cognition: No apparent impairments                               Following commands: Intact                  Home Living Family/patient expects to be discharged to:: Private residence Living Arrangements: Spouse/significant other Available Help at Discharge: Family;Available 24 hours/day Type of Home: House Home Access: Stairs to enter [employer] of Steps: 3 Entrance Stairs-Rails: Left Home Layout: One level     Bathroom Shower/Tub: Producer, television/film/video: Handicapped height Bathroom Accessibility: Yes   Home Equipment: Agricultural consultant (2 wheels);Cane - single point;BSC/3in1;Shower seat - built in;Grab bars - toilet;Grab bars - tub/shower          Prior Functioning/Environment Prior Level  of Function : Independent/Modified Independent                    OT Problem List:     OT Treatment/Interventions:        OT Goals(Current goals can be found in the care plan section)   Acute Rehab OT Goals Patient Stated Goal: to go home OT Goal Formulation: With patient/family Time For Goal Achievement: 09/03/23 Potential to Achieve Goals: Good   OT Frequency:          AM-PAC OT "6 Clicks" Daily Activity     Outcome Measure Help from another person eating meals?: None Help from another person taking care of personal grooming?: None Help from another person toileting, which includes using toliet, bedpan, or urinal?: None Help from another person bathing (including washing, rinsing, drying)?: None Help from another person to put on and taking off regular upper body clothing?: None Help from another person to put on and taking off regular lower body clothing?: None 6 Click Score: 24   End of Session Nurse  Communication: Mobility status  Activity Tolerance: Patient tolerated treatment well Patient left: in bed;with call bell/phone within reach;with family/visitor present                   Time: 1610-9604 OT Time Calculation (min): 14 min Charges:  OT General Charges $OT Visit: 1 Visit OT Evaluation $OT Eval Low Complexity: 1 Low  Jackquline Denmark, MS, OTR/L , CBIS ascom [phone]  09/03/23, 9:50 AM

## 2023-09-03 NOTE — Progress Notes (Signed)
 DISCHARGE NOTE:    Pt discharged with IV removed education given. Pt and Pt's husband voice no questions or concerns at this time. Pt given 2 honeycomb dressings, medication scripts, and both TED hose on and in place. Pt also given CHG soap for bathing. Pt wheeled down to medical mall entrance by staff and pt's husband provided transportation.

## 2023-09-03 NOTE — Plan of Care (Signed)
?  Problem: Education: ?Goal: Knowledge of the prescribed therapeutic regimen will improve ?Outcome: Progressing ?  ?Problem: Activity: ?Goal: Ability to avoid complications of mobility impairment will improve ?Outcome: Progressing ?  ?Problem: Pain Management: ?Goal: Pain level will decrease with appropriate interventions ?Outcome: Progressing ?  ?Problem: Skin Integrity: ?Goal: Will show signs of wound healing ?Outcome: Progressing ?  ?

## 2023-10-22 ENCOUNTER — Other Ambulatory Visit: Payer: Self-pay | Admitting: Family Medicine

## 2023-10-22 DIAGNOSIS — Z1231 Encounter for screening mammogram for malignant neoplasm of breast: Secondary | ICD-10-CM

## 2023-11-04 ENCOUNTER — Other Ambulatory Visit: Payer: Self-pay | Admitting: Family Medicine

## 2023-11-04 DIAGNOSIS — I10 Essential (primary) hypertension: Secondary | ICD-10-CM

## 2023-11-05 ENCOUNTER — Ambulatory Visit
Admission: RE | Admit: 2023-11-05 | Discharge: 2023-11-05 | Disposition: A | Discharge status: Home / Self Care | Source: Ambulatory Visit | Attending: Family Medicine | Admitting: Family Medicine

## 2023-11-05 DIAGNOSIS — Z1231 Encounter for screening mammogram for malignant neoplasm of breast: Secondary | ICD-10-CM | POA: Insufficient documentation

## 2023-11-06 ENCOUNTER — Ambulatory Visit: Payer: Self-pay | Admitting: Family Medicine
